# Patient Record
Sex: Female | Born: 1985 | Race: Black or African American | Hispanic: No | Marital: Single | Smoking: Never smoker
Health system: Southern US, Community
[De-identification: ages and names within clinical notes are randomized; demographics above are authoritative.]

## PROBLEM LIST (undated history)

## (undated) ENCOUNTER — Inpatient Hospital Stay: Payer: Self-pay

## (undated) DIAGNOSIS — R51 Headache: Secondary | ICD-10-CM

## (undated) DIAGNOSIS — F410 Panic disorder [episodic paroxysmal anxiety] without agoraphobia: Secondary | ICD-10-CM

## (undated) DIAGNOSIS — Z87440 Personal history of urinary (tract) infections: Secondary | ICD-10-CM

## (undated) DIAGNOSIS — K429 Umbilical hernia without obstruction or gangrene: Secondary | ICD-10-CM

## (undated) DIAGNOSIS — R519 Headache, unspecified: Secondary | ICD-10-CM

## (undated) HISTORY — PX: DILATION AND CURETTAGE OF UTERUS: SHX78

## (undated) HISTORY — DX: Personal history of urinary (tract) infections: Z87.440

---

## 2006-09-04 ENCOUNTER — Emergency Department: Payer: Self-pay | Admitting: Emergency Medicine

## 2006-09-07 ENCOUNTER — Ambulatory Visit: Payer: Self-pay | Admitting: Emergency Medicine

## 2008-07-09 ENCOUNTER — Observation Stay: Payer: Self-pay | Admitting: Obstetrics and Gynecology

## 2008-11-30 ENCOUNTER — Inpatient Hospital Stay: Payer: Self-pay | Admitting: Obstetrics and Gynecology

## 2008-12-30 ENCOUNTER — Emergency Department: Payer: Self-pay | Admitting: Emergency Medicine

## 2009-03-05 ENCOUNTER — Emergency Department: Payer: Self-pay | Admitting: Internal Medicine

## 2009-03-05 ENCOUNTER — Ambulatory Visit: Payer: Self-pay | Admitting: Internal Medicine

## 2011-12-21 ENCOUNTER — Emergency Department: Payer: Self-pay | Admitting: Emergency Medicine

## 2013-02-16 ENCOUNTER — Ambulatory Visit: Payer: Self-pay | Admitting: General Practice

## 2013-04-25 ENCOUNTER — Ambulatory Visit: Payer: Self-pay | Admitting: Gastroenterology

## 2013-08-30 ENCOUNTER — Emergency Department: Payer: Self-pay | Admitting: Emergency Medicine

## 2014-01-13 ENCOUNTER — Emergency Department: Payer: Self-pay | Admitting: Emergency Medicine

## 2015-02-08 ENCOUNTER — Emergency Department
Admission: EM | Admit: 2015-02-08 | Discharge: 2015-02-08 | Disposition: A | Discharge status: Home / Self Care | Payer: No Typology Code available for payment source | Attending: Emergency Medicine | Admitting: Emergency Medicine

## 2015-02-08 DIAGNOSIS — Y9389 Activity, other specified: Secondary | ICD-10-CM | POA: Insufficient documentation

## 2015-02-08 DIAGNOSIS — S6991XA Unspecified injury of right wrist, hand and finger(s), initial encounter: Secondary | ICD-10-CM | POA: Insufficient documentation

## 2015-02-08 DIAGNOSIS — S0990XA Unspecified injury of head, initial encounter: Secondary | ICD-10-CM | POA: Insufficient documentation

## 2015-02-08 DIAGNOSIS — Y998 Other external cause status: Secondary | ICD-10-CM | POA: Diagnosis not present

## 2015-02-08 DIAGNOSIS — Y92410 Unspecified street and highway as the place of occurrence of the external cause: Secondary | ICD-10-CM | POA: Insufficient documentation

## 2015-02-08 MED ORDER — METHOCARBAMOL 750 MG PO TABS: 1500.0000 mg | ORAL_TABLET | Freq: Four times a day (QID) | ORAL | Status: DC

## 2015-02-08 MED ORDER — IBUPROFEN 800 MG PO TABS
800.0000 mg | ORAL_TABLET | Freq: Three times a day (TID) | ORAL | Status: DC | PRN
Start: 2015-02-08 — End: 2015-06-27

## 2015-02-08 MED ORDER — OXYCODONE-ACETAMINOPHEN 5-325 MG PO TABS: 1.0000 | ORAL_TABLET | ORAL | Status: AC | PRN

## 2015-02-08 NOTE — Discharge Instructions (Signed)
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
## 2015-02-08 NOTE — ED Notes (Signed)
Patient with no complaints at this time. Respirations even and unlabored. Skin warm/dry. Discharge instructions reviewed with patient at this time. Patient given opportunity to voice concerns/ask questions. Patient discharged at this time and left Emergency Department with steady gait.   

## 2015-02-12 ENCOUNTER — Encounter: Payer: Self-pay | Admitting: Emergency Medicine

## 2015-02-12 ENCOUNTER — Emergency Department
Admission: EM | Admit: 2015-02-12 | Discharge: 2015-02-12 | Disposition: A | Discharge status: Home / Self Care | Payer: No Typology Code available for payment source | Attending: Emergency Medicine | Admitting: Emergency Medicine

## 2015-02-12 ENCOUNTER — Emergency Department: Payer: No Typology Code available for payment source

## 2015-02-12 DIAGNOSIS — R51 Headache: Secondary | ICD-10-CM | POA: Diagnosis present

## 2015-02-12 DIAGNOSIS — Z791 Long term (current) use of non-steroidal anti-inflammatories (NSAID): Secondary | ICD-10-CM | POA: Diagnosis not present

## 2015-02-12 DIAGNOSIS — G44209 Tension-type headache, unspecified, not intractable: Secondary | ICD-10-CM | POA: Insufficient documentation

## 2015-02-12 MED ORDER — HYDROCODONE-ACETAMINOPHEN 5-325 MG PO TABS: 1.0000 | ORAL_TABLET | ORAL | Status: DC | PRN

## 2015-02-12 MED ORDER — CYCLOBENZAPRINE HCL 10 MG PO TABS: 10.0000 mg | ORAL_TABLET | Freq: Three times a day (TID) | ORAL | Status: DC | PRN

## 2015-02-12 MED ORDER — NAPROXEN SODIUM 550 MG PO TABS: 550.0000 mg | ORAL_TABLET | Freq: Two times a day (BID) | ORAL | Status: DC
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
## 2015-02-12 NOTE — Discharge Instructions (Signed)
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
## 2015-06-27 ENCOUNTER — Encounter: Payer: Self-pay | Admitting: *Deleted

## 2015-06-27 ENCOUNTER — Encounter: Payer: Self-pay | Admitting: Certified Nurse Midwife

## 2015-06-27 ENCOUNTER — Other Ambulatory Visit (HOSPITAL_COMMUNITY)
Admission: RE | Admit: 2015-06-27 | Discharge: 2015-06-27 | Disposition: A | Discharge status: Home / Self Care | Payer: Medicaid Other | Source: Ambulatory Visit | Attending: Certified Nurse Midwife | Admitting: Certified Nurse Midwife

## 2015-06-27 ENCOUNTER — Ambulatory Visit (INDEPENDENT_AMBULATORY_CARE_PROVIDER_SITE_OTHER): Payer: Medicaid Other | Admitting: Certified Nurse Midwife

## 2015-06-27 VITALS — BP 117/70 | HR 77 | Wt 148.0 lb

## 2015-06-27 DIAGNOSIS — Z3481 Encounter for supervision of other normal pregnancy, first trimester: Secondary | ICD-10-CM

## 2015-06-27 DIAGNOSIS — Z01419 Encounter for gynecological examination (general) (routine) without abnormal findings: Secondary | ICD-10-CM | POA: Diagnosis not present

## 2015-06-27 DIAGNOSIS — Z113 Encounter for screening for infections with a predominantly sexual mode of transmission: Secondary | ICD-10-CM | POA: Diagnosis present

## 2015-06-27 DIAGNOSIS — Z3491 Encounter for supervision of normal pregnancy, unspecified, first trimester: Secondary | ICD-10-CM

## 2015-06-27 LAB — HM PAP SMEAR: HM PAP: NORMAL

## 2015-06-27 NOTE — Progress Notes (Signed)
Bedside ultrasound shows 7224w3d fetus with heartbeat today.

## 2015-06-27 NOTE — Progress Notes (Signed)
   Subjective:    Pamela [name] is a G3P1011 3261w2d being seen today for her first obstetrical visit.  Her obstetrical history is significant for previous c/s. Patient does intend to breast feed. Pregnancy history fully reviewed.  Patient reports no complaints.  Filed Vitals:   06/27/15 1334  BP: 117/70  Pulse: 77  Weight: 148 lb (67.132 kg)    HISTORY: OB History  Gravida Para Term Preterm AB SAB TAB Ectopic Multiple Living  3 1 1  1  1   1     # Outcome Date GA Lbr Len/2nd Weight Sex Delivery Anes PTL Lv  3 Current           2 Term 12/01/08 6017w0d  8 lb 11 oz (3.941 kg) F CS-LTranv EPI  Y  1 TAB              History reviewed. No pertinent past medical history. Past Surgical History  Procedure Laterality Date  . Cesarean section     History reviewed. No pertinent family history.   Exam    Uterus:     Pelvic Exam:    Perineum: No Hemorrhoids   Vulva: normal   Vagina:  normal mucosa   pH:    Cervix: multiparous appearance   Adnexa: normal adnexa   Bony Pelvis: gynecoid  System: Breast:  normal appearance, no masses or tenderness   Skin: normal coloration and turgor, no rashes    Neurologic: oriented, normal   Extremities: normal strength, tone, and muscle mass   HEENT    Mouth/Teeth mucous membranes moist, pharynx normal without lesions and dental hygiene good   Neck supple and no masses   Cardiovascular: regular rate and rhythm   Respiratory:  appears well, vitals normal, no respiratory distress, acyanotic, normal RR, ear and throat exam is normal, neck free of mass or lymphadenopathy, chest clear, no wheezing, crepitations, rhonchi, normal symmetric air entry   Abdomen: soft, non-tender; bowel sounds normal; no masses,  no organomegaly   Urinary: urethral meatus normal      Assessment:    Pregnancy: G3P1011 There are no active problems to display for this patient.    Previous C/S Supervision of other normal pregnancy Considering TOLAC   Plan:     Initial labs drawn. Prenatal vitamins. Problem list reviewed and updated. Genetic Screening discussed First Screen: requested.  Ultrasound discussed; fetal survey: requested.  Follow up in 4 weeks. 50% of 30 min visit spent on counseling and coordination of care.  Sign up for Baby Scripts   MedtronicClemmons,Hooria Gasparini Grissett 06/27/2015

## 2015-06-27 NOTE — Patient Instructions (Signed)
First Trimester of Pregnancy The first trimester of pregnancy is from week 1 until the end of week 12 (months 1 through 3). A week after a sperm fertilizes an egg, the egg will implant on the wall of the uterus. This embryo will begin to develop into a baby. Genes from you and your partner are forming the baby. The female genes determine whether the baby is a boy or a girl. At 6-8 weeks, the eyes and face are formed, and the heartbeat can be seen on ultrasound. At the end of 12 weeks, all the baby's organs are formed.  Now that you are pregnant, you will want to do everything you can to have a healthy baby. Two of the most important things are to get good prenatal care and to follow your health care provider's instructions. Prenatal care is all the medical care you receive before the baby's birth. This care will help prevent, find, and treat any problems during the pregnancy and childbirth. BODY CHANGES Your body goes through many changes during pregnancy. The changes vary from woman to woman.   You may gain or lose a couple of pounds at first.  You may feel sick to your stomach (nauseous) and throw up (vomit). If the vomiting is uncontrollable, call your health care provider.  You may tire easily.  You may develop headaches that can be relieved by medicines approved by your health care provider.  You may urinate more often. Painful urination may mean you have a bladder infection.  You may develop heartburn as a result of your pregnancy.  You may develop constipation because certain hormones are causing the muscles that push waste through your intestines to slow down.  You may develop hemorrhoids or swollen, bulging veins (varicose veins).  Your breasts may begin to grow larger and become tender. Your nipples may stick out more, and the tissue that surrounds them (areola) may become darker.  Your gums may bleed and may be sensitive to brushing and flossing.  Dark spots or blotches (chloasma,  mask of pregnancy) may develop on your face. This will likely fade after the baby is born.  Your menstrual periods will stop.  You may have a loss of appetite.  You may develop cravings for certain kinds of food.  You may have changes in your emotions from day to day, such as being excited to be pregnant or being concerned that something may go wrong with the pregnancy and baby.  You may have more vivid and strange dreams.  You may have changes in your hair. These can include thickening of your hair, rapid growth, and changes in texture. Some women also have hair loss during or after pregnancy, or hair that feels dry or thin. Your hair will most likely return to normal after your baby is born. WHAT TO EXPECT AT YOUR PRENATAL VISITS During a routine prenatal visit:  You will be weighed to make sure you and the baby are growing normally.  Your blood pressure will be taken.  Your abdomen will be measured to track your baby's growth.  The fetal heartbeat will be listened to starting around week 10 or 12 of your pregnancy.  Test results from any previous visits will be discussed. Your health care provider may ask you:  How you are feeling.  If you are feeling the baby move.  If you have had any abnormal symptoms, such as leaking fluid, bleeding, severe headaches, or abdominal cramping.  If you are using any tobacco products,   including cigarettes, chewing tobacco, and electronic cigarettes.  If you have any questions. Other tests that may be performed during your first trimester include:  Blood tests to find your blood type and to check for the presence of any previous infections. They will also be used to check for low iron levels (anemia) and Rh antibodies. Later in the pregnancy, blood tests for diabetes will be done along with other tests if problems develop.  Urine tests to check for infections, diabetes, or protein in the urine.  An ultrasound to confirm the proper growth  and development of the baby.  An amniocentesis to check for possible genetic problems.  Fetal screens for spina bifida and Down syndrome.  You may need other tests to make sure you and the baby are doing well.  HIV (human immunodeficiency virus) testing. Routine prenatal testing includes screening for HIV, unless you choose not to have this test. HOME CARE INSTRUCTIONS  Medicines  Follow your health care provider's instructions regarding medicine use. Specific medicines may be either safe or unsafe to take during pregnancy.  Take your prenatal vitamins as directed.  If you develop constipation, try taking a stool softener if your health care provider approves. Diet  Eat regular, well-balanced meals. Choose a variety of foods, such as meat or vegetable-based protein, fish, milk and low-fat dairy products, vegetables, fruits, and whole grain breads and cereals. Your health care provider will help you determine the amount of weight gain that is right for you.  Avoid raw meat and uncooked cheese. These carry germs that can cause birth defects in the baby.  Eating four or five small meals rather than three large meals a day may help relieve nausea and vomiting. If you start to feel nauseous, eating a few soda crackers can be helpful. Drinking liquids between meals instead of during meals also seems to help nausea and vomiting.  If you develop constipation, eat more high-fiber foods, such as fresh vegetables or fruit and whole grains. Drink enough fluids to keep your urine clear or pale yellow. Activity and Exercise  Exercise only as directed by your health care provider. Exercising will help you:  Control your weight.  Stay in shape.  Be prepared for labor and delivery.  Experiencing pain or cramping in the lower abdomen or low back is a good sign that you should stop exercising. Check with your health care provider before continuing normal exercises.  Try to avoid standing for long  periods of time. Move your legs often if you must stand in one place for a long time.  Avoid heavy lifting.  Wear low-heeled shoes, and practice good posture.  You may continue to have sex unless your health care provider directs you otherwise. Relief of Pain or Discomfort  Wear a good support bra for breast tenderness.   Take warm sitz baths to soothe any pain or discomfort caused by hemorrhoids. Use hemorrhoid cream if your health care provider approves.   Rest with your legs elevated if you have leg cramps or low back pain.  If you develop varicose veins in your legs, wear support hose. Elevate your feet for 15 minutes, 3-4 times a day. Limit salt in your diet. Prenatal Care  Schedule your prenatal visits by the twelfth week of pregnancy. They are usually scheduled monthly at first, then more often in the last 2 months before delivery.  Write down your questions. Take them to your prenatal visits.  Keep all your prenatal visits as directed by your   health care provider. Safety  Wear your seat belt at all times when driving.  Make a list of emergency phone numbers, including numbers for family, friends, the hospital, and police and fire departments. General Tips  Ask your health care provider for a referral to a local prenatal education class. Begin classes no later than at the beginning of month 6 of your pregnancy.  Ask for help if you have counseling or nutritional needs during pregnancy. Your health care provider can offer advice or refer you to specialists for help with various needs.  Do not use hot tubs, steam rooms, or saunas.  Do not douche or use tampons or scented sanitary pads.  Do not cross your legs for long periods of time.  Avoid cat litter boxes and soil used by cats. These carry germs that can cause birth defects in the baby and possibly loss of the fetus by miscarriage or stillbirth.  Avoid all smoking, herbs, alcohol, and medicines not prescribed by  your health care provider. Chemicals in these affect the formation and growth of the baby.  Do not use any tobacco products, including cigarettes, chewing tobacco, and electronic cigarettes. If you need help quitting, ask your health care provider. You may receive counseling support and other resources to help you quit.  Schedule a dentist appointment. At home, brush your teeth with a soft toothbrush and be gentle when you floss. SEEK MEDICAL CARE IF:   You have dizziness.  You have mild pelvic cramps, pelvic pressure, or nagging pain in the abdominal area.  You have persistent nausea, vomiting, or diarrhea.  You have a bad smelling vaginal discharge.  You have pain with urination.  You notice increased swelling in your face, hands, legs, or ankles. SEEK IMMEDIATE MEDICAL CARE IF:   You have a fever.  You are leaking fluid from your vagina.  You have spotting or bleeding from your vagina.  You have severe abdominal cramping or pain.  You have rapid weight gain or loss.  You vomit blood or material that looks like coffee grounds.  You are exposed to German measles and have never had them.  You are exposed to fifth disease or chickenpox.  You develop a severe headache.  You have shortness of breath.  You have any kind of trauma, such as from a fall or a car accident.   This information is not intended to replace advice given to you by your health care provider. Make sure you discuss any questions you have with your health care provider.   Document Released: 08/12/2001 Document Revised: 09/08/2014 Document Reviewed: 06/28/2013 [employer]vier Interactive Patient Education 2016 [employer]  

## 2015-06-28 LAB — PRENATAL PROFILE (SOLSTAS)
Antibody Screen: NEGATIVE
Basophils Absolute: 0 10*3/uL (ref 0.0–0.1)
Basophils Relative: 0 % (ref 0–1)
Eosinophils Absolute: 0.1 10*3/uL (ref 0.0–0.7)
Eosinophils Relative: 1 % (ref 0–5)
HCT: 37.9 % (ref 36.0–46.0)
HIV 1&2 Ab, 4th Generation: NONREACTIVE
Hemoglobin: 12.2 g/dL (ref 12.0–15.0)
Hepatitis B Surface Ag: NEGATIVE
Lymphocytes Relative: 30 % (ref 12–46)
Lymphs Abs: 2.4 10*3/uL (ref 0.7–4.0)
MCH: 29.5 pg (ref 26.0–34.0)
MCHC: 32.2 g/dL (ref 30.0–36.0)
MCV: 91.5 fL (ref 78.0–100.0)
MPV: 9.7 fL (ref 8.6–12.4)
Monocytes Absolute: 0.7 10*3/uL (ref 0.1–1.0)
Monocytes Relative: 9 % (ref 3–12)
Neutro Abs: 4.9 10*3/uL (ref 1.7–7.7)
Neutrophils Relative %: 60 % (ref 43–77)
Platelets: 266 10*3/uL (ref 150–400)
RBC: 4.14 MIL/uL (ref 3.87–5.11)
RDW: 13.3 % (ref 11.5–15.5)
Rh Type: POSITIVE
Rubella: 6.25 Index — ABNORMAL HIGH (ref <0.90)
WBC: 8.1 10*3/uL (ref 4.0–10.5)

## 2015-06-28 LAB — CULTURE, OB URINE
Colony Count: NO GROWTH
Organism ID, Bacteria: NO GROWTH

## 2015-06-29 LAB — CYTOLOGY - PAP

## 2015-07-25 ENCOUNTER — Inpatient Hospital Stay (HOSPITAL_COMMUNITY)
Admission: AD | Admit: 2015-07-25 | Discharge: 2015-07-25 | Disposition: A | Discharge status: Home / Self Care | Payer: Medicaid Other | Source: Ambulatory Visit | Attending: Family Medicine | Admitting: Family Medicine

## 2015-07-25 ENCOUNTER — Inpatient Hospital Stay (HOSPITAL_COMMUNITY): Payer: Medicaid Other

## 2015-07-25 ENCOUNTER — Encounter (HOSPITAL_COMMUNITY): Payer: Self-pay | Admitting: *Deleted

## 2015-07-25 ENCOUNTER — Ambulatory Visit (INDEPENDENT_AMBULATORY_CARE_PROVIDER_SITE_OTHER): Payer: Medicaid Other | Admitting: Advanced Practice Midwife

## 2015-07-25 VITALS — BP 124/73 | HR 69 | Wt 146.0 lb

## 2015-07-25 DIAGNOSIS — Z3480 Encounter for supervision of other normal pregnancy, unspecified trimester: Secondary | ICD-10-CM | POA: Insufficient documentation

## 2015-07-25 DIAGNOSIS — IMO0002 Reserved for concepts with insufficient information to code with codable children: Secondary | ICD-10-CM

## 2015-07-25 DIAGNOSIS — O021 Missed abortion: Secondary | ICD-10-CM | POA: Diagnosis not present

## 2015-07-25 DIAGNOSIS — R109 Unspecified abdominal pain: Secondary | ICD-10-CM | POA: Diagnosis present

## 2015-07-25 DIAGNOSIS — Z3481 Encounter for supervision of other normal pregnancy, first trimester: Secondary | ICD-10-CM

## 2015-07-25 HISTORY — DX: Headache, unspecified: R51.9

## 2015-07-25 HISTORY — DX: Umbilical hernia without obstruction or gangrene: K42.9

## 2015-07-25 HISTORY — DX: Headache: R51

## 2015-07-25 LAB — CBC
HCT: 39.2 % (ref 36.0–46.0)
Hemoglobin: 13.3 g/dL (ref 12.0–15.0)
MCH: 30.6 pg (ref 26.0–34.0)
MCHC: 33.9 g/dL (ref 30.0–36.0)
MCV: 90.1 fL (ref 78.0–100.0)
PLATELETS: 243 10*3/uL (ref 150–400)
RBC: 4.35 MIL/uL (ref 3.87–5.11)
RDW: 13.6 % (ref 11.5–15.5)
WBC: 11.1 10*3/uL — ABNORMAL HIGH (ref 4.0–10.5)

## 2015-07-25 MED ORDER — HYDROCODONE-ACETAMINOPHEN 5-325 MG PO TABS: 1.0000 | ORAL_TABLET | Freq: Four times a day (QID) | ORAL | Status: DC | PRN

## 2015-07-25 MED ORDER — MISOPROSTOL 200 MCG PO TABS: 800.0000 ug | ORAL_TABLET | Freq: Once | ORAL | Status: DC

## 2015-07-25 MED ORDER — PROMETHAZINE HCL 12.5 MG PO TABS: 12.5000 mg | ORAL_TABLET | Freq: Four times a day (QID) | ORAL | Status: DC | PRN

## 2015-07-25 MED ORDER — IBUPROFEN 600 MG PO TABS: 600.0000 mg | ORAL_TABLET | Freq: Four times a day (QID) | ORAL | Status: DC | PRN

## 2015-07-25 NOTE — Discharge Instructions (Signed)
Incomplete Miscarriage A miscarriage is the sudden loss of an unborn baby (fetus) before the 20th week of pregnancy. In an incomplete miscarriage, parts of the fetus or placenta (afterbirth) remain in the body.  Having a miscarriage can be an emotional experience. Talk with your health care provider about any questions you may have about miscarrying, the grieving process, and your future pregnancy plans. CAUSES   Problems with the fetal chromosomes that make it impossible for the baby to develop normally. Problems with the baby's genes or chromosomes are most often the result of errors that occur by chance as the embryo divides and grows. The problems are not inherited from the parents.  Infection of the cervix or uterus.  Hormone problems.  Problems with the cervix, such as having an incompetent cervix. This is when the tissue in the cervix is not strong enough to hold the pregnancy.  Problems with the uterus, such as an abnormally shaped uterus, uterine fibroids, or congenital abnormalities.  Certain medical conditions.  Smoking, drinking alcohol, or taking illegal drugs.  Trauma. SYMPTOMS   Vaginal bleeding or spotting, with or without cramps or pain.  Pain or cramping in the abdomen or lower back.  Passing fluid, tissue, or blood clots from the vagina. DIAGNOSIS  Your health care provider will perform a physical exam. You may also have an ultrasound to confirm the miscarriage. Blood or urine tests may also be ordered. TREATMENT   Usually, a dilation and curettage (D&C) procedure is performed. During a D&C procedure, the cervix is widened (dilated) and any remaining fetal or placental tissue is gently removed from the uterus.  Antibiotic medicines are prescribed if there is an infection. Other medicines may be given to reduce the size of the uterus (contract) if there is a lot of bleeding.  If you have Rh negative blood and your baby was Rh positive, you will need a Rho (D)  immune globulin shot. This shot will protect any future baby from having Rh blood problems in future pregnancies.  You may be confined to bed rest. This means you should stay in bed and only get up to use the bathroom. HOME CARE INSTRUCTIONS   Rest as directed by your health care provider.  Restrict activity as directed by your health care provider. You may be allowed to continue light activity if curettage was not done but you require further treatment.  Keep track of the number of pads you use each day. Keep track of how soaked (saturated) they are. Record this information.  Do not  use tampons.  Do not douche or have sexual intercourse until approved by your health care provider.  Keep all follow-up appointments for reevaluation and continuing management.  Only take over-the-counter or prescription medicines for pain, fever, or discomfort as directed by your health care provider.  Take antibiotic medicine as directed by your health care provider. Make sure you finish it even if you start to feel better. SEEK IMMEDIATE MEDICAL CARE IF:   You experience severe cramps in your stomach, back, or abdomen.  You have an unexplained temperature (make sure to record these temperatures).  You pass large clots or tissue (save these for your health care provider to inspect).  Your bleeding increases.  You become light-headed, weak, or have fainting episodes. MAKE SURE YOU:   Understand these instructions.  Will watch your condition.  Will get help right away if you are not doing well or get worse.   This information is not intended to   replace advice given to you by your health care provider. Make sure you discuss any questions you have with your health care provider.   Document Released: 08/18/2005 Document Revised: 09/08/2014 Document Reviewed: 03/17/2013 Elsevier Interactive Patient Education 2016 [employer]vier [employer]  

## 2015-07-25 NOTE — MAU Provider Note (Signed)
History     CSN: [id number]  Arrival date and time: 07/25/15 1428   First Provider Initiated Contact with Patient 07/25/15 1511      Chief Complaint  Patient presents with  . Abdominal Pain   HPI Pamela [name] 29 y.o. [id number] @[redacted]w[redacted]d  presents for eval after a clinic visit today.  Her provider was unable to detect heart tones and she needs eval here for confirmation.  She denies vaginal bleeding, abdominal pain, fever.  She last ate at 1:10pm.  OB History    Gravida Para Term Preterm AB TAB SAB Ectopic Multiple Living   3 1 1  1 1    1       Past Medical History  Diagnosis Date  . Headache   . Umbilical hernia     Past Surgical History  Procedure Laterality Date  . Cesarean section      Family History  Problem Relation Age of Onset  . Alcohol abuse Neg Hx   . Arthritis Neg Hx   . Asthma Neg Hx   . Birth defects Neg Hx   . Cancer Neg Hx   . COPD Neg Hx   . Depression Neg Hx   . Diabetes Neg Hx   . Drug abuse Neg Hx   . Early death Neg Hx   . Hearing loss Neg Hx   . Heart disease Neg Hx   . Hyperlipidemia Neg Hx   . Hypertension Neg Hx   . Kidney disease Neg Hx   . Learning disabilities Neg Hx   . Mental illness Neg Hx   . Mental retardation Neg Hx   . Miscarriages / Stillbirths Neg Hx   . Stroke Neg Hx   . Vision loss Neg Hx   . Varicose Veins Neg Hx     Social History  Substance Use Topics  . Smoking status: Never Smoker   . Smokeless tobacco: None  . Alcohol Use: Yes    Allergies:  Allergies  Allergen Reactions  . Tramadol Itching    Prescriptions prior to admission  Medication Sig Dispense Refill Last Dose  . Prenatal Vit-Fe Fumarate-FA (MULTIVITAMIN-PRENATAL) 27-0.8 MG TABS tablet Take 1 tablet by mouth daily at 12 noon.   07/24/2015 at Unknown time    ROS Pertinent ROS in HPI.  All other systems are negative.    Physical Exam   Blood pressure 135/79, pulse 79, temperature 99.3 F (37.4 C), resp. rate 18, last menstrual period  04/30/2015.  Physical Exam  Constitutional: She is oriented to person, place, and time. She appears well-developed and well-nourished. No distress.  HENT:  Head: Normocephalic and atraumatic.  Cardiovascular: Normal rate and normal heart sounds.   Respiratory: Effort normal. No respiratory distress.  GI: Soft. Bowel sounds are normal. She exhibits no distension. There is no tenderness.  Musculoskeletal: Normal range of motion. She exhibits no edema.  Neurological: She is alert and oriented to person, place, and time.  Skin: Skin is warm and dry.  Psychiatric: She has a normal mood and affect. Her behavior is normal.   [place] Ob Comp Less 14 Wks  07/25/2015  CLINICAL DATA:  Intermittent abdominal pain. EXAM: OBSTETRIC <14 WK US AND TRANSVAGINAL OB US TECHNIQUE: Both transabdominal and transvaginal ultrasound examinations were performed for complete evaluation of the gestation as well as the maternal uterus, adnexal regions, and pelvic cul-de-sac. Transvaginal technique was performed to assess early pregnancy. COMPARISON:  None. FINDINGS: Intrauterine gestational sac: Visualized/normal in shape. Yolk sac:  Visualized Embryo:  Visualized Cardiac Activity: Not visualized Heart Rate:   bpm MSD: 45.8  mm   10 w   1  d CRL:  17.9  mm   8 w   2 d                  [place] EDC: 03/03/2016 Maternal uterus/adnexae: Small subchorionic hemorrhage. No free fluid or adnexal masses. Right corpus luteum cyst. IMPRESSION: Discordant mean sac size and crown-rump length. No fetal heart tones. Findings are suspicious but not yet definitive for failed pregnancy. Recommend follow-up US in 10-14 days for definitive diagnosis. This recommendation follows SRU consensus guidelines: Diagnostic Criteria for Nonviable Pregnancy Early in the First Trimester. Malva Limes Med 2013; 161:0960-45. Electronically Signed   By: Charlett Nose M.D.   On: 07/25/2015 16:15   US Ob Transvaginal  07/25/2015  CLINICAL DATA:  Intermittent abdominal pain.  EXAM: OBSTETRIC <14 WK [place] AND TRANSVAGINAL OB US TECHNIQUE: Both transabdominal and transvaginal ultrasound examinations were performed for complete evaluation of the gestation as well as the maternal uterus, adnexal regions, and pelvic cul-de-sac. Transvaginal technique was performed to assess early pregnancy. COMPARISON:  None. FINDINGS: Intrauterine gestational sac: Visualized/normal in shape. Yolk sac:  Visualized Embryo:  Visualized Cardiac Activity: Not visualized Heart Rate:   bpm MSD: 45.8  mm   10 w   1  d CRL:  17.9  mm   8 w   2 d                  [place] EDC: 03/03/2016 Maternal uterus/adnexae: Small subchorionic hemorrhage. No free fluid or adnexal masses. Right corpus luteum cyst. IMPRESSION: Discordant mean sac size and crown-rump length. No fetal heart tones. Findings are suspicious but not yet definitive for failed pregnancy. Recommend follow-up US in 10-14 days for definitive diagnosis. This recommendation follows SRU consensus guidelines: Diagnostic Criteria for Nonviable Pregnancy Early in the First Trimester. Malva Limes Med 2013; 409:8119-14. Electronically Signed   By: Charlett Nose M.D.   On: 07/25/2015 16:15   MAU Course  Procedures  MDM Previous u/s at [facility] office on 10/26 showed fetus at 7 weeks, 3 days with cardiac activity.  No cardiac activity in office today.   U/S ordered to confirm.  Fetus had not had sufficient growth and no cardiac activity.  Discussed with Dr. Shawnie Pons whom agrees this is failed pregnancy.  Pt to be offered expectant management vs. Cytotec.  Pt given opportunity to ask questions and decides for Cytotec.   CBC ordered to confirm hemodynamically stable.    Assessment and Plan  A: Missed AB  P: Discharge to home     Early Intrauterine Pregnancy Failure  _x__  Documented intrauterine pregnancy failure less than or equal to [redacted] weeks gestation  _x__  No serious current illness  ___  Baseline Hgb greater than or equal to 10g/dl  _x__  Patient has  easily accessible transportation to the hospital  _x__  Clear preference  _x__  Practitioner/physician deems patient reliable  _x__  Counseling by practitioner or physician  _n/a__  Rho-Gam given by RN if indicated  ___ Medication dispensed   __x_   Cytotec 800 mcg  _x_   Intravaginally by patient at home         __   Intravaginally by RN in MAU        __   Rectally by patient at home        __   Rectally by  RN in MAU  __x_  Ibuprofen 600 mg 1 tablet by mouth every 6 hours as needed #30  __x_  Hydrocodone/acetaminophen 5/325 mg by mouth every 4 to 6 hours as needed  _x__  Phenergan 12.5 mg by mouth every 4 hours as needed for nausea  F/u at [facility] office in 2 weeks Patient may return to MAU as needed or if her condition were to change or worsen   Bertram Denver 07/25/2015, 3:12 PM

## 2015-07-25 NOTE — Progress Notes (Signed)
No fetal heart tones by doppler, bedside US performed by RN and CNM, no visible heartbeat.  Pt sent to MAU for further evaluation.

## 2015-07-25 NOTE — MAU Note (Addendum)
C/o slight, intermittent abdominal pain throughout pregnancy; sent from Ob's office for u/s; pt in for regular check-up and no FHR was found; sent for u/s to confirm no FHR;

## 2015-07-30 ENCOUNTER — Telehealth: Payer: Self-pay | Admitting: *Deleted

## 2015-07-30 NOTE — Telephone Encounter (Signed)
Spoke to pt to follow-up to see how she was doing after her last visit in the office, pt states she has not passed anything naturally and that she is gonna contact the pharmacy today about getting the cytotec, wanted to see if it would be covered by her Medicaid and the cost of the medication.  Recommended her to contact pharmacy as soon as possible to take cytotec and call office after she started the medication so she could schedule a follow-up appt.  Pt acknowledged instructions.

## 2015-08-02 ENCOUNTER — Telehealth: Payer: Self-pay | Admitting: *Deleted

## 2015-08-02 DIAGNOSIS — O021 Missed abortion: Secondary | ICD-10-CM

## 2015-08-02 MED ORDER — MISOPROSTOL 200 MCG PO TABS: 800.0000 ug | ORAL_TABLET | Freq: Once | ORAL | Status: DC

## 2015-08-02 NOTE — Telephone Encounter (Signed)
-----   Message from Olevia BowensJacinda S Battle sent at 08/02/2015  9:25 AM EST ----- Regarding: Rx Advise Contact: [phone] Took medicine, hasn't had any cramping or bleeding, hasn't passed anything yet, wants to talk to someone about it

## 2015-08-02 NOTE — Telephone Encounter (Signed)
Spoke to pt, placed Cytotec vaginally last night.  Has had minimal cramping and no bleeding.  Spoke to Dr Jolayne Pantheronstant, said pt could try another dose of Cytotec or come in to discuss D&C.  Pt would like to try another dose of Cytotec and will take it by mouth this time.  Sent rx to pharmacy.  Pt to call back with any problems.

## 2015-08-03 ENCOUNTER — Telehealth: Payer: Self-pay | Admitting: *Deleted

## 2015-08-03 ENCOUNTER — Encounter (HOSPITAL_COMMUNITY): Payer: Self-pay

## 2015-08-03 NOTE — Telephone Encounter (Signed)
-----   Message from Olevia BowensJacinda S Battle sent at 08/03/2015  8:08 AM EST ----- Regarding: Advise Contact: [phone] Called this morning, took meds last night, still hasn't passed anything wants to know what do you

## 2015-08-03 NOTE — Telephone Encounter (Signed)
Spoke to Dr Marice Potterove about pt case, scheduled a D&C for Tuesday 08-07-15, gave pre-op instructions and NPO after MN.

## 2015-08-06 MED ORDER — DEXTROSE 5 % IV SOLN
200.0000 mg | INTRAVENOUS | Status: AC
  Administered 2015-08-07: 200 mg via INTRAVENOUS
  Filled 2015-08-06: qty 200

## 2015-08-07 ENCOUNTER — Encounter (HOSPITAL_COMMUNITY): Payer: Self-pay | Admitting: Emergency Medicine

## 2015-08-07 ENCOUNTER — Ambulatory Visit (HOSPITAL_COMMUNITY): Payer: Medicaid Other | Admitting: Anesthesiology

## 2015-08-07 ENCOUNTER — Ambulatory Visit (HOSPITAL_COMMUNITY)
Admission: RE | Admit: 2015-08-07 | Discharge: 2015-08-07 | Disposition: A | Discharge status: Home / Self Care | Payer: Medicaid Other | Source: Ambulatory Visit | Attending: Obstetrics & Gynecology | Admitting: Obstetrics & Gynecology

## 2015-08-07 ENCOUNTER — Encounter (HOSPITAL_COMMUNITY)
Admission: RE | Disposition: A | Discharge status: Home / Self Care | Payer: Self-pay | Source: Ambulatory Visit | Attending: Obstetrics & Gynecology

## 2015-08-07 DIAGNOSIS — O021 Missed abortion: Secondary | ICD-10-CM | POA: Insufficient documentation

## 2015-08-07 HISTORY — PX: DILATION AND EVACUATION: SHX1459

## 2015-08-07 SURGERY — DILATION AND EVACUATION, UTERUS
Anesthesia: Monitor Anesthesia Care

## 2015-08-07 MED ORDER — FENTANYL CITRATE (PF) 100 MCG/2ML IJ SOLN: 25.0000 ug | INTRAMUSCULAR | Status: DC | PRN

## 2015-08-07 MED ORDER — HYDROCODONE-ACETAMINOPHEN 5-325 MG PO TABS: 1.0000 | ORAL_TABLET | Freq: Four times a day (QID) | ORAL | Status: DC | PRN

## 2015-08-07 MED ORDER — BUPIVACAINE HCL (PF) 0.5 % IJ SOLN
INTRAMUSCULAR | Status: AC
  Filled 2015-08-07: qty 30

## 2015-08-07 MED ORDER — BUPIVACAINE HCL (PF) 0.5 % IJ SOLN
INTRAMUSCULAR | Status: DC | PRN
Start: 2015-08-07 — End: 2015-08-07
  Administered 2015-08-07: 20 mL

## 2015-08-07 MED ORDER — PROPOFOL 10 MG/ML IV BOLUS
INTRAVENOUS | Status: AC
  Filled 2015-08-07: qty 20

## 2015-08-07 MED ORDER — SCOPOLAMINE 1 MG/3DAYS TD PT72
MEDICATED_PATCH | TRANSDERMAL | Status: AC
  Administered 2015-08-07: 1.5 mg via TRANSDERMAL
  Filled 2015-08-07: qty 1

## 2015-08-07 MED ORDER — ONDANSETRON HCL 4 MG/2ML IJ SOLN: 4.0000 mg | Freq: Once | INTRAMUSCULAR | Status: DC | PRN

## 2015-08-07 MED ORDER — FENTANYL CITRATE (PF) 100 MCG/2ML IJ SOLN
INTRAMUSCULAR | Status: DC | PRN
  Administered 2015-08-07: 100 ug via INTRAVENOUS

## 2015-08-07 MED ORDER — ONDANSETRON HCL 4 MG/2ML IJ SOLN
INTRAMUSCULAR | Status: AC
  Filled 2015-08-07: qty 2

## 2015-08-07 MED ORDER — ONDANSETRON HCL 4 MG/2ML IJ SOLN
INTRAMUSCULAR | Status: DC | PRN
  Administered 2015-08-07: 4 mg via INTRAVENOUS

## 2015-08-07 MED ORDER — MIDAZOLAM HCL 2 MG/2ML IJ SOLN
INTRAMUSCULAR | Status: DC | PRN
  Administered 2015-08-07: 1 mg via INTRAVENOUS

## 2015-08-07 MED ORDER — LIDOCAINE HCL (CARDIAC) 20 MG/ML IV SOLN
INTRAVENOUS | Status: DC | PRN
  Administered 2015-08-07: 100 mg via INTRAVENOUS

## 2015-08-07 MED ORDER — SCOPOLAMINE 1 MG/3DAYS TD PT72
1.0000 | MEDICATED_PATCH | Freq: Once | TRANSDERMAL | Status: DC
  Administered 2015-08-07: 1.5 mg via TRANSDERMAL

## 2015-08-07 MED ORDER — KETOROLAC TROMETHAMINE 30 MG/ML IJ SOLN
INTRAMUSCULAR | Status: DC | PRN
  Administered 2015-08-07: 30 mg via INTRAVENOUS

## 2015-08-07 MED ORDER — LIDOCAINE HCL (CARDIAC) 20 MG/ML IV SOLN
INTRAVENOUS | Status: AC
  Filled 2015-08-07: qty 5

## 2015-08-07 MED ORDER — KETOROLAC TROMETHAMINE 30 MG/ML IJ SOLN: 30.0000 mg | Freq: Once | INTRAMUSCULAR | Status: DC | PRN

## 2015-08-07 MED ORDER — DEXAMETHASONE SODIUM PHOSPHATE 10 MG/ML IJ SOLN
INTRAMUSCULAR | Status: DC | PRN
  Administered 2015-08-07: 4 mg via INTRAVENOUS

## 2015-08-07 MED ORDER — CEFAZOLIN SODIUM-DEXTROSE 2-3 GM-% IV SOLR: 2.0000 g | INTRAVENOUS | Status: DC

## 2015-08-07 MED ORDER — PROPOFOL 10 MG/ML IV BOLUS
INTRAVENOUS | Status: DC | PRN
  Administered 2015-08-07 (×4): 20 mg via INTRAVENOUS

## 2015-08-07 MED ORDER — MIDAZOLAM HCL 2 MG/2ML IJ SOLN
INTRAMUSCULAR | Status: AC
  Filled 2015-08-07: qty 2

## 2015-08-07 MED ORDER — MEPERIDINE HCL 25 MG/ML IJ SOLN
6.2500 mg | INTRAMUSCULAR | Status: DC | PRN
Start: 2015-08-07 — End: 2015-08-07

## 2015-08-07 MED ORDER — FENTANYL CITRATE (PF) 250 MCG/5ML IJ SOLN
INTRAMUSCULAR | Status: AC
  Filled 2015-08-07: qty 5

## 2015-08-07 MED ORDER — CEFAZOLIN SODIUM-DEXTROSE 2-3 GM-% IV SOLR
INTRAVENOUS | Status: AC
  Filled 2015-08-07: qty 50

## 2015-08-07 MED ORDER — LACTATED RINGERS IV SOLN
INTRAVENOUS | Status: DC
  Administered 2015-08-07: 09:00:00 via INTRAVENOUS
  Administered 2015-08-07: 125 mL/h via INTRAVENOUS

## 2015-08-07 SURGICAL SUPPLY — 20 items
CATH ROBINSON RED A/P 16FR (CATHETERS) IMPLANT
CLOTH BEACON ORANGE TIMEOUT ST (SAFETY) ×3 IMPLANT
DECANTER SPIKE VIAL GLASS SM (MISCELLANEOUS) ×3 IMPLANT
GLOVE BIO SURGEON STRL SZ 6.5 (GLOVE) ×2 IMPLANT
GLOVE BIO SURGEONS STRL SZ 6.5 (GLOVE) ×1
GLOVE BIOGEL PI IND STRL 7.0 (GLOVE) ×1 IMPLANT
GLOVE BIOGEL PI INDICATOR 7.0 (GLOVE) ×2
GOWN STRL REUS W/TWL LRG LVL3 (GOWN DISPOSABLE) ×6 IMPLANT
KIT BERKELEY 1ST TRIMESTER 3/8 (MISCELLANEOUS) ×3 IMPLANT
NEEDLE SPNL 18GX3.5 QUINCKE PK (NEEDLE) ×3 IMPLANT
NS IRRIG 1000ML POUR BTL (IV SOLUTION) ×3 IMPLANT
PACK VAGINAL MINOR WOMEN LF (CUSTOM PROCEDURE TRAY) ×3 IMPLANT
PAD OB MATERNITY 4.3X12.25 (PERSONAL CARE ITEMS) ×3 IMPLANT
PAD PREP 24X48 CUFFED NSTRL (MISCELLANEOUS) ×3 IMPLANT
SET BERKELEY SUCTION TUBING (SUCTIONS) ×3 IMPLANT
TOWEL OR 17X24 6PK STRL BLUE (TOWEL DISPOSABLE) ×6 IMPLANT
VACURETTE 10 RIGID CVD (CANNULA) IMPLANT
VACURETTE 7MM CVD STRL WRAP (CANNULA) IMPLANT
VACURETTE 8 RIGID CVD (CANNULA) ×3 IMPLANT
VACURETTE 9 RIGID CVD (CANNULA) IMPLANT

## 2015-08-07 NOTE — H&P (Signed)
Pamela [name] is an 29 y.o. AA G3P1 who is here today for a suction d&c for a missed abortion. She was seen at the office at [redacted] weeks EGA but no FHTs were noted. She was then seen at the MAU for an u/s. This confirmed a fetal demise. She has taken 2 rounds of cytotec but has not passed any tissue. She would like a d&c.  Patient's last menstrual period was 04/30/2015.    Past Medical History  Diagnosis Date  . Headache   . Umbilical hernia     Past Surgical History  Procedure Laterality Date  . Cesarean section      Family History  Problem Relation Age of Onset  . Alcohol abuse Neg Hx   . Arthritis Neg Hx   . Asthma Neg Hx   . Birth defects Neg Hx   . Cancer Neg Hx   . COPD Neg Hx   . Depression Neg Hx   . Diabetes Neg Hx   . Drug abuse Neg Hx   . Early death Neg Hx   . Hearing loss Neg Hx   . Heart disease Neg Hx   . Hyperlipidemia Neg Hx   . Hypertension Neg Hx   . Kidney disease Neg Hx   . Learning disabilities Neg Hx   . Mental illness Neg Hx   . Mental retardation Neg Hx   . Miscarriages / Stillbirths Neg Hx   . Stroke Neg Hx   . Vision loss Neg Hx   . Varicose Veins Neg Hx     Social History:  reports that she has never smoked. She has never used smokeless tobacco. She reports that she drinks alcohol. She reports that she does not use illicit drugs.  Allergies:  Allergies  Allergen Reactions  . Tramadol Itching    Prescriptions prior to admission  Medication Sig Dispense Refill Last Dose  . ibuprofen (ADVIL,MOTRIN) 600 MG tablet Take 1 tablet (600 mg total) by mouth every 6 (six) hours as needed. (Patient taking differently: Take 600 mg by mouth every 6 (six) hours as needed for headache or moderate pain. ) 30 tablet 0 Past Week at Unknown time  . HYDROcodone-acetaminophen (NORCO/VICODIN) 5-325 MG tablet Take 1-2 tablets by mouth every 6 (six) hours as needed for moderate pain. 10 tablet 0   . misoprostol (CYTOTEC) 200 MCG tablet Take 4 tablets (800 mcg  total) by mouth once. 4 tablet 0   . Prenatal Vit-Fe Fumarate-FA (MULTIVITAMIN-PRENATAL) 27-0.8 MG TABS tablet Take 1 tablet by mouth daily at 12 noon.   07/24/2015 at Unknown time  . promethazine (PHENERGAN) 12.5 MG tablet Take 1 tablet (12.5 mg total) by mouth every 6 (six) hours as needed for nausea or vomiting. (Patient not taking: Reported on 08/03/2015) 30 tablet 0     ROS  Blood pressure 140/77, pulse 80, temperature 97.9 F (36.6 C), temperature source Oral, resp. rate 16, height 5\' 8"  (1.727 m), weight 65.772 kg (145 lb), last menstrual period 04/30/2015, SpO2 100 %. Physical Exam  WNWHBFNAD Heart- rrr Lungs-  CTAB Abd- benign  No results found for this or any previous visit (from the past 24 hour(s)).  No results found.  Assessment/Plan: Missed ab- plan for d&c.  She understands the risks of surgery, including, but not to infection, bleeding, DVTs, damage to bowel, bladder, ureters. She wishes to proceed.     Mahathi Pokorney C. 08/07/2015, 10:28 AM

## 2015-08-07 NOTE — Op Note (Signed)
08/07/2015  10:54 AM  PATIENT:  Pamela [name]  29 y.o. female  PRE-OPERATIVE DIAGNOSIS:  Missed AB  POST-OPERATIVE DIAGNOSIS:  Missed AB  PROCEDURE:  Procedure(s): DILATATION AND EVACUATION (N/A)  SURGEON:  Surgeon(s) and Role:    * Allie BossierMyra C Willson Lipa, MD - Primary   ANESTHESIA:   general and paracervical block  EBL:  Total I/O In: 1000 [I.V.:1000] Out: 100 [Blood:100]  BLOOD ADMINISTERED:none  DRAINS: none   LOCAL MEDICATIONS USED:  MARCAINE     SPECIMEN:  Source of Specimen:  uterine curettings  DISPOSITION OF SPECIMEN:  Source of Specimen:  uterine curettings  COUNTS:  YES  TOURNIQUET:  * No tourniquets in log *  DICTATION: .Dragon Dictation  PLAN OF CARE: Discharge to home after PACU  PATIENT DISPOSITION:  PACU - hemodynamically stable.   Delay start of Pharmacological VTE agent (>24hrs) due to surgical blood loss or risk of bleeding: not applicable    The risks, benefits, and alternatives of surgery were explained, understood, and accepted. All questions were answered. Consents were signed. In the operating room  anesthesia was applied without complication, and she was placed in the dorsal lithotomy position. Her vagina was prepped and draped in the usual sterile fashion. A Robinson catheter was used to drain her bladder. A bimanual exam revealed a 6 week size , anteverted mobile uterus. Her adnexa were nonenlarged. A speculum was placed and a single-tooth tenaculum was used to grasp the anterior lip of her cervix. A total of 20 mL of 0.5% Marcaine was used to perform a paracervical block. Her uterus sounded to 10 cm. Her cervix was carefully and slowly dilated to accommodate a small curette. A #8 curved suction curette was used to empty the uterus. A large amount of products of conception were removed. A curettage was done in all quadrants and the fundus of the uterus with a sharp curette.  There was no bleeding noted at the end of the case. She was taken to the  recovery room after being extubated. She tolerated the procedure well.

## 2015-08-07 NOTE — Transfer of Care (Signed)
Immediate Anesthesia Transfer of Care Note  Patient: Pamela [name]  Procedure(s) Performed: Procedure(s): DILATATION AND EVACUATION (N/A)  Patient Location: PACU  Anesthesia Type:MAC  Level of Consciousness: awake, alert , oriented and patient cooperative  Airway & Oxygen Therapy: Patient Spontanous Breathing  Post-op Assessment: Report given to RN and Post -op Vital signs reviewed and stable  Post vital signs: Reviewed and stable  Last Vitals:  Filed Vitals:   08/07/15 0919  BP: 140/77  Pulse: 80  Temp: 36.6 C  Resp: 16    Complications: No apparent anesthesia complications

## 2015-08-07 NOTE — Discharge Instructions (Signed)
Dilation and Curettage or Vacuum Curettage, Care After Refer to this sheet in the next few weeks. These instructions provide you with information on caring for yourself after your procedure. Your health care provider may also give you more specific instructions. Your treatment has been planned according to current medical practices, but problems sometimes occur. Call your health care provider if you have any problems or questions after your procedure. WHAT TO EXPECT AFTER THE PROCEDURE After your procedure, it is typical to have light cramping and bleeding. This may last for 2 days to 2 weeks after the procedure. HOME CARE INSTRUCTIONS   Do not drive for 24 hours.  Wait 1 week before returning to strenuous activities.  Take your temperature 2 times a day for 4 days and write it down. Provide these temperatures to your health care provider if you develop a fever.  Avoid long periods of standing.  Avoid heavy lifting, pushing, or pulling. Do not lift anything heavier than 10 pounds (4.5 kg).  Limit stair climbing to once or twice a day.  Take rest periods often.  You may resume your usual diet.  Drink enough fluids to keep your urine clear or pale yellow.  Your usual bowel function should return. If you have constipation, you may:  Take a mild laxative with permission from your health care provider.  Add fruit and bran to your diet.  Drink more fluids.  Take showers instead of baths until your health care provider gives you permission to take baths.  Do not go swimming or use a hot tub until your health care provider approves.  Try to have someone with you or available to you the first 24-48 hours, especially if you were given a general anesthetic.  Do not douche, use tampons, or have sex (intercourse) for 2 weeks after the procedure.  Only take over-the-counter or prescription medicines as directed by your health care provider. Do not take aspirin. It can cause  bleeding.  Follow up with your health care provider as directed. SEEK MEDICAL CARE IF:   You have increasing cramps or pain that is not relieved with medicine.  You have abdominal pain that does not seem to be related to the same area of earlier cramping and pain.  You have bad smelling vaginal discharge.  You have a rash.  You are having problems with any medicine. SEEK IMMEDIATE MEDICAL CARE IF:   You have bleeding that is heavier than a normal menstrual period.  You have a fever.  You have chest pain.  You have shortness of breath.  You feel dizzy or feel like fainting.  You pass out.  You have pain in your shoulder strap area.  You have heavy vaginal bleeding with or without blood clots. MAKE SURE YOU:   Understand these instructions.  Will watch your condition.  Will get help right away if you are not doing well or get worse.   This information is not intended to replace advice given to you by your health care provider. Make sure you discuss any questions you have with your health care provider.   Document Released: 08/15/2000 Document Revised: 08/23/2013 Document Reviewed: 03/17/2013 Elsevier Interactive Patient Education 2016 [place]: D&C / D&E The following instructions have been prepared to help you care for yourself upon your return home.   Personal hygiene:  Use sanitary pads for vaginal drainage, not tampons.  Shower the day after your procedure.  NO tub baths, pools or Jacuzzis for 2-3 weeks.  Wipe front to back after using the bathroom.  Activity and limitations:  Do NOT drive or operate any equipment for 24 hours. The effects of anesthesia are still present and drowsiness may result.  Do NOT rest in bed all day.  Walking is encouraged.  Walk up and down stairs slowly.  You may resume your normal activity in one to two days or as indicated by your physician.  Sexual activity: NO intercourse for at least 2  weeks after the procedure, or as indicated by your physician.  Diet: Eat a light meal as desired this evening. You may resume your usual diet tomorrow.  Return to work: You may resume your work activities in one to two days or as indicated by your doctor.  What to expect after your surgery: Expect to have vaginal bleeding/discharge for 2-3 days and spotting for up to 10 days. It is not unusual to have soreness for up to 1-2 weeks. You may have a slight burning sensation when you urinate for the first day. Mild cramps may continue for a couple of days. You may have a regular period in 2-6 weeks.  Call your doctor for any of the following:  Excessive vaginal bleeding, saturating and changing one pad every hour.  Inability to urinate 6 hours after discharge from hospital.  Pain not relieved by pain medication.  Fever of 100.4 F or greater.  Unusual vaginal discharge or odor.   Call for an appointment:    Patients signature: ______________________  Nurses signature ________________________  Support person's signature_______________________   May take ibuprofen / motrin/  advil at 5PM Increase water intake and oral  Intake the next 48hours Can use heating pad

## 2015-08-07 NOTE — Anesthesia Preprocedure Evaluation (Signed)
Anesthesia Evaluation  Patient identified by MRN, date of birth, ID band Patient awake    Reviewed: Allergy & Precautions, H&P , NPO status , Patient's Chart, lab work & pertinent test results  Airway Mallampati: I  TM Distance: >3 FB Neck ROM: full    Dental no notable dental hx. (+) Teeth Intact   Pulmonary neg pulmonary ROS,    Pulmonary exam normal        Cardiovascular negative cardio ROS Normal cardiovascular exam     Neuro/Psych negative psych ROS   GI/Hepatic negative GI ROS, Neg liver ROS,   Endo/Other  negative endocrine ROS  Renal/GU negative Renal ROS     Musculoskeletal   Abdominal Normal abdominal exam  (+)   Peds  Hematology negative hematology ROS (+)   Anesthesia Other Findings   Reproductive/Obstetrics negative OB ROS                             Anesthesia Physical Anesthesia Plan  ASA: I  Anesthesia Plan: MAC   Post-op Pain Management:    Induction: Intravenous  Airway Management Planned: Simple Face Mask  Additional Equipment:   Intra-op Plan:   Post-operative Plan:   Informed Consent: I have reviewed the patients History and Physical, chart, labs and discussed the procedure including the risks, benefits and alternatives for the proposed anesthesia with the patient or authorized representative who has indicated his/her understanding and acceptance.     Plan Discussed with: CRNA and Surgeon  Anesthesia Plan Comments:         Anesthesia Quick Evaluation

## 2015-08-07 NOTE — Anesthesia Postprocedure Evaluation (Signed)
Anesthesia Post Note  Patient: Pamela [name]  Procedure(s) Performed: Procedure(s) (LRB): DILATATION AND EVACUATION (N/A)  Patient location during evaluation: PACU Anesthesia Type: General Level of consciousness: awake Pain management: pain level controlled Vital Signs Assessment: post-procedure vital signs reviewed and stable Respiratory status: spontaneous breathing Cardiovascular status: stable Postop Assessment: no signs of nausea or vomiting and adequate PO intake Anesthetic complications: no    Last Vitals:  Filed Vitals:   08/07/15 1145 08/07/15 1159  BP: 105/58   Pulse: 69 88  Temp:  36.9 C  Resp: 18 22    Last Pain:  Filed Vitals:   08/07/15 1200  PainSc: Asleep                 Keyandra Swenson JR,JOHN Landen Knoedler

## 2015-08-08 ENCOUNTER — Encounter (HOSPITAL_COMMUNITY): Payer: Self-pay | Admitting: Obstetrics & Gynecology

## 2015-08-14 ENCOUNTER — Ambulatory Visit: Payer: Medicaid Other | Admitting: Obstetrics & Gynecology

## 2015-09-11 ENCOUNTER — Ambulatory Visit (INDEPENDENT_AMBULATORY_CARE_PROVIDER_SITE_OTHER): Payer: Medicaid Other | Admitting: Family Medicine

## 2015-09-11 ENCOUNTER — Encounter: Payer: Self-pay | Admitting: Family Medicine

## 2015-09-11 VITALS — BP 128/70 | HR 66 | Wt 148.0 lb

## 2015-09-11 DIAGNOSIS — Z09 Encounter for follow-up examination after completed treatment for conditions other than malignant neoplasm: Secondary | ICD-10-CM

## 2015-09-11 MED ORDER — PRENATAL 27-0.8 MG PO TABS: 1.0000 | ORAL_TABLET | Freq: Every day | ORAL | Status: DC

## 2015-09-11 NOTE — Patient Instructions (Signed)
Preparing for Pregnancy Before trying to become pregnant, make an appointment with your health care provider (preconception care). The goal is to help you have a healthy, safe pregnancy. At your first appointment, your health care provider will:   Do a complete physical exam, including a Pap test.  Take a complete medical history.  Give you advice and help you resolve any problems. PRECONCEPTION CHECKLIST Here is a list of the basics to cover with your health care provider at your preconception visit:  Medical history.  Tell your health care provider about any diseases you have had. Many diseases can affect your pregnancy.  Include your partner's medical history and family history.  Make sure you have been tested for sexually transmitted infections (STIs). These can affect your pregnancy. In some cases, they can be passed to your baby. Tell your health care provider about any history of STIs.  Make sure your health care provider knows about any previous problems you have had with conception or pregnancy.  Tell your health care provider about any medicine you take. This includes herbal supplements and over-the-counter medicines.  Make sure all your immunizations are up to date. You may need to make additional appointments.  Ask your health care provider if you need any vaccinations or if there are any you should avoid.  Diet.  It is especially important to eat a healthy, balanced diet with the right nutrients when you are pregnant.  Ask your health care provider to help you get to a healthy weight before pregnancy.  If you are overweight, you are at higher risk for certain complications. These include high blood pressure, diabetes, and preterm birth.  If you are underweight, you are more likely to have a low-birth-weight baby.  Lifestyle.  Tell your health care provider about lifestyle factors such as alcohol use, drug use, or smoking.  Describe any harmful substances you may  be exposed to at work or home. These can include chemicals, pesticides, and radiation.  Mental health.  Let your health care provider know if you have been feeling depressed or anxious.  Let your health care provider know if you have a history of substance abuse.  Let your health care provider know if you do not feel safe at home. HOME INSTRUCTIONS TO PREPARE FOR PREGNANCY Follow your health care provider's advice and instructions.   Keep an accurate record of your menstrual periods. This makes it easier for your health care provider to determine your baby's due date.  Begin taking prenatal vitamins and folic acid supplements daily. Take them as directed by your health care provider.  Eat a balanced diet. Get help from a nutrition counselor if you have questions or need help.  Get regular exercise. Try to be active for at least 30 minutes a day most days of the week.  Quit smoking, if you smoke.  Do not drink alcohol.  Do not take illegal drugs.  Get medical problems, such as diabetes or high blood pressure, under control.  If you have diabetes, make sure you do the following:  Have good blood sugar control. If you have type 1 diabetes, use multiple daily doses of insulin. Do not use split-dose or premixed insulin.  Have an eye exam by a qualified eye care professional trained in caring for people with diabetes.  Get evaluated by your health care provider for cardiovascular disease.  Get to a healthy weight. If you are overweight or obese, reduce your weight with the help of a qualified health   professional such as a registered dietitian. Ask your health care provider what the right weight range is for you. HOW DO I KNOW I AM PREGNANT? You may be pregnant if you have been sexually active and you miss your period. Symptoms of early pregnancy include:   Mild cramping.  Very light vaginal bleeding (spotting).  Feeling unusually tired.  Morning sickness. If you have any of  these symptoms, take a home pregnancy test. These tests look for a hormone called human chorionic gonadotropin (hCG) in your urine. Your body begins to make this hormone during early pregnancy. These tests are very accurate. Wait until at least the first day you miss your period to take one. If you get a positive result, call your health care provider to make appointments for prenatal care. WHAT SHOULD I DO IF I BECOME PREGNANT?  Make an appointment with your health care provider by week 12 of your pregnancy at the latest.  Do not smoke. Smoking can be harmful to your baby.  Do not drink alcoholic beverages. Alcohol is related to a number of birth defects.  Avoid toxic odors and chemicals.  You may continue to have sexual intercourse if it does not cause pain or other problems, such as vaginal bleeding.   This information is not intended to replace advice given to you by your health care provider. Make sure you discuss any questions you have with your health care provider.   Document Released: 07/31/2008 Document Revised: 09/08/2014 Document Reviewed: 07/25/2013 Elsevier Interactive Patient Education 2016 [employer]  

## 2015-09-12 NOTE — Progress Notes (Signed)
   Subjective:    Patient ID: Pamela [name] is a 30 y.o. female presenting with Routine Post Op  on 09/11/2015  HPI: Here for post op check. 4 wks s/p D and C for Missed AB. No further bleeding.  She is sexually active, without protection. Denies pain or fever. Interested in getting pregnant again soon.  Review of Systems  Constitutional: Negative for fever and chills.  Respiratory: Negative for shortness of breath.   Cardiovascular: Negative for chest pain.  Gastrointestinal: Negative for nausea, vomiting and abdominal pain.  Genitourinary: Negative for dysuria.  Skin: Negative for rash.      Objective:    BP 128/70 mmHg  Pulse 66  Wt 148 lb (67.132 kg)  LMP 04/30/2015  Breastfeeding? Unknown Physical Exam  Constitutional: She is oriented to person, place, and time. She appears well-developed and well-nourished. No distress.  HENT:  Head: Normocephalic and atraumatic.  Eyes: No scleral icterus.  Neck: Neck supple.  Cardiovascular: Normal rate.   Pulmonary/Chest: Effort normal.  Abdominal: Soft.  Neurological: She is alert and oriented to person, place, and time.  Skin: Skin is warm and dry.  Psychiatric: She has a normal mood and affect.        Assessment & Plan:   Problem List Items Addressed This Visit    None    Visit Diagnoses    Postop check    -  Primary    Doing well. No cycle since surgery. Perform UPT in 2 wks if no cycle. Resume PNV's.        Pamela [name] S 09/12/2015 8:11 AM

## 2015-09-27 ENCOUNTER — Other Ambulatory Visit (INDEPENDENT_AMBULATORY_CARE_PROVIDER_SITE_OTHER): Payer: Medicaid Other | Admitting: *Deleted

## 2015-09-27 DIAGNOSIS — Z3201 Encounter for pregnancy test, result positive: Secondary | ICD-10-CM

## 2015-09-27 LAB — HCG, QUANTITATIVE, PREGNANCY: hCG, Beta Chain, Quant, S: 23.1 m[IU]/mL — ABNORMAL HIGH

## 2015-09-27 NOTE — Progress Notes (Signed)
Patient called and had a d and c in December and is still getting a positive pregnancy test.  Patient doesn't know if it could be coming from the previous miscarriage or a new pregnancy.  Beta HCG done today.

## 2015-10-04 ENCOUNTER — Other Ambulatory Visit (INDEPENDENT_AMBULATORY_CARE_PROVIDER_SITE_OTHER): Payer: Medicaid Other | Admitting: *Deleted

## 2015-10-04 DIAGNOSIS — Z3201 Encounter for pregnancy test, result positive: Secondary | ICD-10-CM

## 2015-10-04 NOTE — Progress Notes (Signed)
Patient here today for a beta hcg

## 2015-10-05 LAB — HCG, QUANTITATIVE, PREGNANCY: hCG, Beta Chain, Quant, S: 14.7 m[IU]/mL — ABNORMAL HIGH

## 2015-10-19 ENCOUNTER — Other Ambulatory Visit: Payer: Medicaid Other

## 2015-12-24 ENCOUNTER — Telehealth: Payer: Self-pay | Admitting: *Deleted

## 2015-12-24 NOTE — Telephone Encounter (Signed)
Pt called in stating ever since her D&C she does not have a normal flow cycle. Pt states vaginal bleeding only occurs when she uses the bathroom and then it's a large amount of blood that comes out. Pt states she does not bleed onto a pad, only when using the bathroom. Advised pt to come in for eval. Pt is now scheduled to see Dr. Marice Potterove who performed her procedure.

## 2015-12-26 ENCOUNTER — Encounter: Payer: Self-pay | Admitting: Obstetrics & Gynecology

## 2015-12-26 ENCOUNTER — Ambulatory Visit (INDEPENDENT_AMBULATORY_CARE_PROVIDER_SITE_OTHER): Payer: Medicaid Other | Admitting: Obstetrics & Gynecology

## 2015-12-26 VITALS — BP 118/76 | HR 62 | Resp 16 | Ht 68.0 in | Wt 145.0 lb

## 2015-12-26 DIAGNOSIS — N938 Other specified abnormal uterine and vaginal bleeding: Secondary | ICD-10-CM | POA: Diagnosis not present

## 2015-12-26 LAB — TSH: TSH: 1.68 m[IU]/L

## 2015-12-26 LAB — CBC
HCT: 41.9 % (ref 35.0–45.0)
Hemoglobin: 13.5 g/dL (ref 11.7–15.5)
MCH: 29 pg (ref 27.0–33.0)
MCHC: 32.2 g/dL (ref 32.0–36.0)
MCV: 89.9 fL (ref 80.0–100.0)
MPV: 9.4 fL (ref 7.5–12.5)
PLATELETS: 269 10*3/uL (ref 140–400)
RBC: 4.66 MIL/uL (ref 3.80–5.10)
RDW: 13.5 % (ref 11.0–15.0)
WBC: 5.5 10*3/uL (ref 3.8–10.8)

## 2015-12-26 NOTE — Progress Notes (Signed)
   Subjective:    Patient ID: Pamela [name], female    DOB: May 24, 1986, 30 y.o.   MRN: [id number]  HPI  30 yo AA G3P1A2 here because of menstrual irregularities since her d&c 12/16 for a missed ab. She describes a monthly period that lasts 5-7 days. It is so light that she only has to wear a panty liner. But when she sits on the toilet and pushes down ("like when having a baby"), she sees large blood clots.  Review of Systems She is trying to concieve.    Objective:   Physical Exam  Thin AAF, WNWH, NAD Breathing, conversing, and ambulating normally Abd- benign Cervix- bloody mucous seen NSSmid plane, NT, minimal mobility Normal adnexal exam      Assessment & Plan:  Check cbc, gyn u/s, QBHCG, and TSH Rec MVI daily to help prevent ONTDs

## 2015-12-29 LAB — BETA HCG QUANT (REF LAB)

## 2016-01-02 ENCOUNTER — Ambulatory Visit
Admission: RE | Admit: 2016-01-02 | Discharge: 2016-01-02 | Disposition: A | Discharge status: Home / Self Care | Payer: Medicaid Other | Source: Ambulatory Visit | Attending: Obstetrics & Gynecology | Admitting: Obstetrics & Gynecology

## 2016-01-02 DIAGNOSIS — N938 Other specified abnormal uterine and vaginal bleeding: Secondary | ICD-10-CM | POA: Insufficient documentation

## 2016-07-29 ENCOUNTER — Ambulatory Visit: Payer: Medicaid Other | Admitting: Family Medicine

## 2016-11-24 ENCOUNTER — Ambulatory Visit (INDEPENDENT_AMBULATORY_CARE_PROVIDER_SITE_OTHER): Payer: Self-pay | Admitting: Certified Nurse Midwife

## 2016-11-24 ENCOUNTER — Ambulatory Visit (INDEPENDENT_AMBULATORY_CARE_PROVIDER_SITE_OTHER): Payer: Medicaid Other

## 2016-11-24 VITALS — BP 112/64 | HR 72 | Ht 68.0 in | Wt 140.7 lb

## 2016-11-24 DIAGNOSIS — O09291 Supervision of pregnancy with other poor reproductive or obstetric history, first trimester: Secondary | ICD-10-CM

## 2016-11-24 DIAGNOSIS — Z113 Encounter for screening for infections with a predominantly sexual mode of transmission: Secondary | ICD-10-CM

## 2016-11-24 DIAGNOSIS — Z3481 Encounter for supervision of other normal pregnancy, first trimester: Secondary | ICD-10-CM

## 2016-11-24 DIAGNOSIS — Z1389 Encounter for screening for other disorder: Secondary | ICD-10-CM

## 2016-11-24 NOTE — Patient Instructions (Signed)
Pregnancy and Zika Virus Disease Zika virus disease, or Zika, is an illness that can spread to people from mosquitoes that carry the virus. It may also spread from person to person through infected body fluids. Zika first occurred in [place], but recently it has spread to new areas. The virus occurs in tropical climates. The location of Zika continues to change. Most people who become infected with Zika virus do not develop serious illness. However, Zika may cause birth defects in an unborn baby whose mother is infected with the virus. It may also increase the risk of miscarriage. What are the symptoms of Zika virus disease? In many cases, people who have been infected with Zika virus do not develop any symptoms. If symptoms appear, they usually start about a week after the person is infected. Symptoms are usually mild. They may include:  Fever.  Rash.  Red eyes.  Joint pain. How does Zika virus disease spread? The main way that Zika virus spreads is through the bite of a certain type of mosquito. Unlike most types of mosquitos, which bite only at night, the type of mosquito that carries Zika virus bites both at night and during the day. Zika virus can also spread through sexual contact, through a blood transfusion, and from a mother to her baby before or during birth. Once you have had Zika virus disease, it is unlikely that you will get it again. Can I pass Zika to my baby during pregnancy? Yes, Zika can pass from a mother to her baby before or during birth. What problems can Zika cause for my baby? A woman who is infected with Zika virus while pregnant is at risk of having her baby born with a condition in which the brain or head is smaller than expected (microcephaly). Babies who have microcephaly can have developmental delays, seizures, hearing problems, and vision problems. Having Zika virus disease during pregnancy can also increase the risk of miscarriage. How can Zika virus disease be  prevented? There is no vaccine to prevent Zika. The best way to prevent the disease is to avoid infected mosquitoes and avoid exposure to body fluids that can spread the virus. Avoid any possible exposure to Zika by taking the following precautions. For women and their sex partners:  Avoid traveling to high-risk areas. The locations where Zika is being reported change often. To identify high-risk areas, check the CDC travel website: www.cdc.gov/zika/geo/index.html  If you or your sex partner must travel to a high-risk area, talk with a health care provider before and after traveling.  Take all precautions to avoid mosquito bites if you live in, or travel to, any of the high-risk areas. Insect repellents are safe to use during pregnancy.  Ask your health care provider when it is safe to have sexual contact. For women:  If you are pregnant or trying to become pregnant, avoid sexual contact with persons who may have been exposed to Zika virus, persons who have possible symptoms of Zika, or persons whose history you are unsure about. If you choose to have sexual contact with someone who may have been exposed to Zika virus, use condoms correctly during the entire duration of sexual activity, every time. Do not share sexual devices, as you may be exposed to body fluids.  Ask your health care provider about when it is safe to attempt pregnancy after a possible exposure to Zika virus. What steps should I take to avoid mosquito bites? Take these steps to avoid mosquito bites when you are   in a high-risk area:  Wear loose clothing that covers your arms and legs.  Limit your outdoor activities.  Do not open windows unless they have window screens.  Sleep under mosquito nets.  Use insect repellent. The best insect repellents have:  DEET, picaridin, oil of lemon eucalyptus (OLE), or IR3535 in them.  Higher amounts of an active ingredient in them.  Remember that insect repellents are safe to use  during pregnancy.  Do not use OLE on children who are younger than 3 years of age. Do not use insect repellent on babies who are younger than 2 months of age.  Cover your child's stroller with mosquito netting. Make sure the netting fits snugly and that any loose netting does not cover your child's mouth or nose. Do not use a blanket as a mosquito-protection cover.  Do not apply insect repellent underneath clothing.  If you are using sunscreen, apply the sunscreen before applying the insect repellent.  Treat clothing with permethrin. Do not apply permethrin directly to your skin. Follow label directions for safe use.  Get rid of standing water, where mosquitoes may reproduce. Standing water is often found in items such as buckets, bowls, animal food dishes, and flowerpots. When you return from traveling to any high-risk area, continue taking actions to protect yourself against mosquito bites for 3 weeks, even if you show no signs of illness. This will prevent spreading Zika virus to uninfected mosquitoes. What should I know about the sexual transmission of Zika? People can spread Zika to their sexual partners during vaginal, anal, or oral sex, or by sharing sexual devices. Many people with Zika do not develop symptoms, so a person could spread the disease without knowing that they are infected. The greatest risk is to women who are pregnant or who may become pregnant. Zika virus can live longer in semen than it can live in blood. Couples can prevent sexual transmission of the virus by:  Using condoms correctly during the entire duration of sexual activity, every time. This includes vaginal, anal, and oral sex.  Not sharing sexual devices. Sharing increases your risk of being exposed to body fluid from another person.  Avoiding all sexual activity until your health care provider says it is safe. Should I be tested for Zika virus? A sample of your blood can be tested for Zika virus. A pregnant  woman should be tested if she may have been exposed to the virus or if she has symptoms of Zika. She may also have additional tests done during her pregnancy, such ultrasound testing. Talk with your health care provider about which tests are recommended. This information is not intended to replace advice given to you by your health care provider. Make sure you discuss any questions you have with your health care provider. Document Released: 05/09/2015 Document Revised: 01/24/2016 Document Reviewed: 05/02/2015 Elsevier Interactive Patient Education  2017 [employer] Minor Illnesses and Medications in Pregnancy  Cold/Flu:  Sudafed for congestion- Robitussin (plain) for cough- Tylenol for discomfort.  Please follow the directions on the label.  Try not to take any more than needed.  OTC Saline nasal spray and air humidifier or cool-mist  Vaporizer to sooth nasal irritation and to loosen congestion.  It is also important to increase intake of non carbonated fluids, especially if you have a fever.  Constipation:  Colace-2 capsules at bedtime; Metamucil- follow directions on label; Senokot- 1 tablet at bedtime.  Any one of these medications can be used.  It is also   very important to increase fluids and fruits along with regular exercise.  If problem persists please call the office.  Diarrhea:  Kaopectate as directed on the label.  Eat a bland diet and increase fluids.  Avoid highly seasoned foods.  Headache:  Tylenol 1 or 2 tablets every 3-4 hours as needed  Indigestion:  Maalox, Mylanta, Tums or Rolaids- as directed on label.  Also try to eat small meals and avoid fatty, greasy or spicy foods.  Nausea with or without Vomiting:  Nausea in pregnancy is caused by increased levels of hormones in the body which influence the digestive system and cause irritation when stomach acids accumulate.  Symptoms usually subside after 1st trimester of pregnancy.  Try the following: 1. Keep saltines, graham crackers  or dry toast by your bed to eat upon awakening. 2. Don't let your stomach get empty.  Try to eat 5-6 small meals per day instead of 3 large ones. 3. Avoid greasy fatty or highly seasoned foods.  4. Take OTC Unisom 1 tablet at bed time along with OTC Vitamin B6 25-50 mg 3 times per day.    If nausea continues with vomiting and you are unable to keep down food and fluids you may need a prescription medication.  Please notify your provider.   Sore throat:  Chloraseptic spray, throat lozenges and or plain Tylenol.  Vaginal Yeast Infection:  OTC Monistat for 7 days as directed on label.  If symptoms do not resolve within a week notify provider.  If any of the above problems do not subside with recommended treatment please call the office for further assistance.   Do not take Aspirin, Advil, Motrin or Ibuprofen.  * * OTC= Over the counter Hyperemesis Gravidarum Hyperemesis gravidarum is a severe form of nausea and vomiting that happens during pregnancy. Hyperemesis is worse than morning sickness. It may cause you to have nausea or vomiting all day for many days. It may keep you from eating and drinking enough food and liquids. Hyperemesis usually occurs during the first half (the first 20 weeks) of pregnancy. It often goes away once a woman is in her second half of pregnancy. However, sometimes hyperemesis continues through an entire pregnancy. What are the causes? The cause of this condition is not known. It may be related to changes in chemicals (hormones) in the body during pregnancy, such as the high level of pregnancy hormone (human chorionic gonadotropin) or the increase in the female sex hormone (estrogen). What are the signs or symptoms? Symptoms of this condition include:  Severe nausea and vomiting.  Nausea that does not go away.  Vomiting that does not allow you to keep any food down.  Weight loss.  Body fluid loss (dehydration).  Having no desire to eat, or not liking food that you  have previously enjoyed. How is this diagnosed? This condition may be diagnosed based on:  A physical exam.  Your medical history.  Your symptoms.  Blood tests.  Urine tests. How is this treated? This condition may be managed with medicine. If medicines to do not help relieve nausea and vomiting, you may need to receive fluids through an IV tube at the hospital. Follow these instructions at home:  Take over-the-counter and prescription medicines only as told by your health care provider.  Avoid iron pills and multivitamins that contain iron for the first 3-4 months of pregnancy. If you take prescription iron pills, do not stop taking them unless your health care provider approves.  Take the   following actions to help prevent nausea and vomiting:  In the morning, before getting out of bed, try eating a couple of dry crackers or a piece of toast.  Avoid foods and smells that upset your stomach. Fatty and spicy foods may make nausea worse.  Eat 5-6 small meals a day.  Do not drink fluids while eating meals. Drink between meals.  Eat or suck on things that have ginger in them. Ginger can help relieve nausea.  Avoid food preparation. The smell of food can spoil your appetite or trigger nausea.  Follow instructions from your health care provider about eating or drinking restrictions.  For snacks, eat high-protein foods, such as cheese.  Keep all follow-up and pre-birth (prenatal) visits as told by your health care provider. This is important. Contact a health care provider if:  You have pain in your abdomen.  You have a severe headache.  You have vision problems.  You are losing weight. Get help right away if:  You cannot drink fluids without vomiting.  You vomit blood.  You have constant nausea and vomiting.  You are very weak.  You are very thirsty.  You feel dizzy.  You faint.  You have a fever or other symptoms that last for more than 2-3 days.  You  have a fever and your symptoms suddenly get worse. Summary  Hyperemesis gravidarum is a severe form of nausea and vomiting that happens during pregnancy.  Making some changes to your eating habits may help relieve nausea and vomiting.  This condition may be managed with medicine.  If medicines to do not help relieve nausea and vomiting, you may need to receive fluids through an IV tube at the hospital. This information is not intended to replace advice given to you by your health care provider. Make sure you discuss any questions you have with your health care provider. Document Released: 08/18/2005 Document Revised: 04/16/2016 Document Reviewed: 04/16/2016 Elsevier Interactive Patient Education  2017 [employer] First Trimester of Pregnancy The first trimester of pregnancy is from week 1 until the end of week 13 (months 1 through 3). During this time, your baby will begin to develop inside you. At 6-8 weeks, the eyes and face are formed, and the heartbeat can be seen on ultrasound. At the end of 12 weeks, all the baby's organs are formed. Prenatal care is all the medical care you receive before the birth of your baby. Make sure you get good prenatal care and follow all of your doctor's instructions. Follow these instructions at home: Medicines   Take over-the-counter and prescription medicines only as told by your doctor. Some medicines are safe and some medicines are not safe during pregnancy.  Take a prenatal vitamin that contains at least 600 micrograms (mcg) of folic acid.  If you have trouble pooping (constipation), take medicine that will make your stool soft (stool softener) if your doctor approves. Eating and drinking   Eat regular, healthy meals.  Your doctor will tell you the amount of weight gain that is right for you.  Avoid raw meat and uncooked cheese.  If you feel sick to your stomach (nauseous) or throw up (vomit):  Eat 4 or 5 small meals a day instead of 3 large  meals.  Try eating a few soda crackers.  Drink liquids between meals instead of during meals.  To prevent constipation:  Eat foods that are high in fiber, like fresh fruits and vegetables, whole grains, and beans.  Drink enough fluids to   keep your pee (urine) clear or pale yellow. Activity   Exercise only as told by your doctor. Stop exercising if you have cramps or pain in your lower belly (abdomen) or low back.  Do not exercise if it is too hot, too humid, or if you are in a place of great height (high altitude).  Try to avoid standing for long periods of time. Move your legs often if you must stand in one place for a long time.  Avoid heavy lifting.  Wear low-heeled shoes. Sit and stand up straight.  You can have sex unless your doctor tells you not to. Relieving pain and discomfort   Wear a good support bra if your breasts are sore.  Take warm water baths (sitz baths) to soothe pain or discomfort caused by hemorrhoids. Use hemorrhoid cream if your doctor says it is okay.  Rest with your legs raised if you have leg cramps or low back pain.  If you have puffy, bulging veins (varicose veins) in your legs:  Wear support hose or compression stockings as told by your doctor.  Raise (elevate) your feet for 15 minutes, 3-4 times a day.  Limit salt in your food. Prenatal care   Schedule your prenatal visits by the twelfth week of pregnancy.  Write down your questions. Take them to your prenatal visits.  Keep all your prenatal visits as told by your doctor. This is important. Safety   Wear your seat belt at all times when driving.  Make a list of emergency phone numbers. The list should include numbers for family, friends, the hospital, and police and fire departments. General instructions   Ask your doctor for a referral to a local prenatal class. Begin classes no later than at the start of month 6 of your pregnancy.  Ask for help if you need counseling or if you  need help with nutrition. Your doctor can give you advice or tell you where to go for help.  Do not use hot tubs, steam rooms, or saunas.  Do not douche or use tampons or scented sanitary pads.  Do not cross your legs for long periods of time.  Avoid all herbs and alcohol. Avoid drugs that are not approved by your doctor.  Do not use any tobacco products, including cigarettes, chewing tobacco, and electronic cigarettes. If you need help quitting, ask your doctor. You may get counseling or other support to help you quit.  Avoid cat litter boxes and soil used by cats. These carry germs that can cause birth defects in the baby and can cause a loss of your baby (miscarriage) or stillbirth.  Visit your dentist. At home, brush your teeth with a soft toothbrush. Be gentle when you floss. Contact a doctor if:  You are dizzy.  You have mild cramps or pressure in your lower belly.  You have a nagging pain in your belly area.  You continue to feel sick to your stomach, you throw up, or you have watery poop (diarrhea).  You have a bad smelling fluid coming from your vagina.  You have pain when you pee (urinate).  You have increased puffiness (swelling) in your face, hands, legs, or ankles. Get help right away if:  You have a fever.  You are leaking fluid from your vagina.  You have spotting or bleeding from your vagina.  You have very bad belly cramping or pain.  You gain or lose weight rapidly.  You throw up blood. It may look like coffee   grounds.  You are around people who have German measles, fifth disease, or chickenpox.  You have a very bad headache.  You have shortness of breath.  You have any kind of trauma, such as from a fall or a car accident. Summary  The first trimester of pregnancy is from week 1 until the end of week 13 (months 1 through 3).  To take care of yourself and your unborn baby, you will need to eat healthy meals, take medicines only if your doctor  tells you to do so, and do activities that are safe for you and your baby.  Keep all follow-up visits as told by your doctor. This is important as your doctor will have to ensure that your baby is healthy and growing well. This information is not intended to replace advice given to you by your health care provider. Make sure you discuss any questions you have with your health care provider. Document Released: 02/04/2008 Document Revised: 08/26/2016 Document Reviewed: 08/26/2016 Elsevier Interactive Patient Education  2017 [employer] Commonly Asked Questions During Pregnancy  Cats: A parasite can be excreted in cat feces.  To avoid exposure you need to have another person empty the little box.  If you must empty the litter box you will need to wear gloves.  Wash your hands after handling your cat.  This parasite can also be found in raw or undercooked meat so this should also be avoided.  Colds, Sore Throats, Flu: Please check your medication sheet to see what you can take for symptoms.  If your symptoms are unrelieved by these medications please call the office.  Dental Work: Most any dental work your dentist recommends is permitted.  X-rays should only be taken during the first trimester if absolutely necessary.  Your abdomen should be shielded with a lead apron during all x-rays.  Please notify your provider prior to receiving any x-rays.  Novocaine is fine; gas is not recommended.  If your dentist requires a note from us prior to dental work please call the office and we will provide one for you.  Exercise: Exercise is an important part of staying healthy during your pregnancy.  You may continue most exercises you were accustomed to prior to pregnancy.  Later in your pregnancy you will most likely notice you have difficulty with activities requiring balance like riding a bicycle.  It is important that you listen to your body and avoid activities that put you at a higher risk of falling.   Adequate rest and staying well hydrated are a must!  If you have questions about the safety of specific activities ask your provider.    Exposure to Children with illness: Try to avoid obvious exposure; report any symptoms to us when noted,  If you have chicken pos, red measles or mumps, you should be immune to these diseases.   Please do not take any vaccines while pregnant unless you have checked with your OB provider.  Fetal Movement: After 28 weeks we recommend you do "kick counts" twice daily.  Lie or sit down in a calm quiet environment and count your baby movements "kicks".  You should feel your baby at least 10 times per hour.  If you have not felt 10 kicks within the first hour get up, walk around and have something sweet to eat or drink then repeat for an additional hour.  If count remains less than 10 per hour notify your provider.  Fumigating: Follow your pest control agent's advice as   to how long to stay out of your home.  Ventilate the area well before re-entering.  Hemorrhoids:   Most over-the-counter preparations can be used during pregnancy.  Check your medication to see what is safe to use.  It is important to use a stool softener or fiber in your diet and to drink lots of liquids.  If hemorrhoids seem to be getting worse please call the office.   Hot Tubs:  Hot tubs Jacuzzis and saunas are not recommended while pregnant.  These increase your internal body temperature and should be avoided.  Intercourse:  Sexual intercourse is safe during pregnancy as long as you are comfortable, unless otherwise advised by your provider.  Spotting may occur after intercourse; report any bright red bleeding that is heavier than spotting.  Labor:  If you know that you are in labor, please go to the hospital.  If you are unsure, please call the office and let us help you decide what to do.  Lifting, straining, etc:  If your job requires heavy lifting or straining please check with your provider for  any limitations.  Generally, you should not lift items heavier than that you can lift simply with your hands and arms (no back muscles)  Painting:  Paint fumes do not harm your pregnancy, but may make you ill and should be avoided if possible.  Latex or water based paints have less odor than oils.  Use adequate ventilation while painting.  Permanents & Hair Color:  Chemicals in hair dyes are not recommended as they cause increase hair dryness which can increase hair loss during pregnancy.  " Highlighting" and permanents are allowed.  Dye may be absorbed differently and permanents may not hold as well during pregnancy.  Sunbathing:  Use a sunscreen, as skin burns easily during pregnancy.  Drink plenty of fluids; avoid over heating.  Tanning Beds:  Because their possible side effects are still unknown, tanning beds are not recommended.  Ultrasound Scans:  Routine ultrasounds are performed at approximately 20 weeks.  You will be able to see your baby's general anatomy an if you would like to know the gender this can usually be determined as well.  If it is questionable when you conceived you may also receive an ultrasound early in your pregnancy for dating purposes.  Otherwise ultrasound exams are not routinely performed unless there is a medical necessity.  Although you can request a scan we ask that you pay for it when conducted because insurance does not cover " patient request" scans.  Work: If your pregnancy proceeds without complications you may work until your due date, unless your physician or employer advises otherwise.  Round Ligament Pain/Pelvic Discomfort:  Sharp, shooting pains not associated with bleeding are fairly common, usually occurring in the second trimester of pregnancy.  They tend to be worse when standing up or when you remain standing for long periods of time.  These are the result of pressure of certain pelvic ligaments called "round ligaments".  Rest, Tylenol and heat seem to be  the most effective relief.  As the womb and fetus grow, they rise out of the pelvis and the discomfort improves.  Please notify the office if your pain seems different than that described.  It may represent a more serious condition.   

## 2016-11-24 NOTE — Progress Notes (Signed)
Pamela [name] presents for NOB nurse interview visit. Pregnancy confirmation done at [facility] on 10/08/2016. UPT: positive. G-4.  P-1021. H/O TAB and Spontaneous Miscarriage at 14 wks in 2016, needing a D&C. Has h/o C/S.  Pregnancy education material explained and given. No cats in the home. NOB labs ordered.  HIV labs and Drug screen were explained optional and she did not decline. Drug screen ordered. PNV encouraged. Genetic screening options discussed. Genetic testing: to be ordred when pt comes in for it ([facility]).  Pt may discuss with provider if any further questions.  Ultrasound for hx of multiple miscarriages, one at 14 wks. done to day. EGA: 7.1 wk., +3 days.  Which is consistent with LMP. Pt. To follow up with provider in 5 weeks for NOB physical.  All questions answered.

## 2016-11-26 LAB — URINALYSIS, ROUTINE W REFLEX MICROSCOPIC
BILIRUBIN UA: NEGATIVE
GLUCOSE, UA: NEGATIVE
KETONES UA: NEGATIVE
NITRITE UA: NEGATIVE
PROTEIN UA: NEGATIVE
RBC UA: NEGATIVE
Specific Gravity, UA: 1.009 (ref 1.005–1.030)
Urobilinogen, Ur: 0.2 mg/dL (ref 0.2–1.0)
pH, UA: 7 (ref 5.0–7.5)

## 2016-11-26 LAB — MONITOR DRUG PROFILE 14(MW)
Amphetamine Scrn, Ur: NEGATIVE ng/mL
BARBITURATE SCREEN URINE: NEGATIVE ng/mL
BENZODIAZEPINE SCREEN, URINE: NEGATIVE ng/mL
BUPRENORPHINE, URINE: NEGATIVE ng/mL
CANNABINOIDS UR QL SCN: NEGATIVE ng/mL
COCAINE(METAB.)SCREEN, URINE: NEGATIVE ng/mL
CREATININE(CRT), U: 55.8 mg/dL (ref 20.0–300.0)
FENTANYL, URINE: NEGATIVE pg/mL
METHADONE SCREEN, URINE: NEGATIVE ng/mL
Meperidine Screen, Urine: NEGATIVE ng/mL
OXYCODONE+OXYMORPHONE UR QL SCN: NEGATIVE ng/mL
Opiate Scrn, Ur: NEGATIVE ng/mL
PH UR, DRUG SCRN: 6.5 (ref 4.5–8.9)
PHENCYCLIDINE QUANTITATIVE URINE: NEGATIVE ng/mL
Propoxyphene Scrn, Ur: NEGATIVE ng/mL
SPECIFIC GRAVITY: 1.007
Tramadol Screen, Urine: NEGATIVE ng/mL

## 2016-11-26 LAB — CBC WITH DIFFERENTIAL/PLATELET
BASOS ABS: 0 10*3/uL (ref 0.0–0.2)
Basos: 0 %
EOS (ABSOLUTE): 0.1 10*3/uL (ref 0.0–0.4)
Eos: 1 %
Hematocrit: 39.3 % (ref 34.0–46.6)
Hemoglobin: 12.6 g/dL (ref 11.1–15.9)
Immature Grans (Abs): 0 10*3/uL (ref 0.0–0.1)
Immature Granulocytes: 0 %
LYMPHS ABS: 2.5 10*3/uL (ref 0.7–3.1)
Lymphs: 25 %
MCH: 29.2 pg (ref 26.6–33.0)
MCHC: 32.1 g/dL (ref 31.5–35.7)
MCV: 91 fL (ref 79–97)
Monocytes Absolute: 0.7 10*3/uL (ref 0.1–0.9)
Monocytes: 7 %
NEUTROS ABS: 6.7 10*3/uL (ref 1.4–7.0)
Neutrophils: 67 %
PLATELETS: 255 10*3/uL (ref 150–379)
RBC: 4.32 x10E6/uL (ref 3.77–5.28)
RDW: 13 % (ref 12.3–15.4)
WBC: 10 10*3/uL (ref 3.4–10.8)

## 2016-11-26 LAB — HEPATITIS B SURFACE ANTIGEN: HEP B S AG: NEGATIVE

## 2016-11-26 LAB — RPR: RPR Ser Ql: NONREACTIVE

## 2016-11-26 LAB — RUBELLA SCREEN: Rubella Antibodies, IGG: 6.19 index (ref >0.99)

## 2016-11-26 LAB — MICROSCOPIC EXAMINATION
BACTERIA UA: NONE SEEN
Casts: NONE SEEN /lpf

## 2016-11-26 LAB — NICOTINE SCREEN, URINE: Cotinine Ql Scrn, Ur: NEGATIVE ng/mL

## 2016-11-26 LAB — HIV ANTIBODY (ROUTINE TESTING W REFLEX): HIV Screen 4th Generation wRfx: NONREACTIVE

## 2016-11-26 LAB — VARICELLA ZOSTER ANTIBODY, IGG: Varicella zoster IgG: 833 index (ref >165)

## 2016-11-26 LAB — ANTIBODY SCREEN: ANTIBODY SCREEN: NEGATIVE

## 2016-11-26 LAB — ABO

## 2016-11-26 LAB — SICKLE CELL SCREEN: Sickle Cell Screen: NEGATIVE

## 2016-11-26 LAB — RH TYPE: RH TYPE: POSITIVE

## 2016-12-01 LAB — CULTURE, OB URINE

## 2016-12-01 LAB — URINE CULTURE, OB REFLEX

## 2016-12-01 IMAGING — US US TRANSVAGINAL NON-OB
1 series · 14 of 25 positions shown · non-contrast
Comparison: None

CLINICAL DATA: Dysfunctional uterine bleeding

EXAM:
TRANSABDOMINAL AND TRANSVAGINAL ULTRASOUND OF PELVIS
TECHNIQUE: Both transabdominal and transvaginal ultrasound examinations of the
pelvis were performed. Transabdominal technique was performed for
global imaging of the pelvis including uterus, ovaries, adnexal
regions, and pelvic cul-de-sac. It was necessary to proceed with
endovaginal exam following the transabdominal exam to visualize the
right ovary.

[Series 1: us transvaginal non-ob · 0.20mm/px · 14 of 100 slices shown]
[im 1/100]
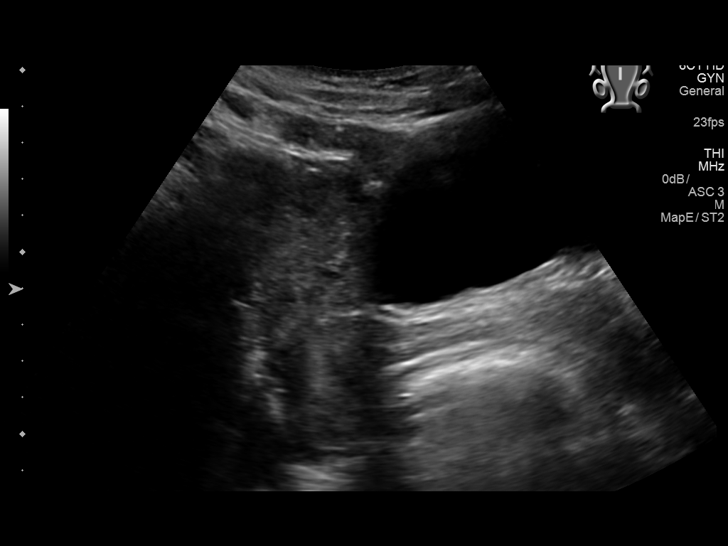
[im 9/100]
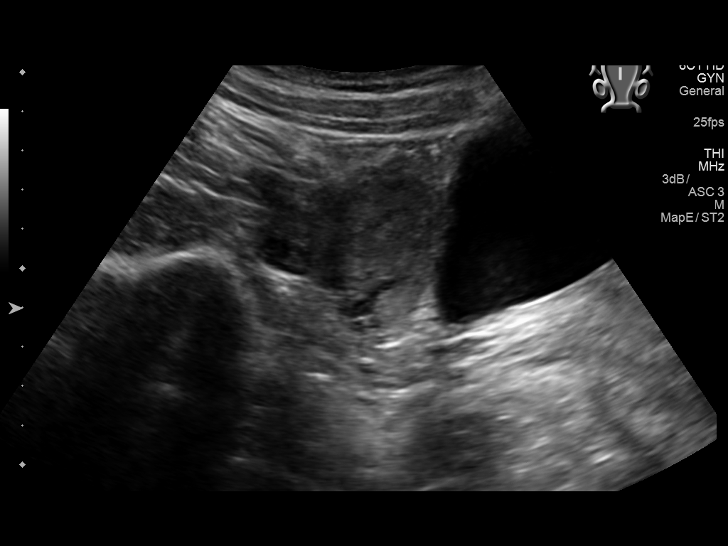
[im 17/100]
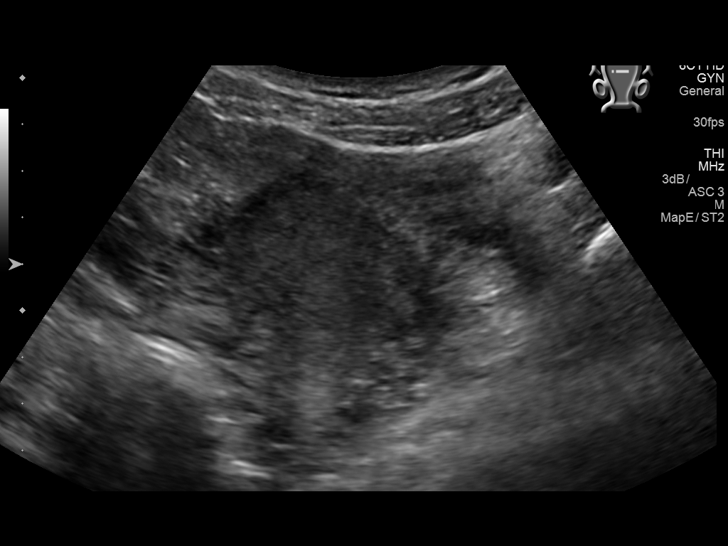
[im 25/100]
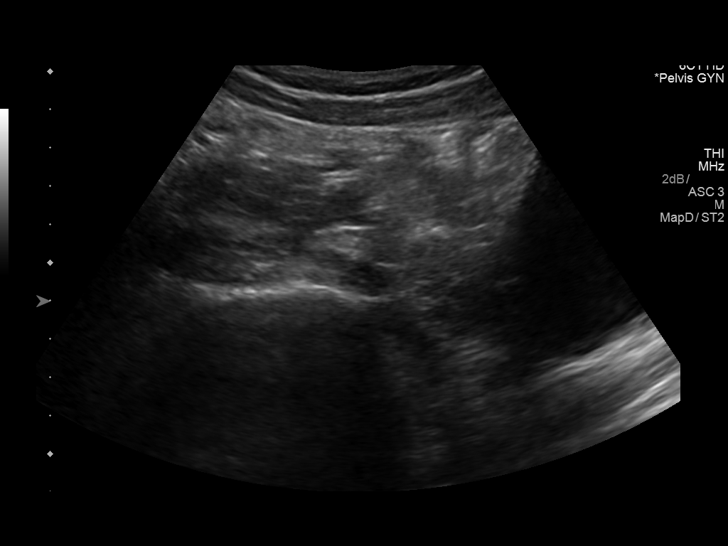
[im 34/100]
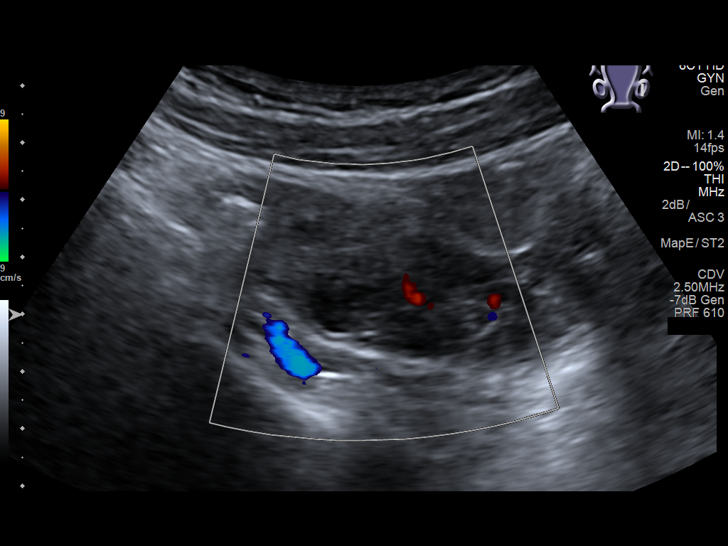
[im 38/100]
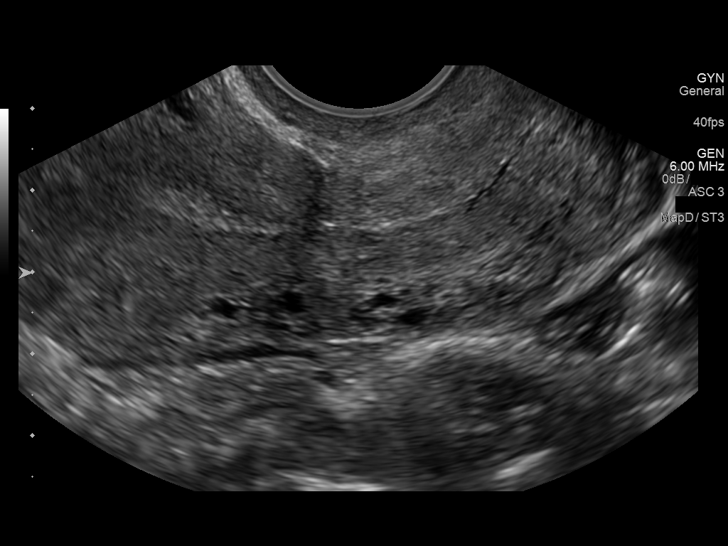
[im 46/100]
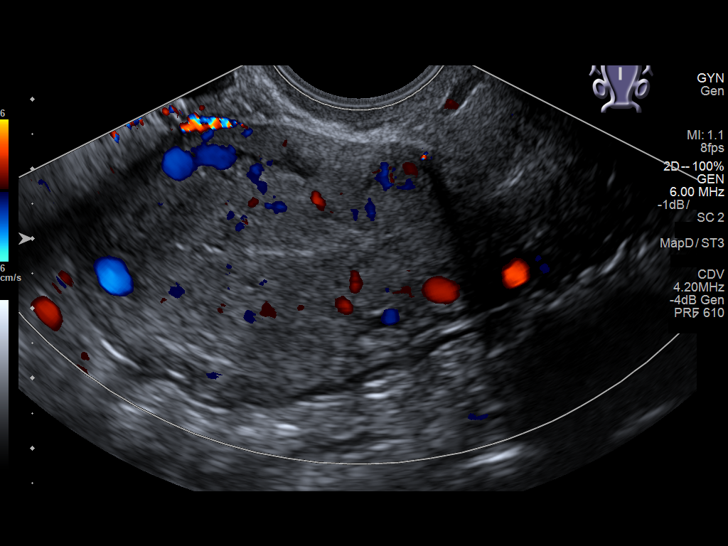
[im 54/100]
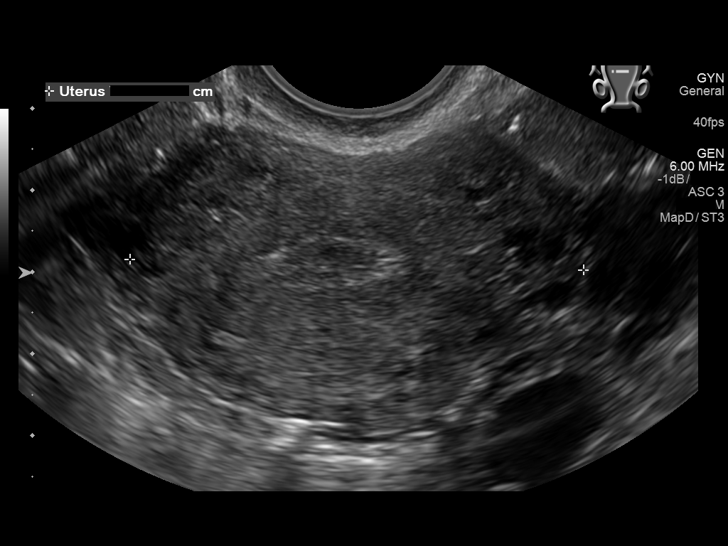
[im 62/100]
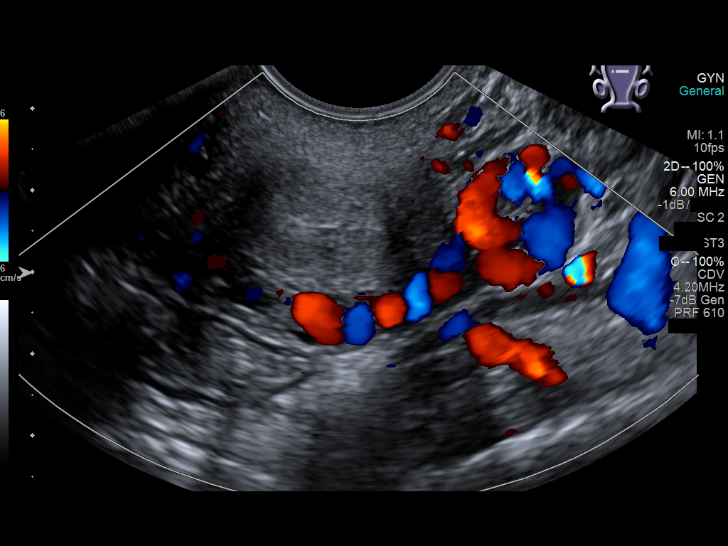
[im 67/100]
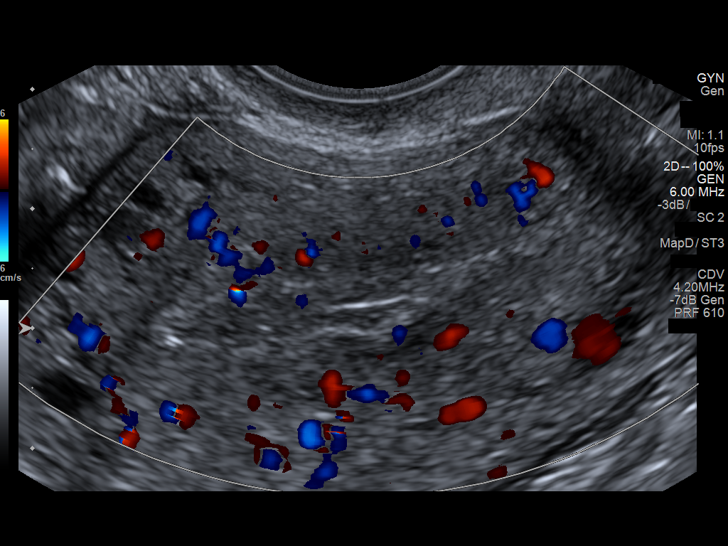
[im 75/100]
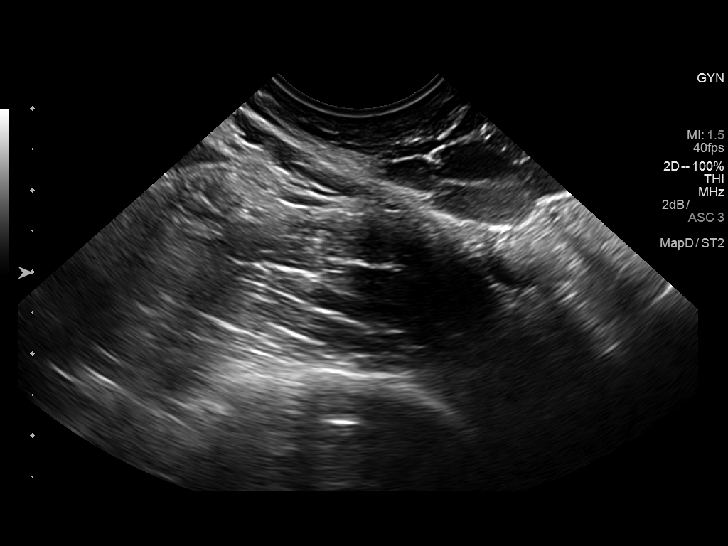
[im 83/100]
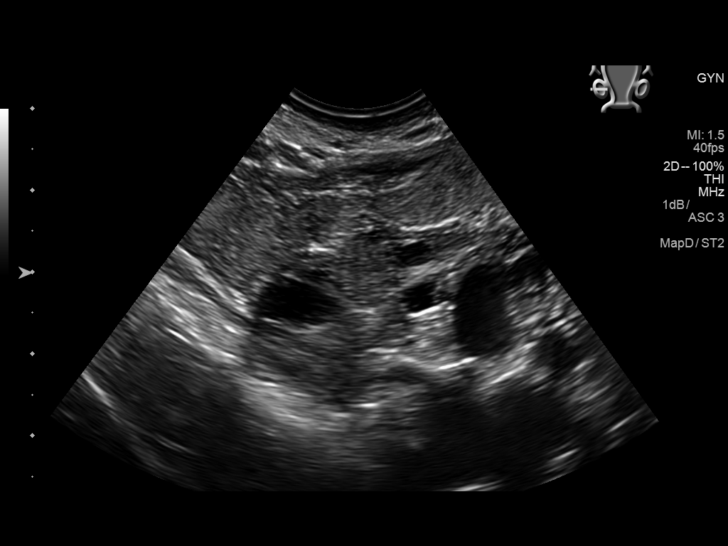
[im 91/100]
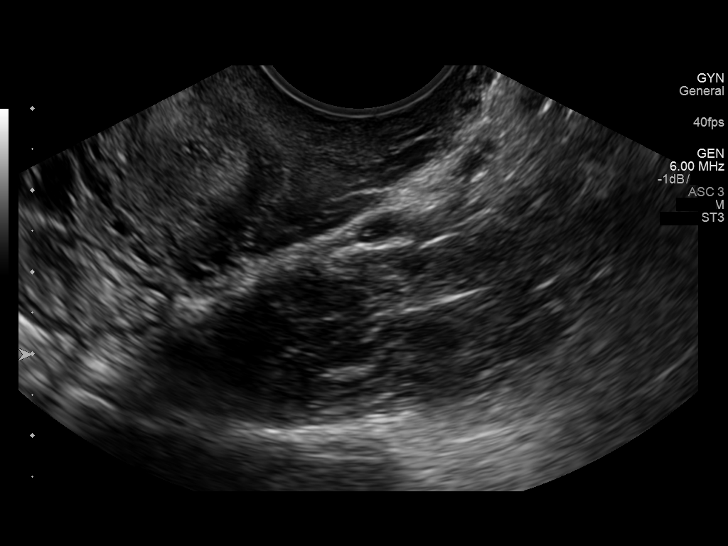
[im 100/100]
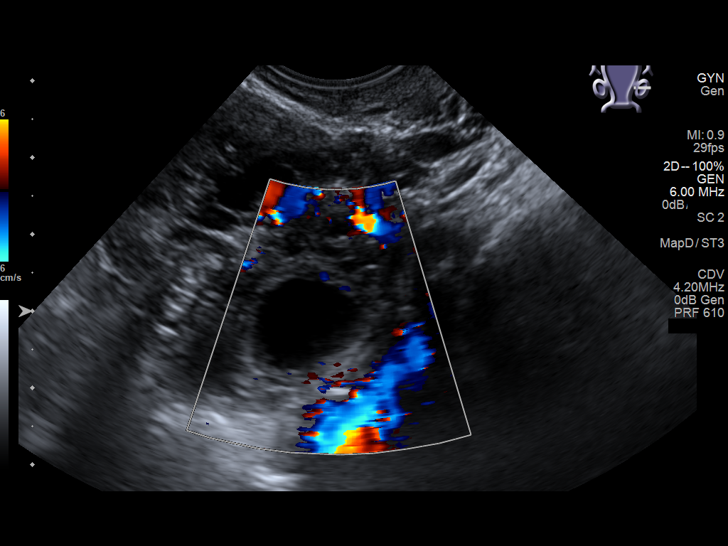

[14 of 25 positions shown; findings below may reference images not displayed]

FINDINGS: Uterus

Measurements: 8.5 x 4.1 x 5.5 cm. No fibroids or other mass
visualized.

Endometrium

Thickness: 6 mm.  No focal abnormality visualized.

Right ovary

Measurements: 2.4 x 1.4 x 2.1 cm. Normal appearance/no adnexal mass.

Left ovary

Measurements: 1.8 x 1.7 x 1.7 cm. Normal appearance/no adnexal mass.

Other findings

Trace pelvic ascites, physiologic.
IMPRESSION: Negative pelvic ultrasound.

## 2016-12-02 ENCOUNTER — Encounter: Payer: Self-pay | Admitting: Certified Nurse Midwife

## 2016-12-02 ENCOUNTER — Other Ambulatory Visit: Payer: Self-pay | Admitting: Certified Nurse Midwife

## 2016-12-02 MED ORDER — NITROFURANTOIN MONOHYD MACRO 100 MG PO CAPS: 100.0000 mg | ORAL_CAPSULE | Freq: Two times a day (BID) | ORAL | 0 refills | Status: AC

## 2016-12-02 NOTE — Progress Notes (Signed)
Urine culture came back positive for UTI, macrobid ordered. Pt notified via my chart.  Doreene Burke, CNM

## 2016-12-19 ENCOUNTER — Ambulatory Visit (INDEPENDENT_AMBULATORY_CARE_PROVIDER_SITE_OTHER): Payer: Self-pay | Admitting: Certified Nurse Midwife

## 2016-12-19 ENCOUNTER — Telehealth: Payer: Self-pay | Admitting: Certified Nurse Midwife

## 2016-12-19 VITALS — BP 110/60 | HR 70 | Wt 140.0 lb

## 2016-12-19 DIAGNOSIS — R300 Dysuria: Secondary | ICD-10-CM

## 2016-12-19 DIAGNOSIS — O2341 Unspecified infection of urinary tract in pregnancy, first trimester: Secondary | ICD-10-CM

## 2016-12-19 DIAGNOSIS — B373 Candidiasis of vulva and vagina: Secondary | ICD-10-CM

## 2016-12-19 DIAGNOSIS — B3731 Acute candidiasis of vulva and vagina: Secondary | ICD-10-CM

## 2016-12-19 LAB — POCT URINALYSIS DIPSTICK
Bilirubin, UA: NEGATIVE
Glucose, UA: NEGATIVE
KETONES UA: NEGATIVE
Leukocytes, UA: NEGATIVE
NITRITE UA: POSITIVE
PH UA: 6 (ref 5.0–8.0)
Spec Grav, UA: >=1.03 — AB (ref 1.010–1.025)
Urobilinogen, UA: 0.2 E.U./dL

## 2016-12-19 MED ORDER — TERCONAZOLE 0.4 % VA CREA: 1.0000 | TOPICAL_CREAM | Freq: Every day | VAGINAL | 0 refills | Status: DC

## 2016-12-19 MED ORDER — CEPHALEXIN 500 MG PO CAPS: 500.0000 mg | ORAL_CAPSULE | Freq: Two times a day (BID) | ORAL | 0 refills | Status: DC

## 2016-12-19 NOTE — Progress Notes (Signed)
Pamela [name] is a 31 y.o. female who complains of urinary frequency, urgency and dysuria x 7 days, without flank pain, fever, chills, or abnormal vaginal discharge or bleeding. Pt notes that she finished Macrobid a week ago and has yet to have any relief.   Urine dipstick shows: Nitrite POSITIVE.  PLAN: Treatment per orders RX for Keflex  BID - also push fluids, may use Pyridium OTC prn. Call or return to clinic prn if these symptoms worsen or fail to improve as anticipated.

## 2016-12-19 NOTE — Telephone Encounter (Signed)
Called pt, no answer. Unable to leave message as no voicemail is full.  

## 2016-12-19 NOTE — Telephone Encounter (Signed)
Patient called stating she is still having UTI symptoms after finishing up herd meds. Please Advise.

## 2016-12-19 NOTE — Patient Instructions (Signed)

## 2016-12-19 NOTE — Telephone Encounter (Signed)
Pt calls back notes that she has had no relief of UTI sx since finishing antibiotics a week ago, advised pt to come in and be seen. Pt voices understanding, will add to schedule.

## 2016-12-23 LAB — URINE CULTURE

## 2016-12-24 ENCOUNTER — Encounter: Payer: Self-pay | Admitting: Certified Nurse Midwife

## 2016-12-24 ENCOUNTER — Other Ambulatory Visit: Payer: Self-pay | Admitting: Certified Nurse Midwife

## 2016-12-24 MED ORDER — NITROFURANTOIN MONOHYD MACRO 100 MG PO CAPS: 100.0000 mg | ORAL_CAPSULE | Freq: Two times a day (BID) | ORAL | 0 refills | Status: DC

## 2016-12-24 NOTE — Progress Notes (Signed)
NOB urine culture positive for UTI. Prescription to pharmacy. Note via my chart to notify patient.  Pamela [name],CNM

## 2016-12-29 ENCOUNTER — Ambulatory Visit (INDEPENDENT_AMBULATORY_CARE_PROVIDER_SITE_OTHER): Payer: Medicaid Other | Admitting: Certified Nurse Midwife

## 2016-12-29 ENCOUNTER — Encounter: Payer: Self-pay | Admitting: Certified Nurse Midwife

## 2016-12-29 VITALS — BP 131/71 | HR 99 | Wt 139.8 lb

## 2016-12-29 DIAGNOSIS — Z3481 Encounter for supervision of other normal pregnancy, first trimester: Secondary | ICD-10-CM

## 2016-12-29 LAB — POCT URINALYSIS DIPSTICK
Bilirubin, UA: NEGATIVE
Blood, UA: NEGATIVE
KETONES UA: NEGATIVE
Leukocytes, UA: NEGATIVE
Nitrite, UA: NEGATIVE
PH UA: 6 (ref 5.0–8.0)
PROTEIN UA: NEGATIVE
SPEC GRAV UA: 1.02 (ref 1.010–1.025)
UROBILINOGEN UA: NEGATIVE U/dL — AB

## 2016-12-29 NOTE — Progress Notes (Signed)
NOB PE- Still taking keflex for uti- no sx today. Maternit done today. Last pap 06/2015 neg.

## 2016-12-29 NOTE — Patient Instructions (Signed)

## 2016-12-29 NOTE — Progress Notes (Signed)
NEW OB HISTORY AND PHYSICAL  SUBJECTIVE:       Pamela [name] is a 31 y.o. G30P1021 female, Patient's last menstrual period was 10/08/2016 (exact date)., Estimated Date of Delivery: 07/15/17, [redacted]w[redacted]d, presents today for establishment of Prenatal Care. She has no unusual complaints.    Gynecologic History Patient's last menstrual period was 10/08/2016 (exact date). Normal Contraception: none Last Pap:06/2015. Results were: normal  Obstetric History OB History  Gravida Para Term Preterm AB Living  SAB TAB Ectopic Multiple Live Births  # Outcome Date GA Lbr Len/2nd Weight Sex Delivery Anes PTL Lv  4 Current           3 SAB 08/07/15 [redacted]w[redacted]d       ND  2 Term 12/01/08 [redacted]w[redacted]d  8 lb 11 oz (3.941 kg) F CS-LTranv EPI  LIV  1 TAB 2008        ND    Obstetric Comments  2016 [employer])    Past Medical History:  Diagnosis Date  . Headache   . History of recurrent UTIs   . Positive urine pregnancy test    at [facility] 10/08/2016  . Umbilical hernia     Past Surgical History:  Procedure Laterality Date  . CESAREAN SECTION    . DILATION AND EVACUATION N/A 08/07/2015   Procedure: DILATATION AND EVACUATION;  Surgeon: Allie Bossier, MD;  Location: [facility];  Service: Gynecology;  Laterality: N/A;    Current Outpatient Prescriptions on File Prior to Visit  Medication Sig Dispense Refill  . cephALEXin (KEFLEX) 500 MG capsule Take 1 capsule (500 mg total) by mouth 2 (two) times daily. 14 capsule 0  . nitrofurantoin, macrocrystal-monohydrate, (MACROBID) 100 MG capsule Take 1 capsule (100 mg total) by mouth 2 (two) times daily. 14 capsule 0  . Prenatal Multivit-Min-Fe-FA (PRENATAL VITAMINS) 0.8 MG tablet Take 1 tablet by mouth daily.    Marland Kitchen terconazole (TERAZOL 7) 0.4 % vaginal cream Place 1 applicator vaginally at bedtime. 45 g 0   No current facility-administered medications on file prior to visit.     Allergies  Allergen Reactions  . Tramadol Itching    Social History    Social History  . Marital status: Single    Spouse name: N/A  . Number of children: N/A  . Years of education: N/A   Occupational History  . Not on file.   Social History Main Topics  . Smoking status: Never Smoker  . Smokeless tobacco: Never Used  . Alcohol use Yes     Comment: None since pregnancy  . Drug use: No  . Sexual activity: Yes    Birth control/ protection: None   Other Topics Concern  . Not on file   Social History Narrative  . No narrative on file    Family History  Problem Relation Age of Onset  . Alcohol abuse Neg Hx   . Arthritis Neg Hx   . Asthma Neg Hx   . Birth defects Neg Hx   . Cancer Neg Hx   . COPD Neg Hx   . Depression Neg Hx   . Diabetes Neg Hx   . Drug abuse Neg Hx   . Early death Neg Hx   . Hearing loss Neg Hx   . Heart disease Neg Hx   . Hyperlipidemia Neg Hx   . Hypertension Neg Hx   . Kidney disease Neg Hx   .  Learning disabilities Neg Hx   . Mental illness Neg Hx   . Mental retardation Neg Hx   . Miscarriages / Stillbirths Neg Hx   . Stroke Neg Hx   . Vision loss Neg Hx   . Varicose Veins Neg Hx     The following portions of the patient's history were reviewed and updated as appropriate: allergies, current medications, past OB history, past medical history, past surgical history, past family history, past social history, and problem list.    OBJECTIVE: Initial Physical Exam (New OB)  GENERAL APPEARANCE: alert, well appearing, in no apparent distress, oriented to person, place and time HEAD: normocephalic, atraumatic MOUTH: mucous membranes moist, pharynx normal without lesions THYROID: no thyromegaly or masses present BREASTS: small pea size masses noted, no significant tenderness, no palpable axillary nodes, no skin changes, small pea size firm nodule felt on pt right breast @ 11 o'clock it is mobile, non tender.  LUNGS: clear to auscultation, no wheezes, rales or rhonchi, symmetric air entry HEART: regular rate and  rhythm, no murmurs ABDOMEN: soft, nontender, nondistended, small pouching at umbilicus, no masses,  no epigastric pain EXTREMITIES: no redness or tenderness in the calves or thighs SKIN: normal coloration and turgor, no rashes LYMPH NODES: no adenopathy palpable NEUROLOGIC: alert, oriented, normal speech, no focal findings or movement disorder noted  PELVIC EXAM EXTERNAL GENITALIA: normal appearing vulva with no masses, tenderness or lesions VAGINA: no abnormal discharge or lesions CERVIX: no lesions or cervical motion tenderness UTERUS: gravid ADNEXA: no masses palpable and nontender OB EXAM PELVIMETRY: appears adequate RECTUM: exam not indicated  ASSESSMENT: Normal pregnancy  PLAN: Continue to monitor right breast for changes. Encouraged decrease in caffeine and salt intact, po hydration.  New OB counseling:The patient has been given an overview regarding routine prenatal care. Recommendations regarding diet, weight gain, and exercise in pregnancy were given. Prenatal testing, optional genetic testing, and ultrasound use in pregnancy were reviewed.  Benefits of Breast Feeding were discussed. The patient is encouraged to consider nursing her baby post partum. ROB in 4 wks.   Doreene Burke, CNM

## 2016-12-31 LAB — PAP IG W/ RFLX HPV ASCU: PAP Smear Comment: 0

## 2017-01-02 ENCOUNTER — Encounter: Payer: Self-pay | Admitting: Certified Nurse Midwife

## 2017-01-04 LAB — MATERNIT 21 PLUS CORE, BLOOD
CHROMOSOME 13: NEGATIVE
CHROMOSOME 18: NEGATIVE
CHROMOSOME 21: NEGATIVE
Y CHROMOSOME: NOT DETECTED

## 2017-01-05 ENCOUNTER — Encounter: Payer: Self-pay | Admitting: Certified Nurse Midwife

## 2017-01-16 ENCOUNTER — Encounter: Payer: Self-pay | Admitting: Certified Nurse Midwife

## 2017-01-28 ENCOUNTER — Ambulatory Visit (INDEPENDENT_AMBULATORY_CARE_PROVIDER_SITE_OTHER): Payer: Medicaid Other | Admitting: Obstetrics and Gynecology

## 2017-01-28 ENCOUNTER — Encounter: Payer: Self-pay | Admitting: Obstetrics and Gynecology

## 2017-01-28 VITALS — BP 113/64 | HR 68 | Wt 141.4 lb

## 2017-01-28 DIAGNOSIS — Z98891 History of uterine scar from previous surgery: Secondary | ICD-10-CM | POA: Insufficient documentation

## 2017-01-28 DIAGNOSIS — K429 Umbilical hernia without obstruction or gangrene: Secondary | ICD-10-CM

## 2017-01-28 DIAGNOSIS — O34219 Maternal care for unspecified type scar from previous cesarean delivery: Secondary | ICD-10-CM

## 2017-01-28 DIAGNOSIS — Z3492 Encounter for supervision of normal pregnancy, unspecified, second trimester: Secondary | ICD-10-CM

## 2017-01-28 LAB — POCT URINALYSIS DIPSTICK
Bilirubin, UA: NEGATIVE
Blood, UA: NEGATIVE
Glucose, UA: NEGATIVE
Ketones, UA: NEGATIVE
Leukocytes, UA: NEGATIVE
Nitrite, UA: NEGATIVE
Protein, UA: NEGATIVE
Spec Grav, UA: 1.015
Urobilinogen, UA: 0.2 U/dL
pH, UA: 6

## 2017-01-28 NOTE — Progress Notes (Signed)
ROB-PMH form completed; urine sent for culture; anatomy scan next visit,discussed TOLAC and risk associated, also discussed belly binder and use in pregnancy, hernia repair after pregancy.

## 2017-01-28 NOTE — Progress Notes (Signed)
ROB- pt is doing well 

## 2017-01-28 NOTE — Addendum Note (Signed)
Addended by: Rosine BeatLONTZ, AMY L on: 01/28/2017 04:44 PM   Modules accepted: Orders

## 2017-02-02 LAB — URINE CULTURE

## 2017-02-26 ENCOUNTER — Encounter: Payer: Self-pay | Admitting: Certified Nurse Midwife

## 2017-02-26 ENCOUNTER — Ambulatory Visit (INDEPENDENT_AMBULATORY_CARE_PROVIDER_SITE_OTHER): Payer: Medicaid Other | Admitting: Certified Nurse Midwife

## 2017-02-26 VITALS — BP 100/55 | HR 76 | Wt 145.1 lb

## 2017-02-26 DIAGNOSIS — N390 Urinary tract infection, site not specified: Secondary | ICD-10-CM

## 2017-02-26 DIAGNOSIS — Z3483 Encounter for supervision of other normal pregnancy, third trimester: Secondary | ICD-10-CM

## 2017-02-26 LAB — POCT URINALYSIS DIPSTICK
Bilirubin, UA: NEGATIVE
Blood, UA: NEGATIVE
GLUCOSE UA: NEGATIVE
KETONES UA: NEGATIVE
Leukocytes, UA: NEGATIVE
Nitrite, UA: NEGATIVE
Protein, UA: NEGATIVE
Urobilinogen, UA: 0.2 E.U./dL
pH, UA: 5 (ref 5.0–8.0)

## 2017-02-26 NOTE — Progress Notes (Signed)
Pt states she is here for an U/S and routine OB visit.

## 2017-02-26 NOTE — Progress Notes (Signed)
ROB-Pt doing well. Anatomy scan not scheduled due to front desk error. Pt scheduled for anatomy scan Friday, 02/27/2017 @ 1015. Urine culture sent today for history of recurrent UTI. Reviewed red flag symptoms and when to call. RTC x 4 weeks for ROB or sooner if needed. Jola BabinskiMarilyn, Medicaid Pregnancy Case Manager, in room to see patient after appointment.

## 2017-02-26 NOTE — Patient Instructions (Signed)

## 2017-02-27 ENCOUNTER — Ambulatory Visit (INDEPENDENT_AMBULATORY_CARE_PROVIDER_SITE_OTHER): Payer: Medicaid Other

## 2017-02-27 DIAGNOSIS — Z3492 Encounter for supervision of normal pregnancy, unspecified, second trimester: Secondary | ICD-10-CM | POA: Diagnosis not present

## 2017-03-02 LAB — URINE CULTURE, OB REFLEX

## 2017-03-02 LAB — CULTURE, OB URINE

## 2017-03-05 ENCOUNTER — Encounter: Payer: Self-pay | Admitting: Certified Nurse Midwife

## 2017-03-14 ENCOUNTER — Encounter: Payer: Self-pay | Admitting: Certified Nurse Midwife

## 2017-03-27 ENCOUNTER — Ambulatory Visit (INDEPENDENT_AMBULATORY_CARE_PROVIDER_SITE_OTHER): Payer: Medicaid Other | Admitting: Certified Nurse Midwife

## 2017-03-27 VITALS — BP 120/59 | HR 80 | Wt 146.8 lb

## 2017-03-27 DIAGNOSIS — Z3482 Encounter for supervision of other normal pregnancy, second trimester: Secondary | ICD-10-CM

## 2017-03-27 DIAGNOSIS — Z87440 Personal history of urinary (tract) infections: Secondary | ICD-10-CM

## 2017-03-27 DIAGNOSIS — R8299 Other abnormal findings in urine: Secondary | ICD-10-CM

## 2017-03-27 DIAGNOSIS — R82998 Other abnormal findings in urine: Secondary | ICD-10-CM

## 2017-03-27 LAB — POCT URINALYSIS DIPSTICK
BILIRUBIN UA: NEGATIVE
Blood, UA: NEGATIVE
Glucose, UA: NEGATIVE
Ketones, UA: NEGATIVE
NITRITE UA: NEGATIVE
PROTEIN UA: NEGATIVE
Spec Grav, UA: 1.015 (ref 1.010–1.025)
Urobilinogen, UA: 0.2 E.U./dL
pH, UA: 8 (ref 5.0–8.0)

## 2017-03-27 NOTE — Progress Notes (Signed)
ROB, doing well. Discussed 1 hr GTT, CBC, BTC, and TDap at next visit also discussed having a visit with [facility] Urine culture today. U/s next visit to re evaluate for placenta position. ROB in 4 wks.  Doreene BurkeAnnie Delphine Sizemore, CNM

## 2017-03-27 NOTE — Patient Instructions (Signed)

## 2017-03-30 LAB — URINE CULTURE

## 2017-04-01 ENCOUNTER — Observation Stay
Admission: EM | Admit: 2017-04-01 | Discharge: 2017-04-01 | Disposition: A | Discharge status: Home / Self Care | Payer: Medicaid Other | Attending: Certified Nurse Midwife | Admitting: Certified Nurse Midwife

## 2017-04-01 ENCOUNTER — Encounter: Payer: Self-pay | Admitting: Certified Nurse Midwife

## 2017-04-01 ENCOUNTER — Observation Stay: Payer: Medicaid Other

## 2017-04-01 DIAGNOSIS — O4442 Low lying placenta NOS or without hemorrhage, second trimester: Principal | ICD-10-CM | POA: Insufficient documentation

## 2017-04-01 DIAGNOSIS — Z885 Allergy status to narcotic agent status: Secondary | ICD-10-CM | POA: Insufficient documentation

## 2017-04-01 DIAGNOSIS — O4692 Antepartum hemorrhage, unspecified, second trimester: Secondary | ICD-10-CM | POA: Diagnosis present

## 2017-04-01 DIAGNOSIS — Z3A25 25 weeks gestation of pregnancy: Secondary | ICD-10-CM | POA: Diagnosis not present

## 2017-04-01 DIAGNOSIS — O4690 Antepartum hemorrhage, unspecified, unspecified trimester: Secondary | ICD-10-CM | POA: Diagnosis present

## 2017-04-01 LAB — URINALYSIS, ROUTINE W REFLEX MICROSCOPIC
BACTERIA UA: NONE SEEN
Bilirubin Urine: NEGATIVE
Glucose, UA: NEGATIVE mg/dL
Hgb urine dipstick: NEGATIVE
KETONES UR: NEGATIVE mg/dL
Nitrite: NEGATIVE
PROTEIN: NEGATIVE mg/dL
Specific Gravity, Urine: 1.009 (ref 1.005–1.030)
pH: 8 (ref 5.0–8.0)

## 2017-04-01 LAB — WET PREP, GENITAL
CLUE CELLS WET PREP: NONE SEEN
Sperm: NONE SEEN
TRICH WET PREP: NONE SEEN
YEAST WET PREP: NONE SEEN

## 2017-04-01 MED ORDER — CALCIUM CARBONATE ANTACID 500 MG PO CHEW: 2.0000 | CHEWABLE_TABLET | ORAL | Status: DC | PRN

## 2017-04-01 MED ORDER — ACETAMINOPHEN 325 MG PO TABS: 650.0000 mg | ORAL_TABLET | ORAL | Status: DC | PRN

## 2017-04-01 NOTE — Discharge Summary (Signed)
Obstetric Discharge Summary  Patient ID: Rosendo GrosChassidi L Railsback MRN: [id number] DOB/AGE: [birth year] 30 y.o.   Date of Admission: 04/01/2017 Serafina RoyalsMichelle Anaisha Mago, [name](A. Cherry, MD)  Date of Discharge: 04/01/2017 Serafina RoyalsMichelle Makih Stefanko, [name](A. Cherry, MD)  Admitting Diagnosis: Observation at 7777w0d  Secondary Diagnosis: Vaginal bleeding, hx marginal previa  Discharge Diagnosis: No other diagnosis   Antepartum Procedures: Ultrasound   Brief Hospital Course   L&D OB Triage Note  Rosendo GrosChassidi L Oyervides is a 31 y.o. 364P1021 female at 4877w0d, EDD Estimated Date of Delivery: 07/15/17 who presented to triage for complaints of vaginal spotting with wiping x 1 day. History significant for marginal previa on anatomy scan.   Last intercourse two (2) to three (3) weeks ago. No active bleeding now. Denies leakage of fluid or abdominal pain. Fetal movement present.   Denies difficulty breathing or respiratory distress, chest pain, dysuria, and leg pain or swelling.   Pt reports decreased water intake over the last week.   Objective:   Vital signs stable  Physical Exam  Constitutional: She is oriented to person, place, and time. She appears well-developed.  Genitourinary: Vaginal discharge found.  Cervix is parous.  HENT:  Head: Normocephalic.  Eyes: EOM are normal.  Neck: Normal range of motion.  Cardiovascular: Normal rate.   Pulmonary/Chest: Effort normal.  Abdominal: Soft.  Musculoskeletal: Normal range of motion.  Neurological: She is alert and oriented to person, place, and time.  Skin: Skin is warm and dry.  Psychiatric: She has a normal mood and affect.   SVE: Deferred, visually closed.   FHR:  Baseline Rate (A): 150 bpm Variability: Moderate Accelerations: 10 x 10 Decelerations: None   Contraction Frequency (min): 1   Imaging:   CLINICAL DATA:  Second trimester vaginal bleeding. Marginal placenta previa on outside ultrasound. Current assigned gestational age of [redacted] weeks 3  days.  EXAM: LIMITED OBSTETRIC ULTRASOUND AND TRANSVAGINAL OBSTETRIC ULTRASOUND  FINDINGS: Number of Fetuses: 1  Heart Rate:  153 bpm  Movement: Yes  Presentation: Cephalic  Placental Location: Posterior ; no abruption visualized.  Previa: Low lying, with inferior margin approximately 2.7 cm from the internal cervical os.  Amniotic Fluid (Subjective): Within normal limits.  BPD:  6.3cm 25w  4d  MATERNAL FINDINGS:  Cervix:  Appears closed.  Length measures 5.8 cm transvaginally.  Uterus/Adnexae:  No abnormality visualized.  IMPRESSION: Single living intrauterine fetus in cephalic presentation.  Low lying posterior placenta, with inferior margin approximately 2.7 cm from internal os. No placental abruption visualized. Continued follow-up of placental position recommend in 8 weeks.  This exam is performed on an emergent basis and does not comprehensively evaluate fetal size, dating, or anatomy; follow-up complete OB US should be considered if further fetal assessment is warranted.  Labs:  Wet prep:   Yeast Wet Prep HPF POC NONE SEEN NONE SEEN   Trich, Wet Prep NONE SEEN NONE SEEN   Clue Cells Wet Prep HPF POC NONE SEEN NONE SEEN   WBC, Wet Prep HPF POC NONE SEEN FEW    Sperm  NONE SEEN    Urinalysis    Component Value Date/Time   COLORURINE YELLOW (A) 04/01/2017 1140   APPEARANCEUR CLEAR (A) 04/01/2017 1140   LABSPEC 1.009 04/01/2017 1140   PHURINE 8.0 04/01/2017 1140   GLUCOSEU NEGATIVE 04/01/2017 1140   HGBUR NEGATIVE 04/01/2017 1140   BILIRUBINUR NEGATIVE 04/01/2017 1140   KETONESUR NEGATIVE 04/01/2017 1140   PROTEINUR NEGATIVE 04/01/2017 1140   NITRITE NEGATIVE 04/01/2017 1140   LEUKOCYTESUR TRACE (  A) 04/01/2017 1140   Assessment:   31 year old, G4P1 at 25 weeks, low-lying placenta, NO ACTIVE BLEEDING  FHR Catergory I, Reactive NST  Plan:   NST performed was reviewed and was found to be reactive. US performed and reviewed.  Encouraged oral hydration with water or electrolyte replacement drinks. She was discharged home with bleeding/labor precautions.  Continue routine prenatal care. Follow up with OB/GYN as previously scheduled.   Discharge Instructions: Per After Visit Summary.  Activity: Advance as tolerated. Pelvic rest.  Also refer to After Visit Summary  Diet: Regular  Medications: Allergies as of 04/01/2017      Reactions   Tramadol Itching      Medication List    TAKE these medications   Prenatal Vitamins 0.8 MG tablet Take 1 tablet by mouth daily.      Outpatient follow up:  [facility]up Information    [facility]   Why:  Please keep scheduled appointment.  Return sooner if needed.  Contact information: [address]  [address] [place] [id number] [phone]         Discharged Condition: stable  Discharged to: home    Gunnar BullaJenkins Michelle Floetta Brickey, [place]

## 2017-04-01 NOTE — OB Triage Note (Signed)
Patient states vaginal bleeding started at 10:00 last night. States she only sees bleeding when she wipes in the bathroom. Denies recent intercourse or decreased fetal movement, unsure of leaking of fluids. States she had a gush of "something" on Monday and has been wearing panty liners since.

## 2017-04-03 ENCOUNTER — Encounter: Payer: Self-pay | Admitting: Certified Nurse Midwife

## 2017-04-24 ENCOUNTER — Ambulatory Visit (INDEPENDENT_AMBULATORY_CARE_PROVIDER_SITE_OTHER): Payer: Medicaid Other

## 2017-04-24 ENCOUNTER — Ambulatory Visit: Payer: Medicaid Other

## 2017-04-24 ENCOUNTER — Ambulatory Visit (INDEPENDENT_AMBULATORY_CARE_PROVIDER_SITE_OTHER): Payer: Medicaid Other | Admitting: Certified Nurse Midwife

## 2017-04-24 VITALS — BP 111/70 | HR 86 | Wt 154.6 lb

## 2017-04-24 DIAGNOSIS — Z3482 Encounter for supervision of other normal pregnancy, second trimester: Secondary | ICD-10-CM

## 2017-04-24 DIAGNOSIS — Z23 Encounter for immunization: Secondary | ICD-10-CM | POA: Diagnosis not present

## 2017-04-24 DIAGNOSIS — Z131 Encounter for screening for diabetes mellitus: Secondary | ICD-10-CM

## 2017-04-24 DIAGNOSIS — Z3403 Encounter for supervision of normal first pregnancy, third trimester: Secondary | ICD-10-CM

## 2017-04-24 DIAGNOSIS — Z130 Encounter for screening for diseases of the blood and blood-forming organs and certain disorders involving the immune mechanism: Secondary | ICD-10-CM

## 2017-04-24 NOTE — Progress Notes (Signed)
ROB-Pt doing well. Follow up US today-lowing lying placenta persists, will repeat US at 32 wks. Glucola and CBC today. TDaP given. [facility] doula brochure given. Blood consent reviewed and signed. Plans TOLAC and breastfeeding. Reviewed red flag symptoms and when to call. RTC x 2 weeks for ROB or sooner if needed.   ULTRASOUND REPORT  Location: [facility] Date of Service: 04/24/17  Indications: Placental Location Findings:  Mason Jim intrauterine pregnancy is visualized with FHR at 135 BPM.   Fetal presentation is breech, spine posterior.  Placenta: Posterior and still low lying with no change from Ultrasound performed [facility] on 04/01/17. Distance to cervical os is 2.7 cm today and was 2.7 cm on 04/01/17. AFI: Adequate for gestational age at 16.8 cm.  Anatomic survey of the fetal stomach, bladder, kidneys and lateral ventricle appear WNL. Gender: Female.   Impression: 1. Low lying placenta is again seen today on transvaginal ultrasound at 2.7 cm from the cervical os. Emergent ultrasound performed at [facility] on 04/01/17 showed Low lying placenta also at 2.7 cm from os. No change on today's exam.  Recommendations: 1.Clinical correlation with the patient's History and Physical Exam.

## 2017-04-24 NOTE — Progress Notes (Signed)
ROB- Patient feels well today with no complaints.  

## 2017-04-24 NOTE — Patient Instructions (Addendum)
Vaginal Birth After Cesarean Delivery Vaginal birth after cesarean delivery (VBAC) is giving birth vaginally after previously delivering a baby by a cesarean. In the past, if a woman had a cesarean delivery, all births afterward would be done by cesarean delivery. This is no longer true. It can be safe for the mother to try a vaginal delivery after having a cesarean delivery. It is important to discuss VBAC with your health care provider early in the pregnancy so you can understand the risks, benefits, and options. It will give you time to decide what is best in your particular case. The final decision about whether to have a VBAC or repeat cesarean delivery should be between you and your health care provider. Any changes in your health or your baby's health during your pregnancy may make it necessary to change your initial decision about VBAC. Women who plan to have a VBAC should check with their health care provider to be sure that:  The previous cesarean delivery was done with a low transverse uterine cut (incision) (not a vertical classical incision).  The birth canal is big enough for the baby.  There were no other operations on the uterus.  An electronic fetal monitor (EFM) will be on at all times during labor.  An operating room will be available and ready in case an emergency cesarean delivery is needed.  A health care provider and surgical nursing staff will be available at all times during labor to be ready to do an emergency delivery cesarean if necessary.  An anesthesiologist will be present in case an emergency cesarean delivery is needed.  The nursery is prepared and has adequate personnel and necessary equipment available to care for the baby in case of an emergency cesarean delivery. Benefits of VBAC  Shorter stay in the hospital.  Avoidance of risks associated with cesarean delivery, such as: ? Surgical complications, such as opening of the incision or hernia in the  incision. ? Injury to other organs. ? Fever. This can occur if an infection develops after surgery. It can also occur as a reaction to the medicine given to make you numb during the surgery.  Less blood loss and need for blood transfusions.  Lower risk of blood clots and infection.  Shorter recovery.  Decreased risk for having to remove the uterus (hysterectomy).  Decreased risk for the placenta to completely or partially cover the opening of the uterus (placenta previa) with a future pregnancy.  Decrease risk in future labor and delivery. Risks of a VBAC  Tearing (rupture) of the uterus. This is occurs in less than 1% of VBACs. The risk of this happening is higher if: ? Steps are taken to begin the labor process (induce labor) or stimulate or strengthen contractions (augment labor). ? Medicine is used to soften (ripen) the cervix.  Having to remove the uterus (hysterectomy) if it ruptures. VBAC should not be done if:  The previous cesarean delivery was done with a vertical (classical) or T-shaped incision or you do not know what kind of incision was made.  You had a ruptured uterus.  You have had certain types of surgery on your uterus, such as removal of uterine fibroids. Ask your health care provider about other types of surgeries that prevent you from having a VBAC.  You have certain medical or childbirth (obstetrical) problems.  There are problems with the baby.  You have had two previous cesarean deliveries and no vaginal deliveries. Other facts to know about VBAC:  It   is safe to have an epidural anesthetic with VBAC.  It is safe to turn the baby from a breech position (attempt an external cephalic version).  It is safe to try a VBAC with twins.  VBAC may not be successful if your baby weights 8.8 lb (4 kg) or more. However, weight predictions are not always accurate and should not be used alone to decide if VBAC is right for you.  There is an increased failure rate  if the time between the cesarean delivery and VBAC is less than 19 months.  Your health care provider may advise against a VBAC if you have preeclampsia (high blood pressure, protein in the urine, and swelling of face and extremities).  VBAC is often successful if you previously gave birth vaginally.  VBAC is often successful when the labor starts spontaneously before the due date.  Delivering a baby through a VBAC is similar to having a normal spontaneous vaginal delivery. This information is not intended to replace advice given to you by your health care provider. Make sure you discuss any questions you have with your health care provider. Document Released: 02/08/2007 Document Revised: 01/24/2016 Document Reviewed: 03/17/2013 Elsevier Interactive Patient Education  2018 [employer] After Cesarean Delivery A trial of labor after cesarean delivery (TOLAC) is when a woman tries to give birth vaginally after a previous cesarean delivery. TOLAC may be a safe and appropriate option for you depending on your medical history and other risk factors. When TOLAC is successful and you are able to have a vaginal delivery, this is called a vaginal birth after cesarean delivery (VBAC). Candidates for TOLAC TOLAC is possible for some women who:  Have undergone one or two prior cesarean deliveries in which the incision of the uterus was horizontal (low transverse).  Are carrying twins and have had one prior low transverse incision during a cesarean delivery.  Do not have a vertical (classical) uterine scar.  Have not had a tear in the wall of their uterus (uterine rupture).  TOLAC is also supported for women who meet appropriate criteria and:  Are under the age of 33 years.  Are tall and have a body mass index (BMI) of less than 30.  Have an unknown uterine scar.  Give birth in a facility equipped to handle an emergency cesarean delivery. This team should be able to handle  possible complications such as a uterine rupture.  Have thorough counseling about the benefits and risks of TOLAC.  Have discussed future pregnancy plans with their health care provider.  Plan to have several more pregnancies.  Most successful candidates for TOLAC:  Have had a successful vaginal delivery before or after their cesarean delivery.  Experience labor that begins naturally on or before the due date (40 weeks of gestation).  Do not have a very large (macrosomic) baby.  Had a prior cesarean delivery but are not currently experiencing factors that would prompt a cesarean delivery (such as a breech position).  Had only one prior cesarean delivery.  Had a prior cesarean delivery that was performed early in labor and not after full cervical dilation. TOLAC may be most appropriate for women who meet the above guidelines and who plan to have more pregnancies. TOLAC is not recommended for home births. Least successful candidates for TOLAC:  Have an induced labor with an unfavorable cervix. An unfavorable cervix is when the cervix is not dilating enough (among other factors).  Have never had a vaginal delivery.  Have  had more than two cesarean deliveries.  Have a pregnancy at more than 40 weeks of gestation.  Are pregnant with a baby with a suspected weight greater than 4,000 grams (8 pounds) and who have no prior history of a vaginal delivery.  Have closely spaced pregnancies. Suggested benefits of TOLAC  You may have a faster recovery time.  You may have a shorter stay in the hospital.  You may have less pain and fewer problems than with a cesarean delivery. Women who have a cesarean delivery have a higher chance of needing blood or getting a fever, an infection, or a blood clot in the legs. Suggested risks of TOLAC The highest risk of complications happens to women who attempt a TOLAC and fail. A failed TOLAC results in an unplanned cesarean delivery. Risks related to  [facility] or repeat cesarean deliveries include:  Blood loss.  Infection.  Blood clot.  Injury to surrounding tissues or organs.  Having to remove the uterus (hysterectomy).  Potential problems with the placenta (such as placenta previa or placenta accreta) in future pregnancies.  Although very rare, the main concerns with TOLAC are:  Rupture of the uterine scar from a past cesarean delivery.  Needing an emergency cesarean delivery.  Having a bad outcome for the baby (perinatal morbidity).  Where to find more information:  American Congress of Obstetricians and Gynecologists: www.acog.org  [employer] Nurse-Midwives: www.midwife.org This information is not intended to replace advice given to you by your health care provider. Make sure you discuss any questions you have with your health care provider. Document Released: 05/06/2011 Document Revised: 07/16/2016 Document Reviewed: 02/07/2013 Elsevier Interactive Patient Education  2018 [employer]. Breastfeeding Deciding to breastfeed is one of the best choices you can make for you and your baby. A change in hormones during pregnancy causes your breast tissue to grow and increases the number and size of your milk ducts. These hormones also allow proteins, sugars, and fats from your blood supply to make breast milk in your milk-producing glands. Hormones prevent breast milk from being released before your baby is born as well as prompt milk flow after birth. Once breastfeeding has begun, thoughts of your baby, as well as his or her sucking or crying, can stimulate the release of milk from your milk-producing glands. Benefits of breastfeeding For Your Baby  Your first milk (colostrum) helps your baby's digestive system function better.  There are antibodies in your milk that help your baby fight off infections.  Your baby has a lower incidence of asthma, allergies, and sudden infant death syndrome.  The nutrients in breast  milk are better for your baby than infant formulas and are designed uniquely for your baby's needs.  Breast milk improves your baby's brain development.  Your baby is less likely to develop other conditions, such as childhood obesity, asthma, or type 2 diabetes mellitus.  For You  Breastfeeding helps to create a very special bond between you and your baby.  Breastfeeding is convenient. Breast milk is always available at the correct temperature and costs nothing.  Breastfeeding helps to burn calories and helps you lose the weight gained during pregnancy.  Breastfeeding makes your uterus contract to its prepregnancy size faster and slows bleeding (lochia) after you give birth.  Breastfeeding helps to lower your risk of developing type 2 diabetes mellitus, osteoporosis, and breast or ovarian cancer later in life.  Signs that your baby is hungry Early Signs of Hunger  Increased alertness or activity.  Stretching.  Movement of the head from side to side.  Movement of the head and opening of the mouth when the corner of the mouth or cheek is stroked (rooting).  Increased sucking sounds, smacking lips, cooing, sighing, or squeaking.  Hand-to-mouth movements.  Increased sucking of fingers or hands.  Late Signs of Hunger  Fussing.  Intermittent crying.  Extreme Signs of Hunger Signs of extreme hunger will require calming and consoling before your baby will be able to breastfeed successfully. Do not wait for the following signs of extreme hunger to occur before you initiate breastfeeding:  Restlessness.  A loud, strong cry.  Screaming.  Breastfeeding basics Breastfeeding Initiation  Find a comfortable place to sit or lie down, with your neck and back well supported.  Place a pillow or rolled up blanket under your baby to bring him or her to the level of your breast (if you are seated). Nursing pillows are specially designed to help support your arms and your baby while  you breastfeed.  Make sure that your baby's abdomen is facing your abdomen.  Gently massage your breast. With your fingertips, massage from your chest wall toward your nipple in a circular motion. This encourages milk flow. You may need to continue this action during the feeding if your milk flows slowly.  Support your breast with 4 fingers underneath and your thumb above your nipple. Make sure your fingers are well away from your nipple and your baby's mouth.  Stroke your baby's lips gently with your finger or nipple.  When your baby's mouth is open wide enough, quickly bring your baby to your breast, placing your entire nipple and as much of the colored area around your nipple (areola) as possible into your baby's mouth. ? More areola should be visible above your baby's upper lip than below the lower lip. ? Your baby's tongue should be between his or her lower gum and your breast.  Ensure that your baby's mouth is correctly positioned around your nipple (latched). Your baby's lips should create a seal on your breast and be turned out (everted).  It is common for your baby to suck about 2-3 minutes in order to start the flow of breast milk.  Latching Teaching your baby how to latch on to your breast properly is very important. An improper latch can cause nipple pain and decreased milk supply for you and poor weight gain in your baby. Also, if your baby is not latched onto your nipple properly, he or she may swallow some air during feeding. This can make your baby fussy. Burping your baby when you switch breasts during the feeding can help to get rid of the air. However, teaching your baby to latch on properly is still the best way to prevent fussiness from swallowing air while breastfeeding. Signs that your baby has successfully latched on to your nipple:  Silent tugging or silent sucking, without causing you pain.  Swallowing heard between every 3-4 sucks.  Muscle movement above and in  front of his or her ears while sucking.  Signs that your baby has not successfully latched on to nipple:  Sucking sounds or smacking sounds from your baby while breastfeeding.  Nipple pain.  If you think your baby has not latched on correctly, slip your finger into the corner of your baby's mouth to break the suction and place it between your baby's gums. Attempt breastfeeding initiation again. Signs of Successful Breastfeeding Signs from your baby:  A gradual decrease in the number of  sucks or complete cessation of sucking.  Falling asleep.  Relaxation of his or her body.  Retention of a small amount of milk in his or her mouth.  Letting go of your breast by himself or herself.  Signs from you:  Breasts that have increased in firmness, weight, and size 1-3 hours after feeding.  Breasts that are softer immediately after breastfeeding.  Increased milk volume, as well as a change in milk consistency and color by the fifth day of breastfeeding.  Nipples that are not sore, cracked, or bleeding.  Signs That Your [place] is Getting Enough Milk  Wetting at least 1-2 diapers during the first 24 hours after birth.  Wetting at least 5-6 diapers every 24 hours for the first week after birth. The urine should be clear or pale yellow by 5 days after birth.  Wetting 6-8 diapers every 24 hours as your baby continues to grow and develop.  At least 3 stools in a 24-hour period by age 32 days. The stool should be soft and yellow.  At least 3 stools in a 24-hour period by age 26 days. The stool should be seedy and yellow.  No loss of weight greater than 10% of birth weight during the first 51 days of age.  Average weight gain of 4-7 ounces (113-198 g) per week after age 71 days.  Consistent daily weight gain by age 32 days, without weight loss after the age of 2 weeks.  After a feeding, your baby may spit up a small amount. This is common. Breastfeeding frequency and duration Frequent  feeding will help you make more milk and can prevent sore nipples and breast engorgement. Breastfeed when you feel the need to reduce the fullness of your breasts or when your baby shows signs of hunger. This is called "breastfeeding on demand." Avoid introducing a pacifier to your baby while you are working to establish breastfeeding (the first 4-6 weeks after your baby is born). After this time you may choose to use a pacifier. Research has shown that pacifier use during the first year of a baby's life decreases the risk of sudden infant death syndrome (SIDS). Allow your baby to feed on each breast as long as he or she wants. Breastfeed until your baby is finished feeding. When your baby unlatches or falls asleep while feeding from the first breast, offer the second breast. Because newborns are often sleepy in the first few weeks of life, you may need to awaken your baby to get him or her to feed. Breastfeeding times will vary from baby to baby. However, the following rules can serve as a guide to help you ensure that your baby is properly fed:  Newborns (babies 61 weeks of age or younger) may breastfeed every 1-3 hours.  Newborns should not go longer than 3 hours during the day or 5 hours during the night without breastfeeding.  You should breastfeed your baby a minimum of 8 times in a 24-hour period until you begin to introduce solid foods to your baby at around 46 months of age.  Breast milk pumping Pumping and storing breast milk allows you to ensure that your baby is exclusively fed your breast milk, even at times when you are unable to breastfeed. This is especially important if you are going back to work while you are still breastfeeding or when you are not able to be present during feedings. Your lactation consultant can give you guidelines on how long it is safe to store breast  milk. A breast pump is a machine that allows you to pump milk from your breast into a sterile bottle. The pumped breast  milk can then be stored in a refrigerator or freezer. Some breast pumps are operated by hand, while others use electricity. Ask your lactation consultant which type will work best for you. Breast pumps can be purchased, but some hospitals and breastfeeding support groups lease breast pumps on a monthly basis. A lactation consultant can teach you how to hand express breast milk, if you prefer not to use a pump. Caring for your breasts while you breastfeed Nipples can become dry, cracked, and sore while breastfeeding. The following recommendations can help keep your breasts moisturized and healthy:  Avoid using soap on your nipples.  Wear a supportive bra. Although not required, special nursing bras and tank tops are designed to allow access to your breasts for breastfeeding without taking off your entire bra or top. Avoid wearing underwire-style bras or extremely tight bras.  Air dry your nipples for 3-33minutes after each feeding.  Use only cotton bra pads to absorb leaked breast milk. Leaking of breast milk between feedings is normal.  Use lanolin on your nipples after breastfeeding. Lanolin helps to maintain your skin's normal moisture barrier. If you use pure lanolin, you do not need to wash it off before feeding your baby again. Pure lanolin is not toxic to your baby. You may also hand express a few drops of breast milk and gently massage that milk into your nipples and allow the milk to air dry.  In the first few weeks after giving birth, some women experience extremely full breasts (engorgement). Engorgement can make your breasts feel heavy, warm, and tender to the touch. Engorgement peaks within 3-5 days after you give birth. The following recommendations can help ease engorgement:  Completely empty your breasts while breastfeeding or pumping. You may want to start by applying warm, moist heat (in the shower or with warm water-soaked hand towels) just before feeding or pumping. This increases  circulation and helps the milk flow. If your baby does not completely empty your breasts while breastfeeding, pump any extra milk after he or she is finished.  Wear a snug bra (nursing or regular) or tank top for 1-2 days to signal your body to slightly decrease milk production.  Apply ice packs to your breasts, unless this is too uncomfortable for you.  Make sure that your baby is latched on and positioned properly while breastfeeding.  If engorgement persists after 48 hours of following these recommendations, contact your health care provider or a Advertising copywriter. Overall health care recommendations while breastfeeding  Eat healthy foods. Alternate between meals and snacks, eating 3 of each per day. Because what you eat affects your breast milk, some of the foods may make your baby more irritable than usual. Avoid eating these foods if you are sure that they are negatively affecting your baby.  Drink milk, fruit juice, and water to satisfy your thirst (about 10 glasses a day).  Rest often, relax, and continue to take your prenatal vitamins to prevent fatigue, stress, and anemia.  Continue breast self-awareness checks.  Avoid chewing and smoking tobacco. Chemicals from cigarettes that pass into breast milk and exposure to secondhand smoke may harm your baby.  Avoid alcohol and drug use, including marijuana. Some medicines that may be harmful to your baby can pass through breast milk. It is important to ask your health care provider before taking any medicine, including all  over-the-counter and prescription medicine as well as vitamin and herbal supplements. It is possible to become pregnant while breastfeeding. If birth control is desired, ask your health care provider about options that will be safe for your baby. Contact a health care provider if:  You feel like you want to stop breastfeeding or have become frustrated with breastfeeding.  You have painful breasts or  nipples.  Your nipples are cracked or bleeding.  Your breasts are red, tender, or warm.  You have a swollen area on either breast.  You have a fever or chills.  You have nausea or vomiting.  You have drainage other than breast milk from your nipples.  Your breasts do not become full before feedings by the fifth day after you give birth.  You feel sad and depressed.  Your baby is too sleepy to eat well.  Your baby is having trouble sleeping.  Your baby is wetting less than 3 diapers in a 24-hour period.  Your baby has less than 3 stools in a 24-hour period.  Your baby's skin or the white part of his or her eyes becomes yellow.  Your baby is not gaining weight by 17 days of age. Get help right away if:  Your baby is overly tired (lethargic) and does not want to wake up and feed.  Your baby develops an unexplained fever. This information is not intended to replace advice given to you by your health care provider. Make sure you discuss any questions you have with your health care provider. Document Released: 08/18/2005 Document Revised: 01/30/2016 Document Reviewed: 02/09/2013 Elsevier Interactive Patient Education  2017 [employer] Cord Blood Banking Information Cord blood banking is the process of collecting and storing the blood that is in the umbilical cord and placenta at the time of delivery. This blood contains stem cells, which can be used to treat many blood diseases, immune system disorders, and childhood cancers. Stem cells can also be used to research certain diseases and treatments. Many people who choose cord blood banking donate the blood. Donated blood can be used in lifesaving treatments or for research. Other people choose to store the blood privately. Blood that is stored privately can only be used with the person's permission. This option is often chosen if:  A family member needs a stem cell transplant.  The child is part of an ethnic minority.  The  child was conceived through in vitro fertilization.  What should I look for in a blood bank? A blood bank is the organization that coordinates cord blood banking. Make sure the cord blood bank that you use:  Is accredited.  Is financially stable.  Handles a large volume of cord blood samples.  Has a procedure in place for transport and storage.  Allows you the option of transferring your cord blood sample.  Has a procedure in place if the bank goes out of business.  Clearly states all costs and limits to future costs.  People who choose to donate cord blood should not need to pay for blood banking. People who keep the blood for private use will need to pay for the first (initial) storage and pay a fee each year (annual fee). Other fees may also apply. What are the risks of cord blood banking? There are no health risks associated with cord blood banking. It is considered safe. How should I prepare? You must schedule this process at least 4-6 weeks before you will be giving birth. How is the blood collected? The  blood is collected as soon as the baby has been delivered. Within 15 minutes of delivery, a health care provider will take these actions to collect the blood:  Clamp the umbilical cord at the top and bottom. This traps the blood in the umbilical cord.  Use a syringe or bag to collect the blood.  Insert needles into the placenta to collect (draw out) more blood.  What happens after the blood is collected? After the blood has been collected:  The blood will be sent to a blood bank.  The blood will be tested for genetic problems and infectious diseases. If the blood tests positive for a genetic problem or a disease, someone will contact you and let you know.  The blood will be frozen.  If your child develops a genetic condition, immune system disorder, or cancer, you will be responsible for contacting the blood bank and letting them know. This information is not intended  to replace advice given to you by your health care provider. Make sure you discuss any questions you have with your health care provider. Document Released: 02/05/2010 Document Revised: 01/24/2016 Document Reviewed: 02/05/2015 Elsevier Interactive Patient Education  2018 [employer]. [facility] 2018 Prenatal Education Class Schedule Register at [email or web address] in the Classes & Resources Link or call Mardi Mainland at [phone] 9:00a-5:00p M-F  Childbirth Preparation Certified Childbirth Educators teach this 5 week course.  Expectant parents are encouraged to take this class in their 3rd trimester, completing it by their 35-36th week. Meets in [facility], Lower Level.  Mondays Thursdays  7:00-9:00 p 7:00-9:00 p  July 23 - August 20 July 19 - August 16  September 17 - October 15 September 6 -October 4  November 5 - December 3 October 25 - November 29   No Class on Thanksgiving Day -November 22  Childbirth Preparation Refresher Course For those who have previously attended Prepared Childbirth Preparation classes, this class in incorporated into the 3rd and 4th classes in the Monday night childbirth series.  Course meets in the [facility]. Lower Level from 7:00p - 9:00p  August 6 & 13  October 1 & 8  November 19 & 26   Weekend Childbirth Aundria Mems Classes are held Saturday & Sunday, 1:00 5:00p Course meets in [facility], [place] Level  August 4 & 5  November 3 & 4    The [address] Free tours are held on the third Sunday of each month at 3 pm.  The tour meets in the third floor waiting area and will take approximately 30 minutes.  Tours are also included in Childbirth class series as well as Brother/Sister class.  An online virtual tour can be seen at [email or web address]         Breastfeeding & Infant Nutrition The course incorporates returning to work or school.  Breast milk collection and storage with basic  breastfeeding and infant nutrition. This two-class course is held the 2nd and 3rd Tuesday of each month from 7:00 -9:00 pm.  Course meets in the [facility] Lower level  June 12 & 19 July-No Class  August 14 & 21 September 11 &18  September 11 & 18 October 9 &16  November 13 & 20 December 11 & 18   [employer] welcomes any mother for a social outing with other Moms to share experiences and challenges in an informal setting.  Meets the 1st Thursday and 3rd Thursday 11:30a-1:00 pm of each month in [facility] 3rd  floor classroom.  No registration required.  Newborn Essentials This course covers bathing, diapering, swaddling and more with practice on lifelike dolls.  Participants will also learn safety tips and infant CPR (Not for certification).  It is held the 1st Wednesday of each month from 7:00p-9:00p in the [facility], Lower level.  June 6 July- No Class  August 1 September 5  October 3 November 7  December 7    Preparing Big Brother & Sister This one session course prepares children and their parents for the arrival of a new baby.  It is held on the 1st Tuesday of each month from 6:30p - 8:00p. Course meets in the [facility], Lower level.  July-No Class August 7  September 4 October 2  November 6 December 4   [place] for [employer]  Dads This nationally acclaimed class helps expecting and new dads with the basic skills and confidence to bond with their infants, support their mates, and provide a safe and healthy home environment for their new family. Classes are held the 2nd Saturday of every month from 9:00a - 12:00 noon.  Course meets in the [facility] Lower level.  June 9 August 11  October 13 No Class in December  DTaP Vaccine (Diphtheria, Tetanus, and Pertussis): What You Need to Know 1. Why get vaccinated? Diphtheria, tetanus, and pertussis are serious diseases caused by bacteria. Diphtheria and pertussis are spread from person to  person. Tetanus enters the body through cuts or wounds. DIPHTHERIA causes a thick covering in the back of the throat.  It can lead to breathing problems, paralysis, heart failure, and even death.  TETANUS (Lockjaw) causes painful tightening of the muscles, usually all over the body.  It can lead to "locking" of the jaw so the victim cannot open his mouth or swallow. Tetanus leads to death in up to 2 out of 10 cases.  PERTUSSIS (Whooping Cough) causes coughing spells so bad that it is hard for infants to eat, drink, or breathe. These spells can last for weeks.  It can lead to pneumonia, seizures (jerking and staring spells), brain damage, and death.  Diphtheria, tetanus, and pertussis vaccine (DTaP) can help prevent these diseases. Most children who are vaccinated with DTaP will be protected throughout childhood. Many more children would get these diseases if we stopped vaccinating. DTaP is a safer version of an older vaccine called DTP. DTP is no longer used in the [place]. 2. Who should get DTaP vaccine and when? Children should get 5 doses of DTaP vaccine, one dose at each of the following ages:  2 months  4 months  6 months  15-18 months  4-6 years  DTaP may be given at the same time as other vaccines. 3. Some children should not get DTaP vaccine or should wait  Children with minor illnesses, such as a cold, may be vaccinated. But children who are moderately or severely ill should usually wait until they recover before getting DTaP vaccine.  Any child who had a life-threatening allergic reaction after a dose of DTaP should not get another dose.  Any child who suffered a brain or nervous system disease within 7 days after a dose of DTaP should not get another dose.  Talk with your doctor if your child: ? had a seizure or collapsed after a dose of DTaP, ? cried non-stop for 3 hours or more after a dose of DTaP, ? had a fever over 105F after a dose of DTaP.  Ask your  doctor for more information. Some of these children should not get another dose of pertussis vaccine, but may get a vaccine without pertussis, called DT. 4. Older children and adults DTaP is not licensed for adolescents, adults, or children 19 years of age and older. But older people still need protection. A vaccine called Tdap is similar to DTaP. A single dose of Tdap is recommended for people 11 through 30 years of age. Another vaccine, called Td, protects against tetanus and diphtheria, but not pertussis. It is recommended every 10 years. There are separate Vaccine Information Statements for these vaccines. 5. What are the risks from DTaP vaccine? Getting diphtheria, tetanus, or pertussis disease is much riskier than getting DTaP vaccine. However, a vaccine, like any medicine, is capable of causing serious problems, such as severe allergic reactions. The risk of DTaP vaccine causing serious harm, or death, is extremely small. Mild problems (common)  Fever (up to about 1 child in 4)  Redness or swelling where the shot was given (up to about 1 child in 4)  Soreness or tenderness where the shot was given (up to about 1 child in 4) These problems occur more often after the 4th and 5th doses of the DTaP series than after earlier doses. Sometimes the 4th or 5th dose of DTaP vaccine is followed by swelling of the entire arm or leg in which the shot was given, lasting 1-7 days (up to about 1 child in 30). Other mild problems include:  Fussiness (up to about 1 child in 3)  Tiredness or poor appetite (up to about 1 child in 10)  Vomiting (up to about 1 child in 50) These problems generally occur 1-3 days after the shot. Moderate problems (uncommon)  Seizure (jerking or staring) (about 1 child out of 14,000)  Non-stop crying, for 3 hours or more (up to about 1 child out of 1,000)  High fever, over 105F (about 1 child out of 16,000) Severe problems (very rare)  Serious allergic reaction (less  than 1 out of a million doses)  Several other severe problems have been reported after DTaP vaccine. These include: ? Long-term seizures, coma, or lowered consciousness ? Permanent brain damage. These are so rare it is hard to tell if they are caused by the vaccine. Controlling fever is especially important for children who have had seizures, for any reason. It is also important if another family member has had seizures. You can reduce fever and pain by giving your child an aspirin-free pain reliever when the shot is given, and for the next 24 hours, following the package instructions. 6. What if there is a serious reaction? What should I look for? Look for anything that concerns you, such as signs of a severe allergic reaction, very high fever, or behavior changes. Signs of a severe allergic reaction can include hives, swelling of the face and throat, difficulty breathing, a fast heartbeat, dizziness, and weakness. These would start a few minutes to a few hours after the vaccination. What should I do?  If you think it is a severe allergic reaction or other emergency that can't wait, call 9-1-1 or get the person to the nearest hospital. Otherwise, call your doctor.  Afterward, the reaction should be reported to the Vaccine Adverse Event Reporting System (VAERS). Your doctor might file this report, or you can do it yourself through the VAERS web site at www.vaers.LAgents.no, or by calling 1-(212) 743-3325. ? VAERS is only for reporting reactions. They do not give  medical advice. 7. The National Vaccine Injury Compensation Program The [employer] Vaccine Injury Compensation Program (VICP) is a federal program that was created to compensate people who may have been injured by certain vaccines. Persons who believe they may have been injured by a vaccine can learn about the program and about filing a claim by calling 1-508 364 1705 or visiting the VICP website at SpiritualWord.at. 8. How can I  learn more?  Ask your doctor.  Call your local or state health department.  Contact the Centers for Disease Control and Prevention (CDC): ? Call 315 800 9390 (1-800-CDC-INFO) or ? Visit CDC's website at PicCapture.uy CDC DTaP Vaccine (Diphtheria, Tetanus, and Pertussis) VIS (01/15/06) This information is not intended to replace advice given to you by your health care provider. Make sure you discuss any questions you have with your health care provider. Document Released: 06/15/2006 Document Revised: 05/08/2016 Document Reviewed: 05/08/2016 Elsevier Interactive Patient Education  2017 [employer].

## 2017-04-25 LAB — CBC
HEMATOCRIT: 36 % (ref 34.0–46.6)
HEMOGLOBIN: 11.8 g/dL (ref 11.1–15.9)
MCH: 29.9 pg (ref 26.6–33.0)
MCHC: 32.8 g/dL (ref 31.5–35.7)
MCV: 91 fL (ref 79–97)
Platelets: 224 10*3/uL (ref 150–379)
RBC: 3.95 x10E6/uL (ref 3.77–5.28)
RDW: 13.5 % (ref 12.3–15.4)
WBC: 10.9 10*3/uL — ABNORMAL HIGH (ref 3.4–10.8)

## 2017-04-25 LAB — GLUCOSE TOLERANCE, 1 HOUR: Glucose, 1Hr PP: 145 mg/dL (ref 65–199)

## 2017-04-27 ENCOUNTER — Other Ambulatory Visit: Payer: Self-pay

## 2017-04-27 DIAGNOSIS — R7309 Other abnormal glucose: Secondary | ICD-10-CM

## 2017-04-27 NOTE — Progress Notes (Signed)
Please contact to schedule three (3) hour GTT. Thanks, JML

## 2017-04-29 ENCOUNTER — Other Ambulatory Visit: Payer: Medicaid Other

## 2017-04-29 DIAGNOSIS — R7309 Other abnormal glucose: Secondary | ICD-10-CM

## 2017-04-30 LAB — GESTATIONAL GLUCOSE TOLERANCE
GLUCOSE 1 HOUR GTT: 105 mg/dL (ref 65–179)
GLUCOSE 2 HOUR GTT: 91 mg/dL (ref 65–154)
GLUCOSE 3 HOUR GTT: 90 mg/dL (ref 65–139)
GLUCOSE FASTING: 71 mg/dL (ref 65–94)

## 2017-05-06 ENCOUNTER — Encounter: Payer: Self-pay | Admitting: Certified Nurse Midwife

## 2017-05-08 ENCOUNTER — Ambulatory Visit (INDEPENDENT_AMBULATORY_CARE_PROVIDER_SITE_OTHER): Payer: Medicaid Other | Admitting: Certified Nurse Midwife

## 2017-05-08 VITALS — BP 112/60 | HR 82 | Wt 153.7 lb

## 2017-05-08 DIAGNOSIS — Z3483 Encounter for supervision of other normal pregnancy, third trimester: Secondary | ICD-10-CM

## 2017-05-08 LAB — POCT URINALYSIS DIPSTICK
BILIRUBIN UA: NEGATIVE
GLUCOSE UA: NEGATIVE
KETONES UA: NEGATIVE
Nitrite, UA: NEGATIVE
PH UA: 7 (ref 5.0–8.0)
Protein, UA: NEGATIVE
RBC UA: NEGATIVE
SPEC GRAV UA: 1.01 (ref 1.010–1.025)
Urobilinogen, UA: 0.2 E.U./dL

## 2017-05-08 NOTE — Patient Instructions (Signed)
Trial of Labor After Cesarean Delivery A trial of labor after cesarean delivery (TOLAC) is when a woman tries to give birth vaginally after a previous cesarean delivery. TOLAC may be a safe and appropriate option for you depending on your medical history and other risk factors. When TOLAC is successful and you are able to have a vaginal delivery, this is called a vaginal birth after cesarean delivery (VBAC). Candidates for TOLAC TOLAC is possible for some women who:  Have undergone one or two prior cesarean deliveries in which the incision of the uterus was horizontal (low transverse).  Are carrying twins and have had one prior low transverse incision during a cesarean delivery.  Do not have a vertical (classical) uterine scar.  Have not had a tear in the wall of their uterus (uterine rupture).  TOLAC is also supported for women who meet appropriate criteria and:  Are under the age of 40 years.  Are tall and have a body mass index (BMI) of less than 30.  Have an unknown uterine scar.  Give birth in a facility equipped to handle an emergency cesarean delivery. This team should be able to handle possible complications such as a uterine rupture.  Have thorough counseling about the benefits and risks of TOLAC.  Have discussed future pregnancy plans with their health care provider.  Plan to have several more pregnancies.  Most successful candidates for TOLAC:  Have had a successful vaginal delivery before or after their cesarean delivery.  Experience labor that begins naturally on or before the due date (40 weeks of gestation).  Do not have a very large (macrosomic) baby.  Had a prior cesarean delivery but are not currently experiencing factors that would prompt a cesarean delivery (such as a breech position).  Had only one prior cesarean delivery.  Had a prior cesarean delivery that was performed early in labor and not after full cervical dilation. TOLAC may be most appropriate  for women who meet the above guidelines and who plan to have more pregnancies. TOLAC is not recommended for home births. Least successful candidates for TOLAC:  Have an induced labor with an unfavorable cervix. An unfavorable cervix is when the cervix is not dilating enough (among other factors).  Have never had a vaginal delivery.  Have had more than two cesarean deliveries.  Have a pregnancy at more than 40 weeks of gestation.  Are pregnant with a baby with a suspected weight greater than 4,000 grams (8 pounds) and who have no prior history of a vaginal delivery.  Have closely spaced pregnancies. Suggested benefits of TOLAC  You may have a faster recovery time.  You may have a shorter stay in the hospital.  You may have less pain and fewer problems than with a cesarean delivery. Women who have a cesarean delivery have a higher chance of needing blood or getting a fever, an infection, or a blood clot in the legs. Suggested risks of TOLAC The highest risk of complications happens to women who attempt a TOLAC and fail. A failed TOLAC results in an unplanned cesarean delivery. Risks related to TOLAC or repeat cesarean deliveries include:  Blood loss.  Infection.  Blood clot.  Injury to surrounding tissues or organs.  Having to remove the uterus (hysterectomy).  Potential problems with the placenta (such as placenta previa or placenta accreta) in future pregnancies.  Although very rare, the main concerns with TOLAC are:  Rupture of the uterine scar from a past cesarean delivery.  Needing an emergency   cesarean delivery.  Having a bad outcome for the baby (perinatal morbidity).  Where to find more information:  American Congress of Obstetricians and Gynecologists: www.acog.org  [employer]: www.midwife.org This information is not intended to replace advice given to you by your health care provider. Make sure you discuss any questions you have with  your health care provider. Document Released: 05/06/2011 Document Revised: 07/16/2016 Document Reviewed: 02/07/2013 Elsevier Interactive Patient Education  2018 [employer] How a Baby Grows During Pregnancy Pregnancy begins when a female's sperm enters a female's egg (fertilization). This happens in one of the tubes (fallopian tubes) that connect the ovaries to the womb (uterus). The fertilized egg is called an embryo until it reaches 10 weeks. From 10 weeks until birth, it is called a fetus. The fertilized egg moves down the fallopian tube to the uterus. Then it implants into the lining of the uterus and begins to grow. The developing fetus receives oxygen and nutrients through the pregnant woman's bloodstream and the tissues that grow (placenta) to support the fetus. The placenta is the life support system for the fetus. It provides nutrition and removes waste. Learning as much as you can about your pregnancy and how your baby is developing can help you enjoy the experience. It can also make you aware of when there might be a problem and when to ask questions. How long does a typical pregnancy last? A pregnancy usually lasts 280 days, or about 40 weeks. Pregnancy is divided into three trimesters:  First trimester: 0-13 weeks.  Second trimester: 14-27 weeks.  Third trimester: 28-40 weeks.  The day when your baby is considered ready to be born (full term) is your estimated date of delivery. How does my baby develop month by month? First month  The fertilized egg attaches to the inside of the uterus.  Some cells will form the placenta. Others will form the fetus.  The arms, legs, brain, spinal cord, lungs, and heart begin to develop.  At the end of the first month, the heart begins to beat.  Second month  The bones, inner ear, eyelids, hands, and feet form.  The genitals develop.  By the end of 8 weeks, all major organs are developing.  Third month  All of the internal organs  are forming.  Teeth develop below the gums.  Bones and muscles begin to grow. The spine can flex.  The skin is transparent.  Fingernails and toenails begin to form.  Arms and legs continue to grow longer, and hands and feet develop.  The fetus is about 3 in (7.6 cm) long.  Fourth month  The placenta is completely formed.  The external sex organs, neck, outer ear, eyebrows, eyelids, and fingernails are formed.  The fetus can hear, swallow, and move its arms and legs.  The kidneys begin to produce urine.  The skin is covered with a white waxy coating (vernix) and very fine hair (lanugo).  Fifth month  The fetus moves around more and can be felt for the first time (quickening).  The fetus starts to sleep and wake up and may begin to suck its finger.  The nails grow to the end of the fingers.  The organ in the digestive system that makes bile (gallbladder) functions and helps to digest the nutrients.  If your baby is a girl, eggs are present in her ovaries. If your baby is a boy, testicles start to move down into his scrotum.  Sixth month  The lungs are  formed, but the fetus is not yet able to breathe.  The eyes open. The brain continues to develop.  Your baby has fingerprints and toe prints. Your baby's hair grows thicker.  At the end of the second trimester, the fetus is about 9 in (22.9 cm) long.  Seventh month  The fetus kicks and stretches.  The eyes are developed enough to sense changes in light.  The hands can make a grasping motion.  The fetus responds to sound.  Eighth month  All organs and body systems are fully developed and functioning.  Bones harden and taste buds develop. The fetus may hiccup.  Certain areas of the brain are still developing. The skull remains soft.  Ninth month  The fetus gains about  lb (0.23 kg) each week.  The lungs are fully developed.  Patterns of sleep develop.  The fetus's head typically moves into a  head-down position (vertex) in the uterus to prepare for birth. If the buttocks move into a vertex position instead, the baby is breech.  The fetus weighs 6-9 lbs (2.72-4.08 kg) and is 19-20 in (48.26-50.8 cm) long.  What can I do to have a healthy pregnancy and help my baby develop? Eating and Drinking  Eat a healthy diet. ? Talk with your health care provider to make sure that you are getting the nutrients that you and your baby need. ? Visit www.DisposableNylon.be to learn about creating a healthy diet.  Gain a healthy amount of weight during pregnancy as advised by your health care provider. This is usually 25-35 pounds. You may need to: ? Gain more if you were underweight before getting pregnant or if you are pregnant with more than one baby. ? Gain less if you were overweight or obese when you got pregnant.  Medicines and Vitamins  Take prenatal vitamins as directed by your health care provider. These include vitamins such as folic acid, iron, calcium, and vitamin D. They are important for healthy development.  Take medicines only as directed by your health care provider. Read labels and ask a pharmacist or your health care provider whether over-the-counter medicines, supplements, and prescription drugs are safe to take during pregnancy.  Activities  Be physically active as advised by your health care provider. Ask your health care provider to recommend activities that are safe for you to do, such as walking or swimming.  Do not participate in strenuous or extreme sports.  Lifestyle  Do not drink alcohol.  Do not use any tobacco products, including cigarettes, chewing tobacco, or electronic cigarettes. If you need help quitting, ask your health care provider.  Do not use illegal drugs.  Safety  Avoid exposure to mercury, lead, or other heavy metals. Ask your health care provider about common sources of these heavy metals.  Avoid listeria infection during pregnancy. Follow  these precautions: ? Do not eat soft cheeses or deli meats. ? Do not eat hot dogs unless they have been warmed up to the point of steaming, such as in the microwave oven. ? Do not drink unpasteurized milk.  Avoid toxoplasmosis infection during pregnancy. Follow these precautions: ? Do not change your cat's litter box, if you have a cat. Ask someone else to do this for you. ? Wear gardening gloves while working in the yard.  General Instructions  Keep all follow-up visits as directed by your health care provider. This is important. This includes prenatal care and screening tests.  Manage any chronic health conditions. Work closely with your health  care provider to keep conditions, such as diabetes, under control.  How do I know if my baby is developing well? At each prenatal visit, your health care provider will do several different tests to check on your health and keep track of your baby's development. These include:  Fundal height. ? Your health care provider will measure your growing belly from top to bottom using a tape measure. ? Your health care provider will also feel your belly to determine your baby's position.  Heartbeat. ? An ultrasound in the first trimester can confirm pregnancy and show a heartbeat, depending on how far along you are. ? Your health care provider will check your baby's heart rate at every prenatal visit. ? As you get closer to your delivery date, you may have regular fetal heart rate monitoring to make sure that your baby is not in distress.  Second trimester ultrasound. ? This ultrasound checks your baby's development. It also indicates your baby's gender.  What should I do if I have concerns about my baby's development? Always talk with your health care provider about any concerns that you may have. This information is not intended to replace advice given to you by your health care provider. Make sure you discuss any questions you have with your health  care provider. Document Released: 02/04/2008 Document Revised: 01/24/2016 Document Reviewed: 01/25/2014 Elsevier Interactive Patient Education  [employer].

## 2017-05-08 NOTE — Progress Notes (Addendum)
ROB, doing well. She has some contractions. Reviewed PTL and red flag symptoms. Discussed cord blood donation. She verbalizes understanding. Also reviewed B.C. For pp, she is considering Depo.She will follow up in 2 wks with Dr. Valentino [facility]herry [facility].   Doreene BurkeAnnie Nylee Barbuto, CNM

## 2017-05-21 ENCOUNTER — Ambulatory Visit (INDEPENDENT_AMBULATORY_CARE_PROVIDER_SITE_OTHER): Payer: Medicaid Other | Admitting: Obstetrics and Gynecology

## 2017-05-21 VITALS — BP 100/59 | HR 74 | Wt 164.1 lb

## 2017-05-21 DIAGNOSIS — Z98891 History of uterine scar from previous surgery: Secondary | ICD-10-CM

## 2017-05-21 DIAGNOSIS — Z3483 Encounter for supervision of other normal pregnancy, third trimester: Secondary | ICD-10-CM

## 2017-05-21 LAB — POCT URINALYSIS DIPSTICK
Bilirubin, UA: NEGATIVE
GLUCOSE UA: NEGATIVE
Ketones, UA: NEGATIVE
NITRITE UA: NEGATIVE
PH UA: 7 (ref 5.0–8.0)
PROTEIN UA: NEGATIVE
Spec Grav, UA: 1.01 (ref 1.010–1.025)
UROBILINOGEN UA: 0.2 U/dL

## 2017-05-21 NOTE — Progress Notes (Signed)
ROB: Doing well, no complaints.  Patient presents from midwifery care for counseling for TOLAC.  Previous C-section at 42 weeks for breech presentation discovered while in early labor. VBAC calculated score of 60.3%.  Counseled regarding TOLAC vs RCS; risks/benefits discussed in detail. All questions answered.  Patient elects for TOLAC (unless fetal size is significantly large, as last fetus was 8 lb 11 oz as patient notes she would like to preserve her vaginal integrity).  Also discussed possible options for managing breech presentation if current fetus also noted to be breech beginning at 36-37 weeks, including moxibustion, acupuncture, or ECV. Patient notes understanding.  RTC in 2 weeks with midwives.

## 2017-06-05 ENCOUNTER — Ambulatory Visit (INDEPENDENT_AMBULATORY_CARE_PROVIDER_SITE_OTHER): Payer: Medicaid Other | Admitting: Obstetrics and Gynecology

## 2017-06-05 VITALS — BP 110/66 | HR 76 | Wt 166.2 lb

## 2017-06-05 DIAGNOSIS — Z3493 Encounter for supervision of normal pregnancy, unspecified, third trimester: Secondary | ICD-10-CM

## 2017-06-05 DIAGNOSIS — N9089 Other specified noninflammatory disorders of vulva and perineum: Secondary | ICD-10-CM

## 2017-06-05 LAB — POCT URINALYSIS DIPSTICK
Bilirubin, UA: NEGATIVE
GLUCOSE UA: NEGATIVE
Ketones, UA: NEGATIVE
NITRITE UA: NEGATIVE
Protein, UA: NEGATIVE
RBC UA: NEGATIVE
Spec Grav, UA: 1.01 (ref 1.010–1.025)
UROBILINOGEN UA: 0.2 U/dL
pH, UA: 7 (ref 5.0–8.0)

## 2017-06-05 MED ORDER — TRIAMCINOLONE ACETONIDE 0.5 % EX OINT: 1.0000 "application " | TOPICAL_OINTMENT | Freq: Two times a day (BID) | CUTANEOUS | 0 refills | Status: DC

## 2017-06-05 NOTE — Progress Notes (Signed)
ROB- doing well; reports sore on perineum after sex last night, burns with contact and urination, Pelvic exam: VULVA: normal appearing vulva with no masses, tenderness or lesions, VAGINA: lesion at vaginal introitus 2mm x 3mm tender to touch, culture obtained, WET MOUNT done - results: negative for pathogens, normal epithelial cells. triamcilone cream prescribed while waiting for culture results.

## 2017-06-05 NOTE — Progress Notes (Signed)
ROB-pt is having a lot of pelvic pressure, had sexual intercourse last night feels very irriated and burning on the inside

## 2017-06-07 LAB — URINE CULTURE

## 2017-06-08 LAB — HERPES SIMPLEX VIRUS CULTURE

## 2017-06-09 ENCOUNTER — Other Ambulatory Visit: Payer: Self-pay | Admitting: Obstetrics and Gynecology

## 2017-06-09 DIAGNOSIS — B951 Streptococcus, group B, as the cause of diseases classified elsewhere: Secondary | ICD-10-CM | POA: Insufficient documentation

## 2017-06-09 DIAGNOSIS — O2340 Unspecified infection of urinary tract in pregnancy, unspecified trimester: Principal | ICD-10-CM

## 2017-06-09 MED ORDER — AMOXICILLIN 500 MG PO CAPS: 500.0000 mg | ORAL_CAPSULE | Freq: Three times a day (TID) | ORAL | 2 refills | Status: DC

## 2017-06-19 ENCOUNTER — Ambulatory Visit (INDEPENDENT_AMBULATORY_CARE_PROVIDER_SITE_OTHER): Payer: Medicaid Other | Admitting: Certified Nurse Midwife

## 2017-06-19 ENCOUNTER — Encounter: Payer: Self-pay | Admitting: Certified Nurse Midwife

## 2017-06-19 VITALS — BP 118/77 | HR 78 | Wt 170.0 lb

## 2017-06-19 DIAGNOSIS — N390 Urinary tract infection, site not specified: Secondary | ICD-10-CM

## 2017-06-19 DIAGNOSIS — Z3483 Encounter for supervision of other normal pregnancy, third trimester: Secondary | ICD-10-CM

## 2017-06-19 DIAGNOSIS — Z113 Encounter for screening for infections with a predominantly sexual mode of transmission: Secondary | ICD-10-CM

## 2017-06-19 LAB — POCT URINALYSIS DIPSTICK
BILIRUBIN UA: NEGATIVE
Glucose, UA: NEGATIVE
KETONES UA: NEGATIVE
NITRITE UA: NEGATIVE
PH UA: 7.5 (ref 5.0–8.0)
PROTEIN UA: NEGATIVE
Spec Grav, UA: <=1.005 — AB (ref 1.010–1.025)
Urobilinogen, UA: 0.2 E.U./dL

## 2017-06-19 NOTE — Patient Instructions (Signed)
Trial of Labor After Cesarean Delivery A trial of labor after cesarean delivery (TOLAC) is when a woman tries to give birth vaginally after a previous cesarean delivery. TOLAC may be a safe and appropriate option for you depending on your medical history and other risk factors. When TOLAC is successful and you are able to have a vaginal delivery, this is called a vaginal birth after cesarean delivery (VBAC). Candidates for TOLAC TOLAC is possible for some women who:  Have undergone one or two prior cesarean deliveries in which the incision of the uterus was horizontal (low transverse).  Are carrying twins and have had one prior low transverse incision during a cesarean delivery.  Do not have a vertical (classical) uterine scar.  Have not had a tear in the wall of their uterus (uterine rupture).  TOLAC is also supported for women who meet appropriate criteria and:  Are under the age of 40 years.  Are tall and have a body mass index (BMI) of less than 30.  Have an unknown uterine scar.  Give birth in a facility equipped to handle an emergency cesarean delivery. This team should be able to handle possible complications such as a uterine rupture.  Have thorough counseling about the benefits and risks of TOLAC.  Have discussed future pregnancy plans with their health care provider.  Plan to have several more pregnancies.  Most successful candidates for TOLAC:  Have had a successful vaginal delivery before or after their cesarean delivery.  Experience labor that begins naturally on or before the due date (40 weeks of gestation).  Do not have a very large (macrosomic) baby.  Had a prior cesarean delivery but are not currently experiencing factors that would prompt a cesarean delivery (such as a breech position).  Had only one prior cesarean delivery.  Had a prior cesarean delivery that was performed early in labor and not after full cervical dilation. TOLAC may be most appropriate  for women who meet the above guidelines and who plan to have more pregnancies. TOLAC is not recommended for home births. Least successful candidates for TOLAC:  Have an induced labor with an unfavorable cervix. An unfavorable cervix is when the cervix is not dilating enough (among other factors).  Have never had a vaginal delivery.  Have had more than two cesarean deliveries.  Have a pregnancy at more than 40 weeks of gestation.  Are pregnant with a baby with a suspected weight greater than 4,000 grams (8 pounds) and who have no prior history of a vaginal delivery.  Have closely spaced pregnancies. Suggested benefits of TOLAC  You may have a faster recovery time.  You may have a shorter stay in the hospital.  You may have less pain and fewer problems than with a cesarean delivery. Women who have a cesarean delivery have a higher chance of needing blood or getting a fever, an infection, or a blood clot in the legs. Suggested risks of TOLAC The highest risk of complications happens to women who attempt a TOLAC and fail. A failed TOLAC results in an unplanned cesarean delivery. Risks related to TOLAC or repeat cesarean deliveries include:  Blood loss.  Infection.  Blood clot.  Injury to surrounding tissues or organs.  Having to remove the uterus (hysterectomy).  Potential problems with the placenta (such as placenta previa or placenta accreta) in future pregnancies.  Although very rare, the main concerns with TOLAC are:  Rupture of the uterine scar from a past cesarean delivery.  Needing an emergency   cesarean delivery.  Having a bad outcome for the baby (perinatal morbidity).  Where to find more information:  American Congress of Obstetricians and Gynecologists: www.acog.org  American College of Nurse-Midwives: www.midwife.org This information is not intended to replace advice given to you by your health care provider. Make sure you discuss any questions you have with  your health care provider. Document Released: 05/06/2011 Document Revised: 07/16/2016 Document Reviewed: 02/07/2013 Elsevier Interactive Patient Education  2018 Elsevier Inc.  

## 2017-06-19 NOTE — Progress Notes (Signed)
ROB- Pt states she is doing well, would like to discuss her placenta flu-declines

## 2017-06-19 NOTE — Progress Notes (Signed)
ROB-Reports fatigue and increased pelvic pressures. Discussed home treatment measures including use of abdominal support. 36 week cultures collected, SVE deferred. Flu vaccine declined.Discussed growth US at 39 weeks for previous infant (8+11 lbs). Reviewed red flag symptoms and when to call. RTC x 1 week for ROB or sooner if needed.

## 2017-06-21 LAB — GC/CHLAMYDIA PROBE AMP
Chlamydia trachomatis, NAA: NEGATIVE
NEISSERIA GONORRHOEAE BY PCR: NEGATIVE

## 2017-06-21 LAB — URINE CULTURE: Organism ID, Bacteria: NO GROWTH

## 2017-06-26 ENCOUNTER — Ambulatory Visit (INDEPENDENT_AMBULATORY_CARE_PROVIDER_SITE_OTHER): Payer: Medicaid Other | Admitting: Certified Nurse Midwife

## 2017-06-26 ENCOUNTER — Encounter: Payer: Self-pay | Admitting: Certified Nurse Midwife

## 2017-06-26 VITALS — BP 105/62 | HR 82 | Wt 173.3 lb

## 2017-06-26 DIAGNOSIS — R82998 Other abnormal findings in urine: Secondary | ICD-10-CM

## 2017-06-26 DIAGNOSIS — Z3483 Encounter for supervision of other normal pregnancy, third trimester: Secondary | ICD-10-CM

## 2017-06-26 LAB — POCT URINALYSIS DIPSTICK
Bilirubin, UA: NEGATIVE
GLUCOSE UA: NEGATIVE
Ketones, UA: NEGATIVE
NITRITE UA: NEGATIVE
Protein, UA: NEGATIVE
RBC UA: NEGATIVE
Spec Grav, UA: <=1.005 — AB (ref 1.010–1.025)
Urobilinogen, UA: 0.2 E.U./dL
pH, UA: 7 (ref 5.0–8.0)

## 2017-06-26 NOTE — Patient Instructions (Signed)

## 2017-06-26 NOTE — Progress Notes (Signed)
ROB, doing well. No complaints. Endorses good fetal movement. Fetus moving on exam . Denies contractions. Reviewed Labor precautions. Follow up 1 wk.   Doreene BurkeAnnie Azalia Neuberger, CNM

## 2017-06-26 NOTE — Progress Notes (Signed)
ROB- no complaints.  

## 2017-06-26 NOTE — Addendum Note (Signed)
Addended by: Marchelle FolksMILLER, Catherene Kaleta G on: 06/26/2017 10:40 AM   Modules accepted: Orders

## 2017-06-28 LAB — URINE CULTURE: Organism ID, Bacteria: NO GROWTH

## 2017-07-01 ENCOUNTER — Encounter: Payer: Self-pay | Admitting: Certified Nurse Midwife

## 2017-07-03 ENCOUNTER — Encounter: Payer: Self-pay | Admitting: Certified Nurse Midwife

## 2017-07-03 ENCOUNTER — Ambulatory Visit (INDEPENDENT_AMBULATORY_CARE_PROVIDER_SITE_OTHER): Payer: Medicaid Other | Admitting: Certified Nurse Midwife

## 2017-07-03 VITALS — BP 99/60 | HR 89 | Wt 176.1 lb

## 2017-07-03 DIAGNOSIS — Z3483 Encounter for supervision of other normal pregnancy, third trimester: Secondary | ICD-10-CM

## 2017-07-03 LAB — POCT URINALYSIS DIPSTICK
Bilirubin, UA: NEGATIVE
Glucose, UA: NEGATIVE
KETONES UA: NEGATIVE
Nitrite, UA: NEGATIVE
PH UA: 7.5 (ref 5.0–8.0)
PROTEIN UA: NEGATIVE
UROBILINOGEN UA: 0.2 U/dL

## 2017-07-03 NOTE — Patient Instructions (Signed)

## 2017-07-03 NOTE — Progress Notes (Signed)
ROB

## 2017-07-03 NOTE — Progress Notes (Signed)
ROB doing well. No complaints. Endorses good fetal movement. Occasional contractions. Labor precautions reviewed. Follow up 1 wk.   Doreene BurkeAnnie Catalino Plascencia, CNM

## 2017-07-10 ENCOUNTER — Ambulatory Visit (INDEPENDENT_AMBULATORY_CARE_PROVIDER_SITE_OTHER): Payer: Medicaid Other | Admitting: Obstetrics and Gynecology

## 2017-07-10 VITALS — BP 136/77 | HR 86 | Wt 180.4 lb

## 2017-07-10 DIAGNOSIS — Z3493 Encounter for supervision of normal pregnancy, unspecified, third trimester: Secondary | ICD-10-CM

## 2017-07-10 LAB — POCT URINALYSIS DIPSTICK
BILIRUBIN UA: NEGATIVE
GLUCOSE UA: NEGATIVE
Ketones, UA: NEGATIVE
NITRITE UA: NEGATIVE
Protein, UA: NEGATIVE
Spec Grav, UA: 1.01 (ref 1.010–1.025)
Urobilinogen, UA: 0.2 E.U./dL
pH, UA: 6 (ref 5.0–8.0)

## 2017-07-10 NOTE — Progress Notes (Signed)
ROB- pt is having a lot of pelvic pressure, B feet swelling

## 2017-07-10 NOTE — Progress Notes (Signed)
ROB- doing well, discussed postdates care.     

## 2017-07-14 ENCOUNTER — Observation Stay
Admission: EM | Admit: 2017-07-14 | Discharge: 2017-07-14 | Disposition: A | Discharge status: Home / Self Care | Payer: Medicaid Other | Attending: Certified Nurse Midwife | Admitting: Certified Nurse Midwife

## 2017-07-14 ENCOUNTER — Other Ambulatory Visit: Payer: Self-pay

## 2017-07-14 DIAGNOSIS — O471 False labor at or after 37 completed weeks of gestation: Secondary | ICD-10-CM | POA: Insufficient documentation

## 2017-07-14 DIAGNOSIS — Z885 Allergy status to narcotic agent status: Secondary | ICD-10-CM | POA: Insufficient documentation

## 2017-07-14 DIAGNOSIS — Z3A39 39 weeks gestation of pregnancy: Secondary | ICD-10-CM | POA: Insufficient documentation

## 2017-07-14 DIAGNOSIS — O34219 Maternal care for unspecified type scar from previous cesarean delivery: Secondary | ICD-10-CM | POA: Diagnosis not present

## 2017-07-14 DIAGNOSIS — N858 Other specified noninflammatory disorders of uterus: Secondary | ICD-10-CM | POA: Diagnosis present

## 2017-07-14 LAB — URINALYSIS, ROUTINE W REFLEX MICROSCOPIC
Bilirubin Urine: NEGATIVE
GLUCOSE, UA: NEGATIVE mg/dL
Ketones, ur: NEGATIVE mg/dL
NITRITE: NEGATIVE
Protein, ur: 30 mg/dL — AB
SPECIFIC GRAVITY, URINE: 1.012 (ref 1.005–1.030)
pH: 7 (ref 5.0–8.0)

## 2017-07-14 LAB — OB RESULTS CONSOLE GBS: GBS: POSITIVE

## 2017-07-14 MED ORDER — PRENATAL MULTIVITAMIN CH: 1.0000 | ORAL_TABLET | Freq: Every day | ORAL | Status: DC

## 2017-07-14 MED ORDER — ZOLPIDEM TARTRATE 5 MG PO TABS: 5.0000 mg | ORAL_TABLET | Freq: Every evening | ORAL | Status: DC | PRN

## 2017-07-14 MED ORDER — ZOLPIDEM TARTRATE 5 MG PO TABS: 5.0000 mg | ORAL_TABLET | Freq: Every evening | ORAL | 0 refills | Status: DC | PRN

## 2017-07-14 MED ORDER — CALCIUM CARBONATE ANTACID 500 MG PO CHEW: 2.0000 | CHEWABLE_TABLET | ORAL | Status: DC | PRN

## 2017-07-14 MED ORDER — MORPHINE SULFATE (PF) 4 MG/ML IV SOLN
8.0000 mg | Freq: Once | INTRAVENOUS | Status: AC
  Administered 2017-07-14: 8 mg via INTRAMUSCULAR
  Filled 2017-07-14: qty 2

## 2017-07-14 MED ORDER — ACETAMINOPHEN 325 MG PO TABS: 650.0000 mg | ORAL_TABLET | ORAL | Status: DC | PRN

## 2017-07-14 MED ORDER — DOCUSATE SODIUM 100 MG PO CAPS: 100.0000 mg | ORAL_CAPSULE | Freq: Every day | ORAL | Status: DC

## 2017-07-14 MED ORDER — PROMETHAZINE HCL 25 MG/ML IJ SOLN
25.0000 mg | Freq: Once | INTRAMUSCULAR | Status: AC
  Administered 2017-07-14: 25 mg via INTRAMUSCULAR
  Filled 2017-07-14: qty 1

## 2017-07-14 NOTE — Discharge Summary (Signed)
Obstetric Discharge Summary  Patient ID: Pamela [name] MRN: [id number] DOB/AGE: [birth year] 31 y.o.   Date of Admission: 07/14/2017 Pamela RoyalsMichelle Sumeet [name], [name](A. Cherry, [facility])  Date of Discharge: 07/14/2017 Pamela RoyalsMichelle Pamela [name], [name](A. Cherry, MD)  Admitting Diagnosis: Observation at 7053w6d  Secondary Diagnosis: Previous cesarean section desires trial of labor  Discharge Diagnosis: No other diagnosis   Antepartum Procedures: Urine culture and NST  Brief Hospital Course   L&D OB Triage Note  Subjective:  Pamela [name] is a 31 y.o. [id number] female at 3353w6d, EDD Estimated Date of Delivery: 07/15/17 who presented to triage for complaints of uterine contractions since 0600, no relief with home treatment measures. She was treated with IM morphine and phenergan.   Denies difficulty breathing or respiratory distress, chest pain, abdominal pain, vaginal bleeding, leakage of fluid, dysuria, and leg pain or swelling. Endorses good fetal movement.   Objective:  Temp:  [98.5 F (36.9 C)] 98.5 F (36.9 C) (11/13 1041) Pulse Rate:  [81-93] 84 (11/13 1133) Resp:  [16] 16 (11/13 1041) BP: (122-132)/(66-92) 123/78 (11/13 1133) Weight:  [180 lb (81.6 kg)] 180 lb (81.6 kg) (11/13 1057)  Physical exam  General: Alert and oriented x 4, no apparent distress.   Abdomen: Soft, gravid, non-tender  Dilation: Fingertip Effacement (%): 50 Cervical Position: Posterior Station: Ballotable Presentation: Vertex Exam by:: JLW  NST INTERPRETATION: Indications: rule out uterine contractions  Mode: External Baseline Rate (A): 130 bpm Variability: Moderate Accelerations: 15 x 15 Decelerations: None Contraction Frequency (min): Irregular, occasional  Impression: reactive  Assessment:  31 year old  [id number] female at 5153w6d, NOT IN LABOR, previous cesarean section, desires trial of labor  FHR Catgory I  Plan:  NST performed was reviewed and was found to be reactive. She was discharged home  with bleeding/labor precautions.  Rx: Ambien, see orders. Continue routine prenatal care. Follow up with CNM on Friday as previously scheduled.   Medications:  Allergies as of 07/14/2017      Reactions   Tramadol Itching      Medication List    STOP taking these medications   Prenatal Vitamins 0.8 MG tablet     TAKE these medications   zolpidem 5 MG tablet Commonly known as:  AMBIEN Take 1 tablet (5 mg total) at bedtime as needed by mouth for sleep.      Outpatient follow up:  Postpartum contraception: Depo-Provera  Discharged Condition: stable  Discharged to: home   Gunnar BullaJenkins Pamela Rowland Ericsson, [place] 07/14/2017 3:06 PM

## 2017-07-14 NOTE — OB Triage Note (Signed)
Patient arrived on unit via wheelchair from ED registration with complaints of contractions since about 0600 this am.  Patient states she thinks contractions are about 10 minutes apart and rates 7/10 on 0-10 pain scale.  Denies any other complaints.

## 2017-07-14 NOTE — OB Triage Note (Signed)
Patient discharged home ambulatory and in stable condition accompanied by father of baby after verbalizing understanding of all written discharge instructions.  AVS provided to patient.

## 2017-07-16 ENCOUNTER — Other Ambulatory Visit: Payer: Self-pay | Admitting: *Deleted

## 2017-07-16 LAB — URINE CULTURE: Culture: >=100000 — AB

## 2017-07-16 MED ORDER — NITROFURANTOIN MONOHYD MACRO 100 MG PO CAPS: 100.0000 mg | ORAL_CAPSULE | Freq: Two times a day (BID) | ORAL | 0 refills | Status: DC

## 2017-07-17 ENCOUNTER — Ambulatory Visit (INDEPENDENT_AMBULATORY_CARE_PROVIDER_SITE_OTHER): Payer: Medicaid Other | Admitting: Certified Nurse Midwife

## 2017-07-17 ENCOUNTER — Ambulatory Visit (INDEPENDENT_AMBULATORY_CARE_PROVIDER_SITE_OTHER): Payer: Medicaid Other

## 2017-07-17 VITALS — BP 127/83 | HR 94 | Wt 178.6 lb

## 2017-07-17 DIAGNOSIS — Z3493 Encounter for supervision of normal pregnancy, unspecified, third trimester: Secondary | ICD-10-CM

## 2017-07-17 LAB — POCT URINALYSIS DIPSTICK
BILIRUBIN UA: NEGATIVE
Glucose, UA: NEGATIVE
Ketones, UA: NEGATIVE
Nitrite, UA: NEGATIVE
PH UA: 6.5 (ref 5.0–8.0)
Protein, UA: NEGATIVE
RBC UA: NEGATIVE
SPEC GRAV UA: 1.01 (ref 1.010–1.025)
UROBILINOGEN UA: 0.2 U/dL

## 2017-07-17 NOTE — Progress Notes (Signed)
ROB- growth US today, pt is having pelvic pressure

## 2017-07-17 NOTE — Patient Instructions (Signed)
Trial of Labor After Cesarean Delivery A trial of labor after cesarean delivery (TOLAC) is when a woman tries to give birth vaginally after a previous cesarean delivery. TOLAC may be a safe and appropriate option for you depending on your medical history and other risk factors. When TOLAC is successful and you are able to have a vaginal delivery, this is called a vaginal birth after cesarean delivery (VBAC). Candidates for TOLAC TOLAC is possible for some women who:  Have undergone one or two prior cesarean deliveries in which the incision of the uterus was horizontal (low transverse).  Are carrying twins and have had one prior low transverse incision during a cesarean delivery.  Do not have a vertical (classical) uterine scar.  Have not had a tear in the wall of their uterus (uterine rupture).  TOLAC is also supported for women who meet appropriate criteria and:  Are under the age of 40 years.  Are tall and have a body mass index (BMI) of less than 30.  Have an unknown uterine scar.  Give birth in a facility equipped to handle an emergency cesarean delivery. This team should be able to handle possible complications such as a uterine rupture.  Have thorough counseling about the benefits and risks of TOLAC.  Have discussed future pregnancy plans with their health care provider.  Plan to have several more pregnancies.  Most successful candidates for TOLAC:  Have had a successful vaginal delivery before or after their cesarean delivery.  Experience labor that begins naturally on or before the due date (40 weeks of gestation).  Do not have a very large (macrosomic) baby.  Had a prior cesarean delivery but are not currently experiencing factors that would prompt a cesarean delivery (such as a breech position).  Had only one prior cesarean delivery.  Had a prior cesarean delivery that was performed early in labor and not after full cervical dilation. TOLAC may be most appropriate  for women who meet the above guidelines and who plan to have more pregnancies. TOLAC is not recommended for home births. Least successful candidates for TOLAC:  Have an induced labor with an unfavorable cervix. An unfavorable cervix is when the cervix is not dilating enough (among other factors).  Have never had a vaginal delivery.  Have had more than two cesarean deliveries.  Have a pregnancy at more than 40 weeks of gestation.  Are pregnant with a baby with a suspected weight greater than 4,000 grams (8 pounds) and who have no prior history of a vaginal delivery.  Have closely spaced pregnancies. Suggested benefits of TOLAC  You may have a faster recovery time.  You may have a shorter stay in the hospital.  You may have less pain and fewer problems than with a cesarean delivery. Women who have a cesarean delivery have a higher chance of needing blood or getting a fever, an infection, or a blood clot in the legs. Suggested risks of TOLAC The highest risk of complications happens to women who attempt a TOLAC and fail. A failed TOLAC results in an unplanned cesarean delivery. Risks related to TOLAC or repeat cesarean deliveries include:  Blood loss.  Infection.  Blood clot.  Injury to surrounding tissues or organs.  Having to remove the uterus (hysterectomy).  Potential problems with the placenta (such as placenta previa or placenta accreta) in future pregnancies.  Although very rare, the main concerns with TOLAC are:  Rupture of the uterine scar from a past cesarean delivery.  Needing an emergency   cesarean delivery.  Having a bad outcome for the baby (perinatal morbidity).  Where to find more information:  American Congress of Obstetricians and Gynecologists: www.acog.org  American College of Nurse-Midwives: www.midwife.org This information is not intended to replace advice given to you by your health care provider. Make sure you discuss any questions you have with  your health care provider. Document Released: 05/06/2011 Document Revised: 07/16/2016 Document Reviewed: 02/07/2013 Elsevier Interactive Patient Education  2018 Elsevier Inc.  

## 2017-07-17 NOTE — Progress Notes (Signed)
ROB-Growth US today, findings reviewed with pt. Still desires TOLAC. Discussed home labor preparation techniques. Reviewed red flag symptoms and when to call. RTC x Monday for NST and [facility].   ULTRASOUND REPORT  Location: [facility] Date of Service:  07/17/17  Indications: Growth for Post Dates Findings:  Singleton intrauterine pregnancy is visualized with FHR at 135 BPM. Biometrics give an (U/S) Gestational age of 30 2/7 weeks and an (U/S) EDD of 07/15/17; this correlates with the clinically established EDD of 07/15/17.  Fetal presentation is vertex.  EFW: 4210 grams (9lb 5oz).  95th percentile.  AC measures 97th percentile. Placenta: Posterior and grade 2. AFI: 12.4 cm.   Impression: 1. 40 2/7 week Viable Singleton Intrauterine pregnancy by U/S. 2. (U/S) EDD is consistent with Clinically established (LMP) EDD of 07/15/17. 3. EFW: 4210 grams (9lb 5oz).  95th percentile.  AC measures 97th percentile.  Recommendations: 1.Clinical correlation with the patient's History and Physical Exam.

## 2017-07-20 ENCOUNTER — Encounter: Payer: Self-pay | Admitting: Certified Nurse Midwife

## 2017-07-20 ENCOUNTER — Ambulatory Visit (INDEPENDENT_AMBULATORY_CARE_PROVIDER_SITE_OTHER): Payer: Medicaid Other | Admitting: Certified Nurse Midwife

## 2017-07-20 ENCOUNTER — Other Ambulatory Visit: Payer: Medicaid Other

## 2017-07-20 VITALS — BP 126/73 | HR 82 | Wt 179.4 lb

## 2017-07-20 DIAGNOSIS — Z3483 Encounter for supervision of other normal pregnancy, third trimester: Secondary | ICD-10-CM

## 2017-07-20 LAB — POCT URINALYSIS DIPSTICK
BILIRUBIN UA: NEGATIVE
GLUCOSE UA: NEGATIVE
KETONES UA: NEGATIVE
Leukocytes, UA: NEGATIVE
Nitrite, UA: NEGATIVE
PH UA: 7 (ref 5.0–8.0)
Protein, UA: NEGATIVE
Spec Grav, UA: 1.01 (ref 1.010–1.025)
Urobilinogen, UA: 0.2 E.U./dL

## 2017-07-20 NOTE — Patient Instructions (Addendum)
Labor Induction Labor induction is when steps are taken to cause a pregnant woman to begin the labor process. Most women go into labor on their own between 37 weeks and 42 weeks of the pregnancy. When this does not happen or when there is a medical need, methods may be used to induce labor. Labor induction causes a pregnant woman's uterus to contract. It also causes the cervix to soften (ripen), open (dilate), and thin out (efface). Usually, labor is not induced before 39 weeks of the pregnancy unless there is a problem with the baby or mother. Before inducing labor, your health care provider will consider a number of factors, including the following:  The medical condition of you and the baby.  How many weeks along you are.  The status of the baby's lung maturity.  The condition of the cervix.  The position of the baby.  What are the reasons for labor induction? Labor may be induced for the following reasons:  The health of the baby or mother is at risk.  The pregnancy is overdue by 1 week or more.  The water breaks but labor does not start on its own.  The mother has a health condition or serious illness, such as high blood pressure, infection, placental abruption, or diabetes.  The amniotic fluid amounts are low around the baby.  The baby is distressed.  Convenience or wanting the baby to be born on a certain date is not a reason for inducing labor. What methods are used for labor induction? Several methods of labor induction may be used, such as:  Prostaglandin medicine. This medicine causes the cervix to dilate and ripen. The medicine will also start contractions. It can be taken by mouth or by inserting a suppository into the vagina.  Inserting a thin tube (catheter) with a balloon on the end into the vagina to dilate the cervix. Once inserted, the balloon is expanded with water, which causes the cervix to open.  Stripping the membranes. Your health care provider separates  amniotic sac tissue from the cervix, causing the cervix to be stretched and causing the release of a hormone called progesterone. This may cause the uterus to contract. It is often done during an office visit. You will be sent home to wait for the contractions to begin. You will then come in for an induction.  Breaking the water. Your health care provider makes a hole in the amniotic sac using a small instrument. Once the amniotic sac breaks, contractions should begin. This may still take hours to see an effect.  Medicine to trigger or strengthen contractions. This medicine is given through an IV access tube inserted into a vein in your arm.  All of the methods of induction, besides stripping the membranes, will be done in the hospital. Induction is done in the hospital so that you and the baby can be carefully monitored. How long does it take for labor to be induced? Some inductions can take up to 2-3 days. Depending on the cervix, it usually takes less time. It takes longer when you are induced early in the pregnancy or if this is your first pregnancy. If a mother is still pregnant and the induction has been going on for 2-3 days, either the mother will be sent home or a cesarean delivery will be needed. What are the risks associated with labor induction? Some of the risks of induction include:  Changes in fetal heart rate, such as too high, too low, or erratic.    Fetal distress.  Chance of infection for the mother and baby.  Increased chance of having a cesarean delivery.  Breaking off (abruption) of the placenta from the uterus (rare).  Uterine rupture (very rare).  When induction is needed for medical reasons, the benefits of induction may outweigh the risks. What are some reasons for not inducing labor? Labor induction should not be done if:  It is shown that your baby does not tolerate labor.  You have had previous surgeries on your uterus, such as a myomectomy or the removal of  fibroids.  Your placenta lies very low in the uterus and blocks the opening of the cervix (placenta previa).  Your baby is not in a head-down position.  The umbilical cord drops down into the birth canal in front of the baby. This could cut off the baby's blood and oxygen supply.  You have had a previous cesarean delivery.  There are unusual circumstances, such as the baby being extremely premature.  This information is not intended to replace advice given to you by your health care provider. Make sure you discuss any questions you have with your health care provider. Document Released: 01/07/2007 Document Revised: 01/24/2016 Document Reviewed: 03/17/2013 Elsevier Interactive Patient Education  2017 Elsevier Inc. Fetal Movement Counts Patient Name: ________________________________________________ Patient Due Date: ____________________ What is a fetal movement count? A fetal movement count is the number of times that you feel your baby move during a certain amount of time. This may also be called a fetal kick count. A fetal movement count is recommended for every pregnant woman. You may be asked to start counting fetal movements as early as week 28 of your pregnancy. Pay attention to when your baby is most active. You may notice your baby's sleep and wake cycles. You may also notice things that make your baby move more. You should do a fetal movement count:  When your baby is normally most active.  At the same time each day.  A good time to count movements is while you are resting, after having something to eat and drink. How do I count fetal movements? 1. Find a quiet, comfortable area. Sit, or lie down on your side. 2. Write down the date, the start time and stop time, and the number of movements that you felt between those two times. Take this information with you to your health care visits. 3. For 2 hours, count kicks, flutters, swishes, rolls, and jabs. You should feel at least 10  movements during 2 hours. 4. You may stop counting after you have felt 10 movements. 5. If you do not feel 10 movements in 2 hours, have something to eat and drink. Then, keep resting and counting for 1 hour. If you feel at least 4 movements during that hour, you may stop counting. Contact a health care provider if:  You feel fewer than 4 movements in 2 hours.  Your baby is not moving like he or she usually does. Date: ____________ Start time: ____________ Stop time: ____________ Movements: ____________ Date: ____________ Start time: ____________ Stop time: ____________ Movements: ____________ Date: ____________ Start time: ____________ Stop time: ____________ Movements: ____________ Date: ____________ Start time: ____________ Stop time: ____________ Movements: ____________ Date: ____________ Start time: ____________ Stop time: ____________ Movements: ____________ Date: ____________ Start time: ____________ Stop time: ____________ Movements: ____________ Date: ____________ Start time: ____________ Stop time: ____________ Movements: ____________ Date: ____________ Start time: ____________ Stop time: ____________ Movements: ____________ Date: ____________ Start time: ____________ Stop time: ____________ Movements: ____________   This information is not intended to replace advice given to you by your health care provider. Make sure you discuss any questions you have with your health care provider. Document Released: 09/17/2006 Document Revised: 04/16/2016 Document Reviewed: 09/27/2015 Elsevier Interactive Patient Education  2018 Elsevier Inc.  

## 2017-07-20 NOTE — Progress Notes (Signed)
ROB today with NST. Baseline 135, accelerations present, moderate variability, early deceleration x 1. Occasional contractions. Pt endorses good fetal movement. Discussed plan of care. [facility] today 1 cm/60%/-3. Pt encouraged to ambulate, have intercourse, use breat pump or nipple stimulation, use birthing ball to encourage labor. She will go to hospital on Friday 23rd for BPP. She is to return to office on Monday if labor has not ensued. Emphasized importance of fetal movement observation. She verbalizes understanding.    Doreene BurkeAnnie Robertha Staples, CNM

## 2017-07-20 NOTE — Progress Notes (Signed)
ROB- Pt has been having contractions, only question is some what of an idea of when she will be going into labor, pt has not started her macrobid yet

## 2017-07-21 ENCOUNTER — Other Ambulatory Visit: Payer: Self-pay

## 2017-07-21 ENCOUNTER — Ambulatory Visit (INDEPENDENT_AMBULATORY_CARE_PROVIDER_SITE_OTHER): Payer: Medicaid Other | Admitting: Obstetrics and Gynecology

## 2017-07-21 ENCOUNTER — Encounter
Admission: RE | Admit: 2017-07-21 | Discharge: 2017-07-21 | Disposition: A | Discharge status: Home / Self Care | Payer: Medicaid Other | Source: Ambulatory Visit | Attending: Obstetrics and Gynecology | Admitting: Obstetrics and Gynecology

## 2017-07-21 ENCOUNTER — Encounter: Payer: Self-pay | Admitting: Obstetrics and Gynecology

## 2017-07-21 VITALS — BP 145/80 | HR 84 | Wt 180.3 lb

## 2017-07-21 DIAGNOSIS — Z98891 History of uterine scar from previous surgery: Secondary | ICD-10-CM | POA: Diagnosis not present

## 2017-07-21 DIAGNOSIS — Z01818 Encounter for other preprocedural examination: Secondary | ICD-10-CM

## 2017-07-21 DIAGNOSIS — Z3483 Encounter for supervision of other normal pregnancy, third trimester: Secondary | ICD-10-CM | POA: Diagnosis not present

## 2017-07-21 HISTORY — DX: Panic disorder (episodic paroxysmal anxiety): F41.0

## 2017-07-21 LAB — TYPE AND SCREEN
ABO/RH(D): A POS
Antibody Screen: NEGATIVE
Extend sample reason: UNDETERMINED

## 2017-07-21 LAB — CBC
HCT: 39.5 % (ref 35.0–47.0)
Hemoglobin: 12.8 g/dL (ref 12.0–16.0)
MCH: 29.3 pg (ref 26.0–34.0)
MCHC: 32.3 g/dL (ref 32.0–36.0)
MCV: 90.6 fL (ref 80.0–100.0)
PLATELETS: 187 10*3/uL (ref 150–440)
RBC: 4.37 MIL/uL (ref 3.80–5.20)
RDW: 14.5 % (ref 11.5–14.5)
WBC: 10 10*3/uL (ref 3.6–11.0)

## 2017-07-21 LAB — POCT URINALYSIS DIPSTICK
BILIRUBIN UA: NEGATIVE
Blood, UA: NEGATIVE
GLUCOSE UA: NEGATIVE
Ketones, UA: NEGATIVE
LEUKOCYTES UA: NEGATIVE
NITRITE UA: NEGATIVE
Protein, UA: NEGATIVE
Spec Grav, UA: <=1.005 — AB (ref 1.010–1.025)
UROBILINOGEN UA: 0.2 U/dL
pH, UA: 7 (ref 5.0–8.0)

## 2017-07-21 MED ORDER — CEFAZOLIN SODIUM-DEXTROSE 2-4 GM/100ML-% IV SOLN
2.0000 g | INTRAVENOUS | Status: DC
  Filled 2017-07-21 (×4): qty 100

## 2017-07-21 NOTE — H&P (View-Only) (Signed)
History and Physical   HPI  Pamela [name] is a 31 y.o. [id number] at 981w6d Estimated Date of Delivery: 07/15/17 who is being admitted for  C-section   OB History  Obstetric History   G4   P1   T1   P0   A2   L1    SAB1   TAB1   Ectopic0   Multiple0   Live Births1     # Outcome Date GA Lbr Len/2nd Weight Sex Delivery Anes PTL Lv  4 Current           3 SAB 08/07/15 1727w1d       ND  2 Term 12/01/08 6525w0d  8 lb 11 oz (3.941 kg) F CS-LTranv EPI  LIV  1 TAB 2008        ND    Obstetric Comments  2016 (D&C)    PROBLEM LIST  Pregnancy complications or risks: Patient Active Problem List   Diagnosis Date Noted  . Alteration in comfort associated with uterine contractions 07/14/2017  . Indication for care in labor or delivery 07/14/2017  . GBS (group B streptococcus) UTI complicating pregnancy 06/09/2017  . Vaginal bleeding during pregnancy, antepartum 04/01/2017  . Umbilical hernia 01/28/2017  . History of cesarean delivery, currently pregnant 01/28/2017    Prenatal labs and studies: ABO, Rh: A/Positive/-- (03/26 1607) Antibody: Negative (03/26 1607) Rubella: 6.19 (03/26 1607) RPR: Non Reactive (03/26 1607)  HBsAg: Negative (03/26 1607)  HIV:    AVW:[id number]:Positive (11/13 0000)   Past Medical History:  Diagnosis Date  . Headache   . History of recurrent UTIs   . Positive urine pregnancy test    at [facility] 10/08/2016  . Umbilical hernia      Past Surgical History:  Procedure Laterality Date  . CESAREAN SECTION    . DILATION AND EVACUATION N/A 08/07/2015   Procedure: DILATATION AND EVACUATION;  Surgeon: Allie BossierMyra C Dove, MD;  Location: [facility];  Service: Gynecology;  Laterality: N/A;     Medications      Medication List        Accurate as of 07/21/17  2:24 PM. Always use your most recent med list.          nitrofurantoin (macrocrystal-monohydrate) 100 MG capsule Commonly known as:  MACROBID Take 1 capsule (100 mg total) 2 (two) times daily by mouth.         Allergies  Tramadol  Review of Systems  Pertinent items noted in HPI and remainder of comprehensive ROS otherwise negative.  Physical Exam  BP (!) 145/80   Pulse 84   Wt 180 lb 5 oz (81.8 kg)   LMP 10/08/2016 (Exact Date)   BMI 28.24 kg/m   Lungs:  CTA B Cardio: RRR without M/R/G Abd: Soft, gravid, NT Presentation: cephalic EXT: No C/C/ 1+ Edema DTRs: 2+ B CERVIX: not evaluated    See Prenatal records for more detailed PE.   FHR:  Baseline: 151 bpm  Toco: Uterine Contractions: None   Test Results  No results found for this or any previous visit (from the past 24 hour(s)).   Assessment   [id number] at 1581w6d Estimated Date of Delivery: 07/15/17    Patient Active Problem List   Diagnosis Date Noted  . Alteration in comfort associated with uterine contractions 07/14/2017  . Indication for care in labor or delivery 07/14/2017  . GBS (group B streptococcus) UTI complicating pregnancy 06/09/2017  . Vaginal bleeding during pregnancy, antepartum 04/01/2017  . Umbilical hernia 01/28/2017  .  History of cesarean delivery, currently pregnant 01/28/2017    Plan  1. Admit to L&D :   plan Cesarean delivery 2. EFM: -- Category 1 3.  Spinal anesthesia planned 4. Admission labs   Elonda Huskyavid J. Evans, M.D. 07/21/2017 2:24 PM

## 2017-07-21 NOTE — H&P (Signed)
  History and Physical   HPI  Pamela [name] is a 31 y.o. G4P1021 at [redacted]w[redacted]d Estimated Date of Delivery: 07/15/17 who is being admitted for  C-section   OB History  Obstetric History   G4   P1   T1   P0   A2   L1    SAB1   TAB1   Ectopic0   Multiple0   Live Births1     # Outcome Date GA Lbr Len/2nd Weight Sex Delivery Anes PTL Lv  4 Current           3 SAB 08/07/15 [redacted]w[redacted]d       ND  2 Term 12/01/08 [redacted]w[redacted]d  8 lb 11 oz (3.941 kg) F CS-LTranv EPI  LIV  1 TAB 2008        ND    Obstetric Comments  2016 (D&C)    PROBLEM LIST  Pregnancy complications or risks: Patient Active Problem List   Diagnosis Date Noted  . Alteration in comfort associated with uterine contractions 07/14/2017  . Indication for care in labor or delivery 07/14/2017  . GBS (group B streptococcus) UTI complicating pregnancy 06/09/2017  . Vaginal bleeding during pregnancy, antepartum 04/01/2017  . Umbilical hernia 01/28/2017  . History of cesarean delivery, currently pregnant 01/28/2017    Prenatal labs and studies: ABO, Rh: A/Positive/-- (03/26 1607) Antibody: Negative (03/26 1607) Rubella: 6.19 (03/26 1607) RPR: Non Reactive (03/26 1607)  HBsAg: Negative (03/26 1607)  HIV:    GBS:Positive (11/13 0000)   Past Medical History:  Diagnosis Date  . Headache   . History of recurrent UTIs   . Positive urine pregnancy test    at [facility] 10/08/2016  . Umbilical hernia      Past Surgical History:  Procedure Laterality Date  . CESAREAN SECTION    . DILATION AND EVACUATION N/A 08/07/2015   Procedure: DILATATION AND EVACUATION;  Surgeon: Myra C Dove, MD;  Location: [facility];  Service: Gynecology;  Laterality: N/A;     Medications      Medication List        Accurate as of 07/21/17  2:24 PM. Always use your most recent med list.          nitrofurantoin (macrocrystal-monohydrate) 100 MG capsule Commonly known as:  MACROBID Take 1 capsule (100 mg total) 2 (two) times daily by mouth.         Allergies  Tramadol  Review of Systems  Pertinent items noted in HPI and remainder of comprehensive ROS otherwise negative.  Physical Exam  BP (!) 145/80   Pulse 84   Wt 180 lb 5 oz (81.8 kg)   LMP 10/08/2016 (Exact Date)   BMI 28.24 kg/m   Lungs:  CTA B Cardio: RRR without M/R/G Abd: Soft, gravid, NT Presentation: cephalic EXT: No C/C/ 1+ Edema DTRs: 2+ B CERVIX: not evaluated    See Prenatal records for more detailed PE.   FHR:  Baseline: 151 bpm  Toco: Uterine Contractions: None   Test Results  No results found for this or any previous visit (from the past 24 hour(s)).   Assessment   G4P1021 at [redacted]w[redacted]d Estimated Date of Delivery: 07/15/17    Patient Active Problem List   Diagnosis Date Noted  . Alteration in comfort associated with uterine contractions 07/14/2017  . Indication for care in labor or delivery 07/14/2017  . GBS (group B streptococcus) UTI complicating pregnancy 06/09/2017  . Vaginal bleeding during pregnancy, antepartum 04/01/2017  . Umbilical hernia 01/28/2017  .   History of cesarean delivery, currently pregnant 01/28/2017    Plan  1. Admit to L&D :   plan Cesarean delivery 2. EFM: -- Category 1 3.  Spinal anesthesia planned 4. Admission labs   Elonda Huskyavid J. Shevy Yaney, M.D. 07/21/2017 2:24 PM

## 2017-07-21 NOTE — Progress Notes (Signed)
ROB: Patient has not decided on repeat cesarean delivery rather than TOLAC.  She presents today to discuss her cesarean.  Risk benefits of cesarean discussed in detail all of her questions were answered.  Preop performed.  Plan repeat cesarean delivery tomorrow.

## 2017-07-21 NOTE — Patient Instructions (Signed)
Your procedure is scheduled on: July 22, 2017 (Wednesday ) Report to EMERGENCY DEPARTMENT ARRIVAL TIME 7:00 AM  Remember: Instructions that are not followed completely may result in serious medical risk, up to and including death, or upon the discretion of your surgeon and anesthesiologist your surgery may need to be rescheduled.     _X__ 1. Do not eat food after midnight the night before your procedure.                 No gum chewing or hard candies. You may drink clear liquids up to 2 hours                 before you are scheduled to arrive for your surgery- DO not drink clear                 liquids within 2 hours of the start of your surgery.                 Clear Liquids include:  water, apple juice without pulp, clear carbohydrate                 drink such as Clearfast of Gartorade, Black Coffee or Tea (Do not add                 anything to coffee or tea).     _X__ 2.  No Alcohol for 24 hours before or after surgery.   _X__ 3.  Do Not Smoke or use e-cigarettes For 24 Hours Prior to Your Surgery.                 Do not use any chewable tobacco products for at least 6 hours prior to                 surgery.  ____  4.  Bring all medications with you on the day of surgery if instructed.   __X__  5.  Notify your doctor if there is any change in your medical condition      (cold, fever, infections).     Do not wear jewelry, make-up, hairpins, clips or nail polish. Do not wear lotions, powders, or perfumes.  Do not shave 48 hours prior to surgery. Men may shave face and neck. Do not bring valuables to the hospital.    [facility] not [facility] for any belongings or valuables.  Contacts, dentures or bridgework may not be worn into surgery. Leave your suitcase in the car. After surgery it may be brought to your room. For patients admitted to the hospital, discharge time is determined by your treatment team.   Patients discharged the day of surgery  will not be allowed to drive home.   Please read over the following fact sheets that you were given:             __X__ Take these medicines the morning of surgery with A SIP OF WATER:    1. BRING MEDICATION WITH YOU TO HOSPITAL (NITROFURANTOIN  MONO-MCR)  2.   3.   4.  5.  6.  ____ Fleet Enema (as directed)   __X__ Use CHG Soap as directed  ____ Use inhalers on the day of surgery  ____ Stop metformin 2 days prior to surgery    ____ Take 1/2 of usual insulin dose the night before surgery. No insulin the morning          of surgery.   __X__ Stop Coumadin/Plavix/aspirin on (NO  ASPIRIN )  _X___ Stop Anti-inflammatories on (NO ASPIRIN PRODUCTS )   ____ Stop supplements until after surgery.    ____ Bring C-Pap to the hospital.

## 2017-07-21 NOTE — Telephone Encounter (Signed)
Pt called to discuss scheduling of cesarean section. She will schedule pre op appointment with Dr. Clayburn PertEvan.   Doreene BurkeAnnie Torben Soloway, CNM

## 2017-07-22 ENCOUNTER — Inpatient Hospital Stay: Payer: Medicaid Other | Admitting: Registered Nurse

## 2017-07-22 ENCOUNTER — Encounter
Admission: RE | Disposition: A | Discharge status: Home / Self Care | Payer: Self-pay | Source: Ambulatory Visit | Attending: Obstetrics and Gynecology

## 2017-07-22 ENCOUNTER — Inpatient Hospital Stay
Admission: RE | Admit: 2017-07-22 | Discharge: 2017-07-24 | DRG: 788 | Disposition: A | Discharge status: Home / Self Care | Payer: Medicaid Other | Source: Ambulatory Visit | Attending: Obstetrics and Gynecology | Admitting: Obstetrics and Gynecology

## 2017-07-22 ENCOUNTER — Other Ambulatory Visit: Payer: Self-pay

## 2017-07-22 DIAGNOSIS — O99824 Streptococcus B carrier state complicating childbirth: Secondary | ICD-10-CM | POA: Diagnosis present

## 2017-07-22 DIAGNOSIS — O480 Post-term pregnancy: Secondary | ICD-10-CM | POA: Diagnosis present

## 2017-07-22 DIAGNOSIS — Z3A40 40 weeks gestation of pregnancy: Secondary | ICD-10-CM

## 2017-07-22 DIAGNOSIS — O34211 Maternal care for low transverse scar from previous cesarean delivery: Principal | ICD-10-CM | POA: Diagnosis present

## 2017-07-22 SURGERY — Surgical Case
Anesthesia: Spinal | Wound class: Clean Contaminated

## 2017-07-22 MED ORDER — MEPERIDINE HCL 25 MG/ML IJ SOLN: 6.2500 mg | INTRAMUSCULAR | Status: DC | PRN

## 2017-07-22 MED ORDER — SODIUM CHLORIDE 0.9% FLUSH: 3.0000 mL | INTRAVENOUS | Status: DC | PRN

## 2017-07-22 MED ORDER — NALBUPHINE HCL 10 MG/ML IJ SOLN: 5.0000 mg | INTRAMUSCULAR | Status: DC | PRN

## 2017-07-22 MED ORDER — ONDANSETRON HCL 4 MG/2ML IJ SOLN: 4.0000 mg | Freq: Once | INTRAMUSCULAR | Status: DC | PRN

## 2017-07-22 MED ORDER — NALBUPHINE HCL 10 MG/ML IJ SOLN: 5.0000 mg | Freq: Once | INTRAMUSCULAR | Status: DC | PRN

## 2017-07-22 MED ORDER — LIDOCAINE 5 % EX PTCH
MEDICATED_PATCH | CUTANEOUS | Status: AC
  Filled 2017-07-22: qty 1

## 2017-07-22 MED ORDER — MORPHINE SULFATE (PF) 0.5 MG/ML IJ SOLN
INTRAMUSCULAR | Status: DC | PRN
  Administered 2017-07-22: .1 mg via INTRATHECAL

## 2017-07-22 MED ORDER — FENTANYL CITRATE (PF) 100 MCG/2ML IJ SOLN: 25.0000 ug | INTRAMUSCULAR | Status: DC | PRN

## 2017-07-22 MED ORDER — LIDOCAINE 5 % EX PTCH
MEDICATED_PATCH | CUTANEOUS | Status: DC | PRN
  Administered 2017-07-22: 1 via TRANSDERMAL

## 2017-07-22 MED ORDER — NALOXONE HCL 0.4 MG/ML IJ SOLN: 0.4000 mg | INTRAMUSCULAR | Status: DC | PRN

## 2017-07-22 MED ORDER — SIMETHICONE 80 MG PO CHEW
80.0000 mg | CHEWABLE_TABLET | Freq: Four times a day (QID) | ORAL | Status: DC
  Administered 2017-07-22 – 2017-07-24 (×6): 80 mg via ORAL
  Filled 2017-07-22 (×8): qty 1

## 2017-07-22 MED ORDER — OXYCODONE HCL 5 MG PO TABS
5.0000 mg | ORAL_TABLET | Freq: Four times a day (QID) | ORAL | Status: DC | PRN
  Administered 2017-07-22 – 2017-07-24 (×5): 5 mg via ORAL
  Filled 2017-07-22 (×5): qty 1

## 2017-07-22 MED ORDER — DEXTROSE 5 % IV SOLN
2.0000 g | INTRAVENOUS | Status: AC
  Administered 2017-07-22: 2 g via INTRAVENOUS
  Filled 2017-07-22: qty 2000

## 2017-07-22 MED ORDER — BUPIVACAINE IN DEXTROSE 0.75-8.25 % IT SOLN
INTRATHECAL | Status: DC | PRN
  Administered 2017-07-22: 1.75 mL via INTRATHECAL

## 2017-07-22 MED ORDER — NALOXONE HCL 0.4 MG/ML IJ SOLN
1.0000 ug/kg/h | INTRAVENOUS | Status: DC | PRN
  Filled 2017-07-22: qty 5

## 2017-07-22 MED ORDER — SOD CITRATE-CITRIC ACID 500-334 MG/5ML PO SOLN
ORAL | Status: AC
  Administered 2017-07-22: 30 mL
  Filled 2017-07-22: qty 15

## 2017-07-22 MED ORDER — DIPHENHYDRAMINE HCL 25 MG PO CAPS
25.0000 mg | ORAL_CAPSULE | ORAL | Status: DC | PRN
  Administered 2017-07-22 – 2017-07-23 (×3): 25 mg via ORAL
  Filled 2017-07-22 (×3): qty 1

## 2017-07-22 MED ORDER — EPHEDRINE SULFATE 50 MG/ML IJ SOLN
INTRAMUSCULAR | Status: DC | PRN
  Administered 2017-07-22 (×2): 5 mg via INTRAVENOUS

## 2017-07-22 MED ORDER — LACTATED RINGERS IV SOLN: Freq: Once | INTRAVENOUS | Status: DC

## 2017-07-22 MED ORDER — ACETAMINOPHEN 325 MG PO TABS
650.0000 mg | ORAL_TABLET | ORAL | Status: DC | PRN
  Administered 2017-07-22 – 2017-07-23 (×3): 650 mg via ORAL
  Filled 2017-07-22 (×4): qty 2

## 2017-07-22 MED ORDER — DIPHENHYDRAMINE HCL 50 MG/ML IJ SOLN
12.5000 mg | INTRAMUSCULAR | Status: DC | PRN
  Administered 2017-07-22: 12.5 mg via INTRAVENOUS
  Filled 2017-07-22: qty 1

## 2017-07-22 MED ORDER — KETOROLAC TROMETHAMINE 30 MG/ML IJ SOLN: 30.0000 mg | Freq: Four times a day (QID) | INTRAMUSCULAR | Status: AC | PRN

## 2017-07-22 MED ORDER — ONDANSETRON HCL 4 MG/2ML IJ SOLN: 4.0000 mg | Freq: Three times a day (TID) | INTRAMUSCULAR | Status: DC | PRN

## 2017-07-22 MED ORDER — EPHEDRINE SULFATE 50 MG/ML IJ SOLN
INTRAMUSCULAR | Status: AC
  Filled 2017-07-22: qty 1

## 2017-07-22 MED ORDER — PHENYLEPHRINE HCL 10 MG/ML IJ SOLN
INTRAMUSCULAR | Status: AC
  Filled 2017-07-22: qty 1

## 2017-07-22 MED ORDER — LACTATED RINGERS IV SOLN: INTRAVENOUS | Status: DC

## 2017-07-22 MED ORDER — LACTATED RINGERS IV SOLN
INTRAVENOUS | Status: DC
  Administered 2017-07-22: 21:00:00 via INTRAVENOUS

## 2017-07-22 MED ORDER — OXYTOCIN 40 UNITS IN LACTATED RINGERS INFUSION - SIMPLE MED
INTRAVENOUS | Status: DC | PRN
  Administered 2017-07-22: 700 mL via INTRAVENOUS

## 2017-07-22 MED ORDER — PRENATAL MULTIVITAMIN CH
1.0000 | ORAL_TABLET | Freq: Every day | ORAL | Status: DC
  Administered 2017-07-23 – 2017-07-24 (×2): 1 via ORAL
  Filled 2017-07-22 (×2): qty 1

## 2017-07-22 MED ORDER — LIDOCAINE HCL (PF) 1 % IJ SOLN
INTRAMUSCULAR | Status: DC | PRN
  Administered 2017-07-22: 3 mL via SUBCUTANEOUS

## 2017-07-22 MED ORDER — FENTANYL CITRATE (PF) 100 MCG/2ML IJ SOLN
INTRAMUSCULAR | Status: AC
  Filled 2017-07-22: qty 2

## 2017-07-22 MED ORDER — LACTATED RINGERS IV SOLN
INTRAVENOUS | Status: DC
  Administered 2017-07-22: 09:00:00 via INTRAVENOUS

## 2017-07-22 MED ORDER — OXYTOCIN 40 UNITS IN LACTATED RINGERS INFUSION - SIMPLE MED
INTRAVENOUS | Status: AC
  Filled 2017-07-22: qty 1000

## 2017-07-22 MED ORDER — BUPIVACAINE IN DEXTROSE 0.75-8.25 % IT SOLN
INTRATHECAL | Status: AC
  Filled 2017-07-22: qty 2

## 2017-07-22 MED ORDER — ONDANSETRON HCL 4 MG/2ML IJ SOLN
INTRAMUSCULAR | Status: DC | PRN
  Administered 2017-07-22: 4 mg via INTRAVENOUS

## 2017-07-22 MED ORDER — ZOLPIDEM TARTRATE 5 MG PO TABS: 5.0000 mg | ORAL_TABLET | Freq: Every evening | ORAL | Status: DC | PRN

## 2017-07-22 MED ORDER — LACTATED RINGERS IV BOLUS (SEPSIS)
1000.0000 mL | Freq: Once | INTRAVENOUS | Status: AC
  Administered 2017-07-22: 1000 mL via INTRAVENOUS

## 2017-07-22 MED ORDER — ONDANSETRON HCL 4 MG/2ML IJ SOLN
INTRAMUSCULAR | Status: AC
  Filled 2017-07-22: qty 2

## 2017-07-22 MED ORDER — FENTANYL CITRATE (PF) 100 MCG/2ML IJ SOLN
INTRAMUSCULAR | Status: DC | PRN
  Administered 2017-07-22: 15 ug via INTRATHECAL

## 2017-07-22 MED ORDER — IBUPROFEN 600 MG PO TABS
600.0000 mg | ORAL_TABLET | Freq: Four times a day (QID) | ORAL | Status: DC
  Administered 2017-07-22 – 2017-07-24 (×8): 600 mg via ORAL
  Filled 2017-07-22 (×9): qty 1

## 2017-07-22 MED ORDER — PROPOFOL 10 MG/ML IV BOLUS
INTRAVENOUS | Status: AC
  Filled 2017-07-22: qty 20

## 2017-07-22 MED ORDER — MORPHINE SULFATE (PF) 0.5 MG/ML IJ SOLN
INTRAMUSCULAR | Status: AC
  Filled 2017-07-22: qty 10

## 2017-07-22 MED ORDER — OXYTOCIN 40 UNITS IN LACTATED RINGERS INFUSION - SIMPLE MED: 2.5000 [IU]/h | INTRAVENOUS | Status: AC

## 2017-07-22 MED ORDER — MENTHOL 3 MG MT LOZG
1.0000 | LOZENGE | OROMUCOSAL | Status: DC | PRN
  Filled 2017-07-22: qty 9

## 2017-07-22 MED ORDER — SODIUM CHLORIDE 0.9 % IV SOLN
INTRAVENOUS | Status: DC | PRN
  Administered 2017-07-22: 20 ug/min via INTRAVENOUS

## 2017-07-22 MED ORDER — SENNOSIDES-DOCUSATE SODIUM 8.6-50 MG PO TABS
2.0000 | ORAL_TABLET | ORAL | Status: DC
  Administered 2017-07-22 – 2017-07-23 (×2): 2 via ORAL
  Filled 2017-07-22 (×2): qty 2

## 2017-07-22 SURGICAL SUPPLY — 25 items
ADHESIVE MASTISOL STRL (MISCELLANEOUS) ×3 IMPLANT
BAG COUNTER SPONGE EZ (MISCELLANEOUS) ×2 IMPLANT
CANISTER SUCT 3000ML PPV (MISCELLANEOUS) ×3 IMPLANT
CELL SAVER LIPIGURD (MISCELLANEOUS) ×1 IMPLANT
CHLORAPREP W/TINT 26ML (MISCELLANEOUS) ×6 IMPLANT
COUNTER SPONGE BAG EZ (MISCELLANEOUS) ×1
DRSG TELFA 3X8 NADH (GAUZE/BANDAGES/DRESSINGS) ×3 IMPLANT
EXTRT SYSTEM ALEXIS 14CM (MISCELLANEOUS) ×3
GAUZE SPONGE 4X4 12PLY STRL (GAUZE/BANDAGES/DRESSINGS) ×3 IMPLANT
GLOVE INDICATOR 7.0 STRL GRN (GLOVE) ×3 IMPLANT
GLOVE ORTHO TXT STRL SZ7.5 (GLOVE) ×3 IMPLANT
GLOVE PROTEXIS LATEX SZ 7.5 (GLOVE) ×18 IMPLANT
GOWN STRL REUS W/ TWL LRG LVL3 (GOWN DISPOSABLE) ×2 IMPLANT
GOWN STRL REUS W/TWL LRG LVL3 (GOWN DISPOSABLE) ×4
KIT RM TURNOVER STRD PROC AR (KITS) ×3 IMPLANT
NS IRRIG 1000ML POUR BTL (IV SOLUTION) ×3 IMPLANT
PACK C SECTION AR (MISCELLANEOUS) ×3 IMPLANT
PAD OB MATERNITY 4.3X12.25 (PERSONAL CARE ITEMS) ×3 IMPLANT
PAD PREP 24X41 OB/GYN DISP (PERSONAL CARE ITEMS) ×3 IMPLANT
RTRCTR C-SECT PINK 25CM LRG (MISCELLANEOUS) ×3 IMPLANT
SPONGE LAP 18X18 5 PK (GAUZE/BANDAGES/DRESSINGS) ×3 IMPLANT
SUT VIC AB 0 CTX 36 (SUTURE) ×4
SUT VIC AB 0 CTX36XBRD ANBCTRL (SUTURE) ×2 IMPLANT
SUT VIC AB 1 CT1 36 (SUTURE) ×3 IMPLANT
SUT VICRYL+ 3-0 36IN CT-1 (SUTURE) ×6 IMPLANT

## 2017-07-22 NOTE — Anesthesia Post-op Follow-up Note (Signed)
Anesthesia QCDR form completed.        

## 2017-07-22 NOTE — Anesthesia Preprocedure Evaluation (Signed)
Anesthesia Evaluation  Patient identified by MRN, date of birth, ID band Patient awake    Reviewed: Allergy & Precautions, H&P , NPO status , Patient's Chart, lab work & pertinent test results, reviewed documented beta blocker date and time   History of Anesthesia Complications Negative for: history of anesthetic complications  Airway Mallampati: I  TM Distance: >3 FB Neck ROM: full    Dental  (+) Dental Advidsory Given, Caps, Teeth Intact   Pulmonary neg pulmonary ROS,           Cardiovascular Exercise Tolerance: Good negative cardio ROS       Neuro/Psych negative neurological ROS  negative psych ROS   GI/Hepatic negative GI ROS, Neg liver ROS,   Endo/Other  negative endocrine ROS  Renal/GU negative Renal ROS  negative genitourinary   Musculoskeletal   Abdominal   Peds  Hematology negative hematology ROS (+)   Anesthesia Other Findings Past Medical History: No date: Headache No date: History of recurrent UTIs No date: Panic attacks No date: Positive urine pregnancy test     Comment:  at [facility] 10/08/2016 No date: Umbilical hernia   Reproductive/Obstetrics (+) Pregnancy                             Anesthesia Physical Anesthesia Plan  ASA: II  Anesthesia Plan: Spinal   Post-op Pain Management:    Induction:   PONV Risk Score and Plan: 2 and Ondansetron  Airway Management Planned: Nasal Cannula  Additional Equipment:   Intra-op Plan:   Post-operative Plan:   Informed Consent: I have reviewed the patients History and Physical, chart, labs and discussed the procedure including the risks, benefits and alternatives for the proposed anesthesia with the patient or authorized representative who has indicated his/her understanding and acceptance.   Dental Advisory Given  Plan Discussed with: Anesthesiologist, CRNA and Surgeon  Anesthesia Plan Comments:          Anesthesia Quick Evaluation

## 2017-07-22 NOTE — Anesthesia Procedure Notes (Signed)
Spinal  Patient location during procedure: OR Start time: 07/22/2017 9:15 AM End time: 07/22/2017 9:19 AM Staffing Anesthesiologist: Martha Clan, MD Resident/CRNA: Hedda Slade, CRNA Performed: resident/CRNA  Preanesthetic Checklist Completed: patient identified, site marked, surgical consent, pre-op evaluation, timeout performed, IV checked, risks and benefits discussed and monitors and equipment checked Spinal Block Patient position: sitting Prep: ChloraPrep Patient monitoring: heart rate, continuous pulse ox, blood pressure and cardiac monitor Approach: midline Location: L4-5 Injection technique: single-shot Needle Needle type: Whitacre and Introducer  Needle gauge: 24 G Needle length: 9 cm Assessment Sensory level: T4 Additional Notes Negative paresthesia. Negative blood return. Positive free-flowing CSF. Expiration date of kit checked and confirmed. Patient tolerated procedure well, without complications.

## 2017-07-22 NOTE — Op Note (Addendum)
      OP NOTE  Date: 07/22/2017   10:36 AM Name Rosendo GrosChassidi L Suniga MR# [id number]  Preoperative Diagnosis: 1. Intrauterine pregnancy at 5631w0d Active Problems:   * No active hospital problems. *    Postoperative Diagnosis: 1. Intrauterine pregnancy at 7131w0d, delivered 2. Viable infant 3. Remainder same as pre-op   Procedure: 1. Repeat Low-Transverse Cesarean Section  Surgeon: Elonda Huskyavid J. Evans, MD  Assistant:  Willodean RosenthalLawhorn, M  CNM  Anesthesia: Spinal    EBL: 650 ml     Findings: 1) female infant, Apgar scores of 9    at 1 minute and 9    at 5 minutes and a birthweight of 155.2  ounces.    2) Normal uterus, tubes and ovaries.    Procedure:  The patient was prepped and draped in the supine position and placed under spinal anesthesia.  A transverse incision was made across the abdomen in a Pfannenstiel manner. If indicated the old scar was systematically removed with sharp dissection.  We carried the dissection down to the level of the fascia.  The fascia was incised in a curvilinear manner.  The fascia was then elevated from the rectus muscles with blunt and sharp dissection.  The rectus muscles were separated laterally exposing the peritoneum.  The peritoneum was carefully entered with care being taken to avoid bowel and bladder.  A self-retaining retractor was placed.  The visceral peritoneum was incised in a curvilinear fashion across the lower uterine segment creating a bladder flap. A transverse incision was made across the lower uterine segment and extended laterally and superiorly using the bandage scissors.  Artificial rupture membranes was performed and Clear fluid was noted.  The infant was delivered from the cephalic position.  A nuchal cord was not present. The cord was doubly clamped and cut. Cord blood was obtained if appropriate.  The infant was handed to the pediatric personnel  who then placed the infant under heat lamps where it was cleaned dried and re-suctioned. The  placenta was delivered. The hysterotomy incision was then identified on ring forceps.  The uterine cavity was cleaned with a moist lap sponge.  The hysterotomy incision was closed with a running interlocking suture of Vicryl.  Hemostasis was excellent.  Pitocin was run in the IV and the uterus was found to be firm. The posterior cul-de-sac and gutters were cleaned and inspected.  Hemostasis was noted.  The fascia was then closed with a running suture of #1 Vicryl.  Hemostasis of the subcutaneous tissues was obtained using the Bovie.  The subcutaneous tissues were closed with a running suture of 000 Vicryl.  A subcuticular suture was placed.  Steri-Strips were applied in the usual manner.  A pressure dressing was placed.  The patient went to the recovery room in stable condition.   Elonda Huskyavid J. Evans, M.D. 07/22/2017 10:36 AM

## 2017-07-22 NOTE — Transfer of Care (Signed)
Immediate Anesthesia Transfer of Care Note  Patient: Pamela [name]  Procedure(s) Performed: REPEAT CESAREAN SECTION (Female, [phone], 9lbs 11oz) (N/A )  Patient Location: PACU  Anesthesia Type:Spinal  Level of Consciousness: awake, alert  and oriented  Airway & Oxygen Therapy: Patient Spontanous Breathing  Post-op Assessment: Report given to RN and Post -op Vital signs reviewed and stable  Post vital signs: Reviewed and stable  Last Vitals:  Vitals:   07/22/17 0715 07/22/17 1042  BP: 129/77 110/72  Pulse: 91 69  Resp: 16 (!) 21  Temp: 36.8 C 36.4 C    Last Pain:  Vitals:   07/22/17 1042  TempSrc: Oral  PainSc:          Complications: No apparent anesthesia complications

## 2017-07-22 NOTE — Interval H&P Note (Signed)
History and Physical Interval Note:  07/22/2017 8:34 AM  Pamela [name]  has presented today for surgery, with the diagnosis of PRIOR C SECTION, POST DATES  The various methods of treatment have been discussed with the patient and family. After consideration of risks, benefits and other options for treatment, the patient has consented to  Procedure(s): REPEAT CESAREAN SECTION (N/A) as a surgical intervention .  The patient's history has been reviewed, patient examined, no change in status, stable for surgery.  I have reviewed the patient's chart and labs.  Questions were answered to the patient's satisfaction.     Brennan Baileyavid Evans

## 2017-07-23 MED ORDER — PHENYLEPHRINE HCL 10 MG/ML IJ SOLN
INTRAMUSCULAR | Status: AC
  Filled 2017-07-23: qty 1

## 2017-07-23 MED ORDER — EPHEDRINE SULFATE 50 MG/ML IJ SOLN
INTRAMUSCULAR | Status: AC
  Filled 2017-07-23: qty 1

## 2017-07-23 MED ORDER — BREAST MILK
ORAL | Status: DC
  Filled 2017-07-23 (×45): qty 1

## 2017-07-23 MED ORDER — PROPOFOL 10 MG/ML IV BOLUS
INTRAVENOUS | Status: AC
  Filled 2017-07-23: qty 20

## 2017-07-23 MED ORDER — LIDOCAINE HCL (PF) 2 % IJ SOLN
INTRAMUSCULAR | Status: AC
  Filled 2017-07-23: qty 10

## 2017-07-23 MED ORDER — SUCCINYLCHOLINE CHLORIDE 20 MG/ML IJ SOLN
INTRAMUSCULAR | Status: AC
  Filled 2017-07-23: qty 1

## 2017-07-23 NOTE — Progress Notes (Signed)
Patient ID: Pamela [name], female   DOB: Jun 23, 1986, 31 y.o.   MRN: [id number]    Progress Note - Cesarean Delivery  Pamela [name] is a 31 y.o. [id number] now PP day 1 s/p C-Section, Low Transverse .   Subjective:  Patient reports no problems with eating, bowel movements, voiding, or their wound  Objective:  Vital signs in last 24 hours: Temp:  [97.6 F (36.4 C)-99 F (37.2 C)] 98.3 F (36.8 C) (11/22 0731) Pulse Rate:  [69-101] 71 (11/22 0731) Resp:  [16-24] 18 (11/22 0731) BP: (101-134)/(52-86) 102/61 (11/22 0731) SpO2:  [98 %-100 %] 100 % (11/22 0731) Weight:  [176 lb (79.8 kg)] 176 lb (79.8 kg) (11/21 1935)  Physical Exam:  General: alert, cooperative and no distress Lochia: appropriate Uterine Fundus: firm Incision: Dressing intact-no drainage DVT Evaluation: No evidence of DVT seen on physical exam.    Data Review Recent Labs    07/21/17 1509  HGB 12.8  HCT 39.5    Assessment:  Active Problems:   Delivery by cesarean section using transverse incision of lower segment of uterus   Status post Cesarean section. Doing well postoperatively.     Plan:       Continue current care  Out of bed today, may shower, dressing removal.  Consider discharge tomorrow.    Elonda Huskyavid J. Melonee Gerstel, M.D. 07/23/2017 10:08 AM

## 2017-07-23 NOTE — Anesthesia Postprocedure Evaluation (Signed)
Anesthesia Post Note  Patient: Pamela [name]  Procedure(s) Performed: REPEAT CESAREAN SECTION (Female, [phone], 9lbs 11oz) (N/A )  Patient location during evaluation: PACU Anesthesia Type: Spinal Level of consciousness: oriented and awake and alert Pain management: pain level controlled Vital Signs Assessment: post-procedure vital signs reviewed and stable Respiratory status: spontaneous breathing, respiratory function stable and patient connected to nasal cannula oxygen Cardiovascular status: blood pressure returned to baseline and stable Postop Assessment: no headache, no backache and no apparent nausea or vomiting Anesthetic complications: no     Last Vitals:  Vitals:   07/23/17 0425 07/23/17 0731  BP: 101/61 102/61  Pulse: 71 71  Resp: 16 18  Temp: 36.7 C 36.8 C  SpO2: 98% 100%    Last Pain:  Vitals:   07/23/17 0731  TempSrc: Oral  PainSc:                  [name] S

## 2017-07-24 MED ORDER — SIMETHICONE 80 MG PO CHEW: 80.0000 mg | CHEWABLE_TABLET | Freq: Four times a day (QID) | ORAL | 0 refills | Status: DC

## 2017-07-24 MED ORDER — IBUPROFEN 600 MG PO TABS: 600.0000 mg | ORAL_TABLET | Freq: Four times a day (QID) | ORAL | 0 refills | Status: DC

## 2017-07-24 MED ORDER — OXYCODONE HCL 5 MG PO TABS: 5.0000 mg | ORAL_TABLET | Freq: Four times a day (QID) | ORAL | 0 refills | Status: DC | PRN

## 2017-07-24 MED ORDER — SENNOSIDES-DOCUSATE SODIUM 8.6-50 MG PO TABS: 2.0000 | ORAL_TABLET | ORAL | 0 refills | Status: DC

## 2017-07-24 MED ORDER — MEDROXYPROGESTERONE ACETATE 150 MG/ML IM SUSP: 150.0000 mg | INTRAMUSCULAR | 3 refills | Status: DC

## 2017-07-24 NOTE — Discharge Summary (Signed)
 @DJELOGO @  Physician Obstetric Discharge Summary  Patient ID: Pamela [name] MRN: [id number] DOB/AGE: [birth year] 31 y.o.   Date of Admission: 07/22/2017  Date of Discharge: 07/24/17  Admitting Diagnosis: Scheduled cesarean section at 2660w0d  Mode of Delivery: repeat cesarean section       low uterine, transverse     Discharge Diagnosis: No other diagnosis and Reasons for cesarean section  Elective repeat   Intrapartum Procedures: N/A   Post partum procedures: n/a  Complications: none                        Discharge Day SOAP Note:  Subjective:  The patient has no complaints.  She is ambulating well. She is taking PO well. Pain is well controlled with current medications. Patient is urinating without difficulty.  She is passing flatus.    Objective  Vital signs in last 24 hours: BP 123/72 (BP Location: Right Arm)   Pulse 74   Temp 97.9 F (36.6 C) (Oral)   Resp 18   Ht 5\' 7"  (1.702 m)   Wt 176 lb (79.8 kg)   LMP 10/08/2016 (Exact Date)   SpO2 100%   Breastfeeding? Unknown   BMI 27.57 kg/m   Physical Exam: Gen: NAD Abdomen: Soft, passing gas, bowel sounds present clean, dry, no drainage Fundus Fundal Tone: Firm  Lochia Amount: Small     Data Review Labs: CBC Latest Ref Rng & Units 07/21/2017 04/24/2017 11/24/2016  WBC 3.6 - 11.0 K/uL 10.0 10.9(H) 10.0  Hemoglobin 12.0 - 16.0 g/dL 40.912.8 81.111.8 91.412.6  Hematocrit 35.0 - 47.0 % 39.5 36.0 39.3  Platelets 150 - 440 K/uL 187 224 255   A POS  Assessment:  Active Problems:   Delivery by cesarean section using transverse incision of lower segment of uterus   Doing well.  Normal progress as expected.    Plan:  Discharge to home  Modified rest as directed - may slowly resume normal activities with restrictions  as discussed.  Medications as written.  See below for additional.       Discharge Instructions: Per After Visit Summary. Activity: Advance as tolerated. Pelvic rest for 6 weeks.  Also refer to After  Visit Summary.  Wound care discussed. Diet: Regular Medications: Allergies as of 07/24/2017      Reactions   Tramadol Itching      Medication List    TAKE these medications   ibuprofen 600 MG tablet Commonly known as:  ADVIL,MOTRIN Take 1 tablet (600 mg total) by mouth every 6 (six) hours.   medroxyPROGESTERone 150 MG/ML injection Commonly known as:  DEPO-PROVERA Inject 1 mL (150 mg total) into the muscle every 3 (three) months.   nitrofurantoin (macrocrystal-monohydrate) 100 MG capsule Commonly known as:  MACROBID Take 1 capsule (100 mg total) 2 (two) times daily by mouth.   oxyCODONE 5 MG immediate release tablet Commonly known as:  Oxy IR/ROXICODONE Take 1 tablet (5 mg total) by mouth every 6 (six) hours as needed for moderate pain or severe pain.   senna-docusate 8.6-50 MG tablet Commonly known as:  Senokot-S Take 2 tablets by mouth daily. Start taking on:  07/25/2017   simethicone 80 MG chewable tablet Commonly known as:  MYLICON Chew 1 tablet (80 mg total) by mouth 4 (four) times daily.      Outpatient follow up:  Postpartum contraception: Depo provera injection. Pt declines to have it at discharge. She will bring into office in a few weeks.  Discharged Condition: good  Discharged to: home  Newborn Data: Disposition:home with mother  Apgars: APGAR (1 MIN): 9   APGAR (5 MINS): 9   APGAR (10 MINS):    Baby Feeding: Bottle and Breast  Doreene Burkennie Nekia Maxham, CNM  07/24/2017 6:26 AM

## 2017-07-24 NOTE — Final Progress Note (Signed)
Discharge Day SOAP Note:  Subjective:  The patient has no complaints.  She is ambulating well. She is taking PO well. Pain is well controlled with current medications. Patient is urinating without difficulty.  She is passing flatus.    Objective  Vital signs in last 24 hours: BP 123/72 (BP Location: Right Arm)   Pulse 74   Temp 97.9 F (36.6 C) (Oral)   Resp 18   Ht 5\' 7"  (1.702 m)   Wt 176 lb (79.8 kg)   LMP 10/08/2016 (Exact Date)   SpO2 100%   Breastfeeding? Unknown   BMI 27.57 kg/m   Physical Exam: Gen: NAD Abdomen: Soft, passing gas, bowel sounds present clean, dry, no drainage Fundus Fundal Tone: Firm  Lochia Amount: Small     Data Review Labs: CBC Latest Ref Rng & Units 07/21/2017 04/24/2017 11/24/2016  WBC 3.6 - 11.0 K/uL 10.0 10.9(H) 10.0  Hemoglobin 12.0 - 16.0 g/dL 78.212.8 95.611.8 21.312.6  Hematocrit 35.0 - 47.0 % 39.5 36.0 39.3  Platelets 150 - 440 K/uL 187 224 255   A POS  Assessment:  Active Problems:   Delivery by cesarean section using transverse incision of lower segment of uterus   Doing well.  Normal progress as expected.    Plan:  Discharge to home  Modified rest as directed - may slowly resume normal activities with restrictions  as discussed.  Medications as written.  See below for additional.       Discharge Instructions: Per After Visit Summary. Activity: Advance as tolerated. Pelvic rest for 6 weeks.  Also refer to After Visit Summary.  Wound care discussed. Diet: Regular Medications: Allergies as of 07/24/2017      Reactions   Tramadol Itching      Medication List    TAKE these medications   ibuprofen 600 MG tablet Commonly known as:  ADVIL,MOTRIN Take 1 tablet (600 mg total) by mouth every 6 (six) hours.   medroxyPROGESTERone 150 MG/ML injection Commonly known as:  DEPO-PROVERA Inject 1 mL (150 mg total) into the muscle every 3 (three) months.   nitrofurantoin (macrocrystal-monohydrate) 100 MG capsule Commonly known as:   MACROBID Take 1 capsule (100 mg total) 2 (two) times daily by mouth.   oxyCODONE 5 MG immediate release tablet Commonly known as:  Oxy IR/ROXICODONE Take 1 tablet (5 mg total) by mouth every 6 (six) hours as needed for moderate pain or severe pain.   senna-docusate 8.6-50 MG tablet Commonly known as:  Senokot-S Take 2 tablets by mouth daily. Start taking on:  07/25/2017   simethicone 80 MG chewable tablet Commonly known as:  MYLICON Chew 1 tablet (80 mg total) by mouth 4 (four) times daily.      Outpatient follow up:  Postpartum contraception: Depo provera injection. Pt declines to have it at discharge. She will bring into office in a few weeks.   Discharged Condition: good  Discharged to: home  Newborn Data: Disposition:home with mother  Apgars: APGAR (1 MIN): 9   APGAR (5 MINS): 9   APGAR (10 MINS):    Baby Feeding: Bottle and Breast  Doreene Burkennie Manuela Halbur, CNM  07/24/2017

## 2017-07-24 NOTE — Progress Notes (Signed)
Pt discharged with infant.  Discharge instructions, prescriptions and follow up appointment given to and reviewed with pt. Pt verbalized understanding. Escorted out by staff. 

## 2017-07-27 ENCOUNTER — Encounter: Payer: Medicaid Other | Admitting: Certified Nurse Midwife

## 2017-07-27 ENCOUNTER — Other Ambulatory Visit: Payer: Medicaid Other

## 2017-07-29 ENCOUNTER — Encounter: Payer: Self-pay | Admitting: Obstetrics and Gynecology

## 2017-07-29 ENCOUNTER — Ambulatory Visit (INDEPENDENT_AMBULATORY_CARE_PROVIDER_SITE_OTHER): Payer: Medicaid Other | Admitting: Obstetrics and Gynecology

## 2017-07-29 VITALS — BP 134/79 | HR 77 | Ht 67.0 in | Wt 163.1 lb

## 2017-07-29 DIAGNOSIS — Z3042 Encounter for surveillance of injectable contraceptive: Secondary | ICD-10-CM | POA: Diagnosis not present

## 2017-07-29 DIAGNOSIS — Z3009 Encounter for other general counseling and advice on contraception: Secondary | ICD-10-CM

## 2017-07-29 DIAGNOSIS — Z9889 Other specified postprocedural states: Secondary | ICD-10-CM

## 2017-07-29 MED ORDER — MEDROXYPROGESTERONE ACETATE 150 MG/ML IM SUSP
150.0000 mg | Freq: Once | INTRAMUSCULAR | Status: AC
  Administered 2017-07-29: 150 mg via INTRAMUSCULAR

## 2017-07-29 NOTE — Progress Notes (Signed)
Abbreviated note - Dragon not working:  HPI:      [name] is a 31 y.o. [phone] who LMP was No LMP recorded.  Subjective:   She presents today one week after CD.  No complaints. Desires depo.    Hx: The following portions of the patient's history were reviewed and updated as appropriate:             She  has a past medical history of Headache, History of recurrent UTIs, Panic attacks, Positive urine pregnancy test, and Umbilical hernia. She does not have any pertinent problems on file. She  has a past surgical history that includes Cesarean section (12/01/2008); Dilation and evacuation (N/A, 08/07/2015); and Cesarean section (N/A, 07/22/2017). Her family history includes Hypertension in her father. She  reports that  has never smoked. she has never used smokeless tobacco. She reports that she drinks alcohol. She reports that she does not use drugs. She is allergic to tramadol.       Review of Systems:  Review of Systems  Constitutional: Denied constitutional symptoms, night sweats, recent illness, fatigue, fever, insomnia and weight loss.  Eyes: Denied eye symptoms, eye pain, photophobia, vision change and visual disturbance.  Ears/Nose/Throat/Neck: Denied ear, nose, throat or neck symptoms, hearing loss, nasal discharge, sinus congestion and sore throat.  Cardiovascular: Denied cardiovascular symptoms, arrhythmia, chest pain/pressure, edema, exercise intolerance, orthopnea and palpitations.  Respiratory: Denied pulmonary symptoms, asthma, pleuritic pain, productive sputum, cough, dyspnea and wheezing.  Gastrointestinal: Denied, gastro-esophageal reflux, melena, nausea and vomiting.  Genitourinary: Denied genitourinary symptoms including symptomatic vaginal discharge, pelvic relaxation issues, and urinary complaints.  Musculoskeletal: Denied musculoskeletal symptoms, stiffness, swelling, muscle weakness and myalgia.  Dermatologic: Denied dermatology symptoms, rash and scar.   Neurologic: Denied neurology symptoms, dizziness, headache, neck pain and syncope.  Psychiatric: Denied psychiatric symptoms, anxiety and depression.  Endocrine: Denied endocrine symptoms including hot flashes and night sweats.   Meds:   Current Outpatient Medications on File Prior to Visit  Medication Sig Dispense Refill  . medroxyPROGESTERone (DEPO-PROVERA) 150 MG/ML injection Inject 1 mL (150 mg total) into the muscle every 3 (three) months. 1 mL 3  . nitrofurantoin, macrocrystal-monohydrate, (MACROBID) 100 MG capsule Take 1 capsule (100 mg total) 2 (two) times daily by mouth. 14 capsule 0  . oxyCODONE (OXY IR/ROXICODONE) 5 MG immediate release tablet Take 1 tablet (5 mg total) by mouth every 6 (six) hours as needed for moderate pain or severe pain. 30 tablet 0  . senna-docusate (SENOKOT-S) 8.6-50 MG tablet Take 2 tablets by mouth daily. 30 tablet 0  . simethicone (MYLICON) 80 MG chewable tablet Chew 1 tablet (80 mg total) by mouth 4 (four) times daily. 30 tablet 0   No current facility-administered medications on file prior to visit.     Objective:     Vitals:   07/29/17 1338  BP: 134/79  Pulse: 77               Abdomen: Soft.  Non-tender.  No masses.  No HSM.  Incision/s: Intact.  Healing well.  No erythema.  No drainage.     Assessment:    [id number] Patient Active Problem List   Diagnosis Date Noted  . Delivery by cesarean section using transverse incision of lower segment of uterus 07/22/2017  . Alteration in comfort associated with uterine contractions 07/14/2017  . Indication for care in labor or delivery 07/14/2017  . GBS (group B streptococcus) UTI complicating pregnancy 06/09/2017  . Vaginal bleeding during pregnancy,  antepartum 04/01/2017  . Umbilical hernia 01/28/2017  . History of cesarean delivery, currently pregnant 01/28/2017     1. Post-operative state   2. Birth control counseling   3. Encounter for Depo-Provera contraception     Well post -  op   Plan:            1.  May resume normal act. With exception of heavy lifting and intercourse.  2. Depo given Orders No orders of the defined types were placed in this encounter.   No orders of the defined types were placed in this encounter.     F/U  Return in about 4 weeks (around 08/26/2017). I spent 16 minutes with this patient of which greater than 50% was spent discussing see above note.  Elonda Huskyavid J. Jaloni Davoli, M.D. 07/29/2017 2:45 PM

## 2017-07-29 NOTE — Addendum Note (Signed)
Addended by: Brooke DareSICK, Tahtiana Rozier L on: 07/29/2017 03:54 PM   Modules accepted: Orders

## 2017-09-03 ENCOUNTER — Encounter: Payer: Self-pay | Admitting: Certified Nurse Midwife

## 2017-09-03 ENCOUNTER — Ambulatory Visit (INDEPENDENT_AMBULATORY_CARE_PROVIDER_SITE_OTHER): Payer: Medicaid Other | Admitting: Certified Nurse Midwife

## 2017-09-03 NOTE — Patient Instructions (Signed)
Preventive Care 18-39 Years, Female Preventive care refers to lifestyle choices and visits with your health care provider that can promote health and wellness. What does preventive care include?  A yearly physical exam. This is also called an annual well check.  Dental exams once or twice a year.  Routine eye exams. Ask your health care provider how often you should have your eyes checked.  Personal lifestyle choices, including: ? Daily care of your teeth and gums. ? Regular physical activity. ? Eating a healthy diet. ? Avoiding tobacco and drug use. ? Limiting alcohol use. ? Practicing safe sex. ? Taking vitamin and mineral supplements as recommended by your health care provider. What happens during an annual well check? The services and screenings done by your health care provider during your annual well check will depend on your age, overall health, lifestyle risk factors, and family history of disease. Counseling Your health care provider may ask you questions about your:  Alcohol use.  Tobacco use.  Drug use.  Emotional well-being.  Home and relationship well-being.  Sexual activity.  Eating habits.  Work and work Statistician.  Method of birth control.  Menstrual cycle.  Pregnancy history.  Screening You may have the following tests or measurements:  Height, weight, and BMI.  Diabetes screening. This is done by checking your blood sugar (glucose) after you have not eaten for a while (fasting).  Blood pressure.  Lipid and cholesterol levels. These may be checked every 5 years starting at age 66.  Skin check.  Hepatitis C blood test.  Hepatitis B blood test.  Sexually transmitted disease (STD) testing.  BRCA-related cancer screening. This may be done if you have a family history of breast, ovarian, tubal, or peritoneal cancers.  Pelvic exam and Pap test. This may be done every 3 years starting at age 40. Starting at age 59, this may be done every 5  years if you have a Pap test in combination with an HPV test.  Discuss your test results, treatment options, and if necessary, the need for more tests with your health care provider. Vaccines Your health care provider may recommend certain vaccines, such as:  Influenza vaccine. This is recommended every year.  Tetanus, diphtheria, and acellular pertussis (Tdap, Td) vaccine. You may need a Td booster every 10 years.  Varicella vaccine. You may need this if you have not been vaccinated.  HPV vaccine. If you are 69 or younger, you may need three doses over 6 months.  Measles, mumps, and rubella (MMR) vaccine. You may need at least one dose of MMR. You may also need a second dose.  Pneumococcal 13-valent conjugate (PCV13) vaccine. You may need this if you have certain conditions and were not previously vaccinated.  Pneumococcal polysaccharide (PPSV23) vaccine. You may need one or two doses if you smoke cigarettes or if you have certain conditions.  Meningococcal vaccine. One dose is recommended if you are age 27-21 years and a first-year college student living in a residence hall, or if you have one of several medical conditions. You may also need additional booster doses.  Hepatitis A vaccine. You may need this if you have certain conditions or if you travel or work in places where you may be exposed to hepatitis A.  Hepatitis B vaccine. You may need this if you have certain conditions or if you travel or work in places where you may be exposed to hepatitis B.  Haemophilus influenzae type b (Hib) vaccine. You may need this if  you have certain risk factors.  Talk to your health care provider about which screenings and vaccines you need and how often you need them. This information is not intended to replace advice given to you by your health care provider. Make sure you discuss any questions you have with your health care provider. Document Released: 10/14/2001 Document Revised: 05/07/2016  Document Reviewed: 06/19/2015 Elsevier Interactive Patient Education  [employer].

## 2017-09-03 NOTE — Progress Notes (Signed)
6 week postpartum visit- Patient feels well today with no complaints.

## 2017-09-03 NOTE — Progress Notes (Signed)
Subjective:    Pamela [name] is a 32 y.o. [id number] African American female who presents for a postpartum visit. She is 6 weeks postpartum following a repeat cesarean section, low transverse incision at 41 gestational weeks. Anesthesia: spinal. I have fully reviewed the prenatal and intrapartum course.   Postpartum course has been uncomplicated. Baby's course has been uncomplicated. Baby is feeding by formula. Bleeding no bleeding. Bowel function is normal. Bladder function is normal.   Patient is sexually active. Last sexual activity: a few days ago. Contraception method is Depo-Provera injections. Postpartum depression screening: negative. Score 1.  Last pap 12/29/2016 and was negative.  The following portions of the patient's history were reviewed and updated as appropriate: allergies, current medications, past medical history, past surgical history and problem list.  Review of Systems  Pertinent items are noted in HPI.   Objective:   BP 131/87   Pulse 80   Wt 153 lb (69.4 kg)   BMI 23.96 kg/m   General:  alert, cooperative and no distress   Breasts:  deferred, no complaints  Lungs: clear to auscultation bilaterally  Heart:  regular rate and rhythm  Abdomen: soft, nontender      Depression screen PHQ 2/9 09/03/2017  Decreased Interest 0  Down, Depressed, Hopeless 0  PHQ - 2 Score 0  Altered sleeping 0  Tired, decreased energy 1  Change in appetite 0  Feeling bad or failure about yourself  0  Trouble concentrating 0  Moving slowly or fidgety/restless 0  Suicidal thoughts 0  PHQ-9 Score 1      Assessment:   Postpartum exam Six (6) wks s/p repeat c-section Formula feeding Depression screening Contraception counseling   Plan:   May return to work without restrictions.   Reviewed red flag symptoms and when to call.  Follow up in: 7 weeks for Depo injection or earlier if needed.   Pamela [name], CNM [facility], [facility]

## 2017-09-04 ENCOUNTER — Encounter: Payer: Self-pay | Admitting: Certified Nurse Midwife

## 2017-10-23 ENCOUNTER — Ambulatory Visit (INDEPENDENT_AMBULATORY_CARE_PROVIDER_SITE_OTHER): Payer: Medicaid Other | Admitting: Certified Nurse Midwife

## 2017-10-23 ENCOUNTER — Encounter: Payer: Self-pay | Admitting: Certified Nurse Midwife

## 2017-10-23 VITALS — BP 116/72 | HR 67 | Ht 67.0 in | Wt 157.9 lb

## 2017-10-23 DIAGNOSIS — Z3042 Encounter for surveillance of injectable contraceptive: Secondary | ICD-10-CM | POA: Diagnosis not present

## 2017-10-23 MED ORDER — MEDROXYPROGESTERONE ACETATE 150 MG/ML IM SUSP
150.0000 mg | Freq: Once | INTRAMUSCULAR | Status: AC
  Administered 2017-10-23: 150 mg via INTRAMUSCULAR

## 2017-10-23 NOTE — Progress Notes (Signed)
I have reviewed the record and concur with patient management and plan.    Kayman Snuffer Michelle Zeena Starkel, CNM [facility], [facility] 

## 2017-10-23 NOTE — Progress Notes (Signed)
Date last pap: n/a Last Depo-Provera: 07/29/2017. Side Effects if any:n/a- notes spotting when time for next inj. Encouraged pt to get depo at 10/11 weeks.  Serum HCG indicated? N/a. Depo-Provera 150 mg IM given by: CM. Next appointment due  5/10--------5/24  AE due  Between 12-2017 and 03-2018. (4-6 months PP)

## 2017-10-29 ENCOUNTER — Encounter: Payer: Medicaid Other | Admitting: Obstetrics and Gynecology

## 2018-01-08 ENCOUNTER — Encounter: Payer: Medicaid Other | Admitting: Certified Nurse Midwife

## 2018-01-29 ENCOUNTER — Encounter: Payer: Self-pay | Admitting: Certified Nurse Midwife

## 2018-01-29 ENCOUNTER — Ambulatory Visit (INDEPENDENT_AMBULATORY_CARE_PROVIDER_SITE_OTHER): Payer: Medicaid Other | Admitting: Certified Nurse Midwife

## 2018-01-29 VITALS — BP 120/66 | HR 64 | Ht 68.0 in | Wt 159.4 lb

## 2018-01-29 DIAGNOSIS — Z01419 Encounter for gynecological examination (general) (routine) without abnormal findings: Secondary | ICD-10-CM

## 2018-01-29 DIAGNOSIS — Z3042 Encounter for surveillance of injectable contraceptive: Secondary | ICD-10-CM

## 2018-01-29 DIAGNOSIS — N6311 Unspecified lump in the right breast, upper outer quadrant: Secondary | ICD-10-CM | POA: Diagnosis not present

## 2018-01-29 MED ORDER — MEDROXYPROGESTERONE ACETATE 150 MG/ML IM SUSP: 150.0000 mg | INTRAMUSCULAR | 3 refills | Status: DC

## 2018-01-29 MED ORDER — MEDROXYPROGESTERONE ACETATE 150 MG/ML IM SUSP
150.0000 mg | Freq: Once | INTRAMUSCULAR | Status: AC
  Administered 2018-01-29: 150 mg via INTRAMUSCULAR

## 2018-01-29 NOTE — Patient Instructions (Addendum)
Preventive Care 18-39 Years, Female Preventive care refers to lifestyle choices and visits with your health care provider that can promote health and wellness. What does preventive care include?  A yearly physical exam. This is also called an annual well check.  Dental exams once or twice a year.  Routine eye exams. Ask your health care provider how often you should have your eyes checked.  Personal lifestyle choices, including: ? Daily care of your teeth and gums. ? Regular physical activity. ? Eating a healthy diet. ? Avoiding tobacco and drug use. ? Limiting alcohol use. ? Practicing safe sex. ? Taking vitamin and mineral supplements as recommended by your health care provider. What happens during an annual well check? The services and screenings done by your health care provider during your annual well check will depend on your age, overall health, lifestyle risk factors, and family history of disease. Counseling Your health care provider may ask you questions about your:  Alcohol use.  Tobacco use.  Drug use.  Emotional well-being.  Home and relationship well-being.  Sexual activity.  Eating habits.  Work and work Statistician.  Method of birth control.  Menstrual cycle.  Pregnancy history.  Screening You may have the following tests or measurements:  Height, weight, and BMI.  Diabetes screening. This is done by checking your blood sugar (glucose) after you have not eaten for a while (fasting).  Blood pressure.  Lipid and cholesterol levels. These may be checked every 5 years starting at age 66.  Skin check.  Hepatitis C blood test.  Hepatitis B blood test.  Sexually transmitted disease (STD) testing.  BRCA-related cancer screening. This may be done if you have a family history of breast, ovarian, tubal, or peritoneal cancers.  Pelvic exam and Pap test. This may be done every 3 years starting at age 40. Starting at age 59, this may be done every 5  years if you have a Pap test in combination with an HPV test.  Discuss your test results, treatment options, and if necessary, the need for more tests with your health care provider. Vaccines Your health care provider may recommend certain vaccines, such as:  Influenza vaccine. This is recommended every year.  Tetanus, diphtheria, and acellular pertussis (Tdap, Td) vaccine. You may need a Td booster every 10 years.  Varicella vaccine. You may need this if you have not been vaccinated.  HPV vaccine. If you are 69 or younger, you may need three doses over 6 months.  Measles, mumps, and rubella (MMR) vaccine. You may need at least one dose of MMR. You may also need a second dose.  Pneumococcal 13-valent conjugate (PCV13) vaccine. You may need this if you have certain conditions and were not previously vaccinated.  Pneumococcal polysaccharide (PPSV23) vaccine. You may need one or two doses if you smoke cigarettes or if you have certain conditions.  Meningococcal vaccine. One dose is recommended if you are age 27-21 years and a first-year college student living in a residence hall, or if you have one of several medical conditions. You may also need additional booster doses.  Hepatitis A vaccine. You may need this if you have certain conditions or if you travel or work in places where you may be exposed to hepatitis A.  Hepatitis B vaccine. You may need this if you have certain conditions or if you travel or work in places where you may be exposed to hepatitis B.  Haemophilus influenzae type b (Hib) vaccine. You may need this if  you have certain risk factors.  Talk to your health care provider about which screenings and vaccines you need and how often you need them. This information is not intended to replace advice given to you by your health care provider. Make sure you discuss any questions you have with your health care provider. Document Released: 10/14/2001 Document Revised: 05/07/2016  Document Reviewed: 06/19/2015 Elsevier Interactive Patient Education  2018 [employer].  Fibrocystic Breast Changes Fibrocystic breast changes are changes that can make your breasts swollen or painful. These changes happen when tiny sacs of fluid (cysts) form in the breast. This is a common condition. It does not mean that you have cancer. It usually happens because of hormone changes during a monthly period. Follow these instructions at home:  Check your breasts after every monthly period. If you do not have monthly periods, check your breasts on the first day of every month. Check for: ? Soreness. ? New swelling or puffiness. ? A change in breast size. ? A change in a lump that was already there.  Take over-the-counter and prescription medicines only as told by your doctor.  Wear a support or sports bra that fits well. Wear this support especially when you are exercising.  Avoid or have less caffeine, fat, and sugar in what you eat and drink as told by your doctor. Contact a doctor if:  You have fluid coming from your nipple, especially if the fluid has blood in it.  You have new lumps or bumps in your breast.  Your breast gets puffy, red, and painful.  You have changes in how your breast looks.  Your nipple looks flat or it sinks into your breast. Get help right away if:  Your breast turns red, and the redness is spreading. Summary  Fibrocystic breast changes are changes that can make your breasts swollen or painful.  This condition can happen when you have hormone changes during your monthly period.  With this condition, it is important to check your breasts after every monthly period. If you do not have monthly periods, check your breasts on the first day of every month. This information is not intended to replace advice given to you by your health care provider. Make sure you discuss any questions you have with your health care provider. Document Released: 07/31/2008  Document Revised: 05/01/2016 Document Reviewed: 05/01/2016 Elsevier Interactive Patient Education  2017 [employer].

## 2018-02-01 NOTE — Progress Notes (Signed)
ANNUAL PREVENTATIVE CARE GYN  ENCOUNTER NOTE  Subjective:       Pamela [name] is a 32 y.o. [id number] female here for a routine annual gynecologic exam.  Current complaints: 1.  Right breast lump  Denies difficulty breathing or respiratory distress, chest pain, abdominal pain, vaginal bleeding, dysuria, and leg pain or swelling.     Gynecologic History  No LMP recorded. Patient has had an injection.  Contraception: Depo-Provera injections  Last Pap: 11/2016. Results were: normal  Obstetric History  OB History  Gravida Para Term Preterm AB Living  4 2 2   2 2   SAB TAB Ectopic Multiple Live Births  1 1   0 2    # Outcome Date GA Lbr Len/2nd Weight Sex Delivery Anes PTL Lv  4 Term 07/22/17 3730w0d  9 lb 11.2 oz (4.4 kg) F CS-LTranv Spinal  LIV  3 SAB 08/07/15 6637w1d       ND  2 Term 12/01/08 3074w0d  8 lb 11 oz (3.941 kg) F CS-LTranv EPI  LIV  1 TAB 2008        ND    Obstetric Comments  2016 [facility]    Past Medical History:  Diagnosis Date  . Headache   . History of recurrent UTIs   . Panic attacks   . Umbilical hernia     Past Surgical History:  Procedure Laterality Date  . CESAREAN SECTION  12/01/2008  . CESAREAN SECTION N/A 07/22/2017   Procedure: REPEAT CESAREAN SECTION (Female, 0948, 9lbs 11oz);  Surgeon: Linzie CollinEvans, David James, MD;  Location: [facility] ORS;  Service: Obstetrics;  Laterality: N/A;  . DILATION AND EVACUATION N/A 08/07/2015   Procedure: DILATATION AND EVACUATION;  Surgeon: Allie BossierMyra C Dove, MD;  Location: [facility] ORS;  Service: Gynecology;  Laterality: N/A;    No current outpatient medications on file prior to visit.   No current facility-administered medications on file prior to visit.     Allergies  Allergen Reactions  . Tramadol Itching    Social History   Socioeconomic History  . Marital status: Single    Spouse name: Not on file  . Number of children: Not on file  . Years of education: Not on file  . Highest education level: Not on file  Occupational  History  . Not on file  Social Needs  . Financial resource strain: Not on file  . Food insecurity:    Worry: Not on file    Inability: Not on file  . Transportation needs:    Medical: Not on file    Non-medical: Not on file  Tobacco Use  . Smoking status: Never Smoker  . Smokeless tobacco: Never Used  Substance and Sexual Activity  . Alcohol use: Yes    Comment: None since pregnancy  . Drug use: No  . Sexual activity: Yes    Birth control/protection: Injection  Lifestyle  . Physical activity:    Days per week: Not on file    Minutes per session: Not on file  . Stress: Not on file  Relationships  . Social connections:    Talks on phone: Not on file    Gets together: Not on file    Attends religious service: Not on file    Active member of club or organization: Not on file    Attends meetings of clubs or organizations: Not on file    Relationship status: Not on file  . Intimate partner violence:    Fear of current or ex partner:  Not on file    Emotionally abused: Not on file    Physically abused: Not on file    Forced sexual activity: Not on file  Other Topics Concern  . Not on file  Social History Narrative  . Not on file    Family History  Problem Relation Age of Onset  . Hypertension Father   . Alcohol abuse Neg Hx   . Arthritis Neg Hx   . Asthma Neg Hx   . Birth defects Neg Hx   . Cancer Neg Hx   . COPD Neg Hx   . Depression Neg Hx   . Diabetes Neg Hx   . Drug abuse Neg Hx   . Early death Neg Hx   . Hearing loss Neg Hx   . Heart disease Neg Hx   . Hyperlipidemia Neg Hx   . Kidney disease Neg Hx   . Learning disabilities Neg Hx   . Mental illness Neg Hx   . Mental retardation Neg Hx   . Miscarriages / Stillbirths Neg Hx   . Stroke Neg Hx   . Vision loss Neg Hx   . Varicose Veins Neg Hx     The following portions of the patient's history were reviewed and updated as appropriate: allergies, current medications, past family history, past medical  history, past social history, past surgical history and problem list.  Review of Systems  ROS negative except as noted above. Information obtained from patient.    Objective:   BP 120/66   Pulse 64   Ht 5\' 8"  (1.727 m)   Wt 159 lb 6.4 oz (72.3 kg)   BMI 24.24 kg/m   CONSTITUTIONAL: Well-developed, well-nourished female in no acute distress.   PSYCHIATRIC: Normal mood and affect. Normal behavior. Normal judgment and thought content.  NEUROLGIC: Alert and oriented to person, place, and time. Normal muscle tone coordination. No cranial nerve deficit noted.  HENT:  Normocephalic, atraumatic, External right and left ear normal.   EYES: Conjunctivae and EOM are normal. Pupils are equal and round.    NECK: Normal range of motion, supple, no masses.  Normal thyroid.   SKIN: Skin is warm and dry. No rash noted. Not diaphoretic. No erythema. No pallor.  CARDIOVASCULAR: Normal heart rate noted, regular rhythm, no murmur.  RESPIRATORY: Clear to auscultation bilaterally. Effort and breath sounds normal, no problems with respiration noted.  BREASTS: Symmetric in size. Left breast: No masses, skin changes, nipple drainage, or lymphadenopathy. Right breast single, round, mobile pea sized lump located at 10 o'clock.  ABDOMEN: Soft, normal bowel sounds, no distention noted.  No tenderness, rebound or guarding.   PELVIC:  External Genitalia: Normal  Vagina: Normal  Cervix: Normal  Uterus: Normal  Adnexa: Normal  MUSCULOSKELETAL: Normal range of motion. No tenderness.  No cyanosis, clubbing, or edema.  2+ distal pulses.  LYMPHATIC: No Axillary, Supraclavicular, or Inguinal Adenopathy.  Assessment:   Annual gynecologic examination 32 y.o.   Contraception: Depo-Provera injections   Normal BMI   Problem List Items Addressed This Visit    None    Visit Diagnoses    Well woman exam    -  Primary   Encounter for Depo-Provera contraception       Relevant Orders   POCT urine  pregnancy   Breast lump on right side at 10 o'clock position          Plan:   Pap: Not needed.  Labs: Declined.  Routine preventative health maintenance measures emphasized: Exercise/Diet/Weight  control, Tobacco Warnings, Alcohol/Substance use risks and Stress Management; see AVS.   Rx: Depo, see orders.   Reviewed red flag symptoms and when to call.   RTC x 3-6 months for follow up breast exam.   RTC x 1 year for Annual Exam or sooner if needed.    Gunnar Bulla, CNM [facility], [facility]

## 2018-05-07 ENCOUNTER — Ambulatory Visit (INDEPENDENT_AMBULATORY_CARE_PROVIDER_SITE_OTHER): Payer: Self-pay | Admitting: Certified Nurse Midwife

## 2018-05-07 ENCOUNTER — Encounter: Payer: Self-pay | Admitting: Certified Nurse Midwife

## 2018-05-07 VITALS — BP 124/72 | HR 63 | Ht 68.0 in | Wt 154.5 lb

## 2018-05-07 DIAGNOSIS — Z1231 Encounter for screening mammogram for malignant neoplasm of breast: Secondary | ICD-10-CM

## 2018-05-07 DIAGNOSIS — Z3042 Encounter for surveillance of injectable contraceptive: Secondary | ICD-10-CM

## 2018-05-07 DIAGNOSIS — Z3202 Encounter for pregnancy test, result negative: Secondary | ICD-10-CM

## 2018-05-07 DIAGNOSIS — Z1239 Encounter for other screening for malignant neoplasm of breast: Secondary | ICD-10-CM

## 2018-05-07 LAB — POCT URINE PREGNANCY: Preg Test, Ur: NEGATIVE

## 2018-05-07 MED ORDER — MEDROXYPROGESTERONE ACETATE 150 MG/ML IM SUSP
150.0000 mg | Freq: Once | INTRAMUSCULAR | Status: AC
  Administered 2018-05-07: 150 mg via INTRAMUSCULAR

## 2018-05-07 NOTE — Patient Instructions (Signed)
Medroxyprogesterone injection [Contraceptive] What is this medicine? MEDROXYPROGESTERONE (me DROX ee proe JES te rone) contraceptive injections prevent pregnancy. They provide effective birth control for 3 months. Depo-subQ Provera 104 is also used for treating pain related to endometriosis. This medicine may be used for other purposes; ask your health care provider or pharmacist if you have questions. COMMON BRAND NAME(S): Depo-Provera, Depo-subQ Provera 104 What should I tell my health care provider before I take this medicine? They need to know if you have any of these conditions: -frequently drink alcohol -asthma -blood vessel disease or a history of a blood clot in the lungs or legs -bone disease such as osteoporosis -breast cancer -diabetes -eating disorder (anorexia nervosa or bulimia) -high blood pressure -HIV infection or AIDS -kidney disease -liver disease -mental depression -migraine -seizures (convulsions) -stroke -tobacco smoker -vaginal bleeding -an unusual or allergic reaction to medroxyprogesterone, other hormones, medicines, foods, dyes, or preservatives -pregnant or trying to get pregnant -breast-feeding How should I use this medicine? Depo-Provera Contraceptive injection is given into a muscle. Depo-subQ Provera 104 injection is given under the skin. These injections are given by a health care professional. You must not be pregnant before getting an injection. The injection is usually given during the first 5 days after the start of a menstrual period or 6 weeks after delivery of a baby. Talk to your pediatrician regarding the use of this medicine in children. Special care may be needed. These injections have been used in female children who have started having menstrual periods. Overdosage: If you think you have taken too much of this medicine contact a poison control center or emergency room at once. NOTE: This medicine is only for you. Do not share this medicine  with others. What if I miss a dose? Try not to miss a dose. You must get an injection once every 3 months to maintain birth control. If you cannot keep an appointment, call and reschedule it. If you wait longer than 13 weeks between Depo-Provera contraceptive injections or longer than 14 weeks between Depo-subQ Provera 104 injections, you could get pregnant. Use another method for birth control if you miss your appointment. You may also need a pregnancy test before receiving another injection. What may interact with this medicine? Do not take this medicine with any of the following medications: -bosentan This medicine may also interact with the following medications: -aminoglutethimide -antibiotics or medicines for infections, especially rifampin, rifabutin, rifapentine, and griseofulvin -aprepitant -barbiturate medicines such as phenobarbital or primidone -bexarotene -carbamazepine -medicines for seizures like ethotoin, felbamate, oxcarbazepine, phenytoin, topiramate -modafinil -St. John's wort This list may not describe all possible interactions. Give your health care provider a list of all the medicines, herbs, non-prescription drugs, or dietary supplements you use. Also tell them if you smoke, drink alcohol, or use illegal drugs. Some items may interact with your medicine. What should I watch for while using this medicine? This drug does not protect you against HIV infection (AIDS) or other sexually transmitted diseases. Use of this product may cause you to lose calcium from your bones. Loss of calcium may cause weak bones (osteoporosis). Only use this product for more than 2 years if other forms of birth control are not right for you. The longer you use this product for birth control the more likely you will be at risk for weak bones. Ask your health care professional how you can keep strong bones. You may have a change in bleeding pattern or irregular periods. Many females stop having    periods while taking this drug. If you have received your injections on time, your chance of being pregnant is very low. If you think you may be pregnant, see your health care professional as soon as possible. Tell your health care professional if you want to get pregnant within the next year. The effect of this medicine may last a long time after you get your last injection. What side effects may I notice from receiving this medicine? Side effects that you should report to your doctor or health care professional as soon as possible: -allergic reactions like skin rash, itching or hives, swelling of the face, lips, or tongue -breast tenderness or discharge -breathing problems -changes in vision -depression -feeling faint or lightheaded, falls -fever -pain in the abdomen, chest, groin, or leg -problems with balance, talking, walking -unusually weak or tired -yellowing of the eyes or skin Side effects that usually do not require medical attention (report to your doctor or health care professional if they continue or are bothersome): -acne -fluid retention and swelling -headache -irregular periods, spotting, or absent periods -temporary pain, itching, or skin reaction at site where injected -weight gain This list may not describe all possible side effects. Call your doctor for medical advice about side effects. You may report side effects to FDA at 1-800-FDA-1088. Where should I keep my medicine? This does not apply. The injection will be given to you by a health care professional. NOTE: This sheet is a summary. It may not cover all possible information. If you have questions about this medicine, talk to your doctor, pharmacist, or health care provider.  2018 Elsevier/Gold Standard (2008-09-08 18:37:56) Fibrocystic Breast Changes Fibrocystic breast changes are changes that can make your breasts swollen or painful. These changes happen when tiny sacs of fluid (cysts) form in the breast. This  is a common condition. It does not mean that you have cancer. It usually happens because of hormone changes during a monthly period. Follow these instructions at home:  Check your breasts after every monthly period. If you do not have monthly periods, check your breasts on the first day of every month. Check for: ? Soreness. ? New swelling or puffiness. ? A change in breast size. ? A change in a lump that was already there.  Take over-the-counter and prescription medicines only as told by your doctor.  Wear a support or sports bra that fits well. Wear this support especially when you are exercising.  Avoid or have less caffeine, fat, and sugar in what you eat and drink as told by your doctor. Contact a doctor if:  You have fluid coming from your nipple, especially if the fluid has blood in it.  You have new lumps or bumps in your breast.  Your breast gets puffy, red, and painful.  You have changes in how your breast looks.  Your nipple looks flat or it sinks into your breast. Get help right away if:  Your breast turns red, and the redness is spreading. Summary  Fibrocystic breast changes are changes that can make your breasts swollen or painful.  This condition can happen when you have hormone changes during your monthly period.  With this condition, it is important to check your breasts after every monthly period. If you do not have monthly periods, check your breasts on the first day of every month. This information is not intended to replace advice given to you by your health care provider. Make sure you discuss any questions you have with your health  care provider. Document Released: 07/31/2008 Document Revised: 05/01/2016 Document Reviewed: 05/01/2016 Elsevier Interactive Patient Education  2017 [employer].

## 2018-05-10 NOTE — Progress Notes (Signed)
GYN ENCOUNTER NOTE  Subjective:       Pamela [name] is a 32 y.o. [id number] female here for breast exam and depo-provera injection.   LOV: 01/29/2018 Annual exam with right breast lump. Reports relief of lump with home treatment measures.   Denies difficulty breathing or respiratory distress, chest pain, abdominal pain, excessive vaginal bleeding, dysuria, and leg pain or swelling.    Gynecologic History  No LMP recorded. Patient has had an injection.   Contraception: Depo-Provera injections  Last Pap: 11/2016. Results were: Negative.  Obstetric History  OB History  Gravida Para Term Preterm AB Living  4 2 2   2 2   SAB TAB Ectopic Multiple Live Births  1 1   0 2    # Outcome Date GA Lbr Len/2nd Weight Sex Delivery Anes PTL Lv  4 Term 07/22/17 [redacted]w[redacted]d  9 lb 11.2 oz (4.4 kg) F CS-LTranv Spinal  LIV  3 SAB 08/07/15 [redacted]w[redacted]d       ND  2 Term 12/01/08 [redacted]w[redacted]d  8 lb 11 oz (3.941 kg) F CS-LTranv EPI  LIV  1 TAB 2008        ND    Obstetric Comments  2016 [facility])    Past Medical History:  Diagnosis Date  . Headache   . History of recurrent UTIs   . Panic attacks   . Umbilical hernia     Past Surgical History:  Procedure Laterality Date  . CESAREAN SECTION  12/01/2008  . CESAREAN SECTION N/A 07/22/2017   Procedure: REPEAT CESAREAN SECTION (Female, 0948, 9lbs 11oz);  Surgeon: Linzie Collin, MD;  Location: [facility] ORS;  Service: Obstetrics;  Laterality: N/A;  . DILATION AND EVACUATION N/A 08/07/2015   Procedure: DILATATION AND EVACUATION;  Surgeon: Allie Bossier, MD;  Location: [facility];  Service: Gynecology;  Laterality: N/A;    Current Outpatient Medications on File Prior to Visit  Medication Sig Dispense Refill  . medroxyPROGESTERone (DEPO-PROVERA) 150 MG/ML injection Inject 1 mL (150 mg total) into the muscle every 3 (three) months. 1 mL 3   No current facility-administered medications on file prior to visit.     Allergies  Allergen Reactions  . Tramadol Itching     Social History   Socioeconomic History  . Marital status: Single    Spouse name: Not on file  . Number of children: Not on file  . Years of education: Not on file  . Highest education level: Not on file  Occupational History  . Not on file  Social Needs  . Financial resource strain: Not on file  . Food insecurity:    Worry: Not on file    Inability: Not on file  . Transportation needs:    Medical: Not on file    Non-medical: Not on file  Tobacco Use  . Smoking status: Never Smoker  . Smokeless tobacco: Never Used  Substance and Sexual Activity  . Alcohol use: Yes    Comment: None since pregnancy  . Drug use: No  . Sexual activity: Yes    Birth control/protection: Injection  Lifestyle  . Physical activity:    Days per week: Not on file    Minutes per session: Not on file  . Stress: Not on file  Relationships  . Social connections:    Talks on phone: Not on file    Gets together: Not on file    Attends religious service: Not on file    Active member of club or organization: Not on  file    Attends meetings of clubs or organizations: Not on file    Relationship status: Not on file  . Intimate partner violence:    Fear of current or ex partner: Not on file    Emotionally abused: Not on file    Physically abused: Not on file    Forced sexual activity: Not on file  Other Topics Concern  . Not on file  Social History Narrative  . Not on file    Family History  Problem Relation Age of Onset  . Hypertension Father   . Alcohol abuse Neg Hx   . Arthritis Neg Hx   . Asthma Neg Hx   . Birth defects Neg Hx   . Cancer Neg Hx   . COPD Neg Hx   . Depression Neg Hx   . Diabetes Neg Hx   . Drug abuse Neg Hx   . Early death Neg Hx   . Hearing loss Neg Hx   . Heart disease Neg Hx   . Hyperlipidemia Neg Hx   . Kidney disease Neg Hx   . Learning disabilities Neg Hx   . Mental illness Neg Hx   . Mental retardation Neg Hx   . Miscarriages / Stillbirths Neg Hx   .  Stroke Neg Hx   . Vision loss Neg Hx   . Varicose Veins Neg Hx     The following portions of the patient's history were reviewed and updated as appropriate: allergies, current medications, past family history, past medical history, past social history, past surgical history and problem list.  Review of Systems  ROS negative except as noted above. Information obtained from patient.   Objective:   BP 124/72   Pulse 63   Ht 5\' 8"  (1.727 m)   Wt 154 lb 8 oz (70.1 kg)   BMI 23.49 kg/m    CONSTITUTIONAL: Well-developed, well-nourished female in no acute distress.   BREASTS: Breasts appear normal, no suspicious masses, no skin or nipple changes or axillary nodes.  Assessment:   1. Encounter for Depo-Provera contraception  - POCT urine pregnancy  2. Encounter for screening breast examination   Plan:   Encouraged routine care and health maintenance.   Depo given today, see orders.   Reviewed red flag symptoms and when to call.   RTC x 3 months for Depo-Provera injection or sooner if needed.    Gunnar Bulla, CNM [facility], [facility]

## 2018-07-22 ENCOUNTER — Encounter: Payer: Self-pay | Admitting: Certified Nurse Midwife

## 2018-07-23 ENCOUNTER — Ambulatory Visit: Payer: Medicaid Other | Admitting: Certified Nurse Midwife

## 2018-07-23 ENCOUNTER — Ambulatory Visit (INDEPENDENT_AMBULATORY_CARE_PROVIDER_SITE_OTHER): Payer: Medicaid Other | Admitting: Certified Nurse Midwife

## 2018-07-23 VITALS — BP 130/69 | HR 66 | Ht 68.0 in | Wt 150.0 lb

## 2018-07-23 DIAGNOSIS — Z3042 Encounter for surveillance of injectable contraceptive: Secondary | ICD-10-CM | POA: Diagnosis not present

## 2018-07-23 DIAGNOSIS — N6452 Nipple discharge: Secondary | ICD-10-CM

## 2018-07-23 MED ORDER — MEDROXYPROGESTERONE ACETATE 150 MG/ML IM SUSP
150.0000 mg | Freq: Once | INTRAMUSCULAR | Status: AC
  Administered 2018-07-23: 150 mg via INTRAMUSCULAR

## 2018-07-23 NOTE — Patient Instructions (Signed)
Skin Abscess A skin abscess is an infected area on or under your skin that contains pus and other material. An abscess can happen almost anywhere on your body. Some abscesses break open (rupture) on their own. Most continue to get worse unless they are treated. The infection can spread deeper into the body and into your blood, which can make you feel sick. Treatment usually involves draining the abscess. Follow these instructions at home: Abscess Care  If you have an abscess that has not drained, place a warm, clean, wet washcloth over the abscess several times a day. Do this as told by your doctor.  Follow instructions from your doctor about how to take care of your abscess. Make sure you: ? Cover the abscess with a bandage (dressing). ? Change your bandage or gauze as told by your doctor. ? Wash your hands with soap and water before you change the bandage or gauze. If you cannot use soap and water, use hand sanitizer.  Check your abscess every day for signs that the infection is getting worse. Check for: ? More redness, swelling, or pain. ? More fluid or blood. ? Warmth. ? More pus or a bad smell. Medicines   Take over-the-counter and prescription medicines only as told by your doctor.  If you were prescribed an antibiotic medicine, take it as told by your doctor. Do not stop taking the antibiotic even if you start to feel better. General instructions  To avoid spreading the infection: ? Do not share personal care items, towels, or hot tubs with others. ? Avoid making skin-to-skin contact with other people.  Keep all follow-up visits as told by your doctor. This is important. Contact a doctor if:  You have more redness, swelling, or pain around your abscess.  You have more fluid or blood coming from your abscess.  Your abscess feels warm when you touch it.  You have more pus or a bad smell coming from your abscess.  You have a fever.  Your muscles ache.  You have  chills.  You feel sick. Get help right away if:  You have very bad (severe) pain.  You see red streaks on your skin spreading away from the abscess. This information is not intended to replace advice given to you by your health care provider. Make sure you discuss any questions you have with your health care provider. Document Released: 02/04/2008 Document Revised: 04/13/2016 Document Reviewed: 06/27/2015 Elsevier Interactive Patient Education  2018 Elsevier Inc.  

## 2018-07-23 NOTE — Progress Notes (Signed)
GYN ENCOUNTER NOTE  Subjective:       Pamela [name] is a 32 y.o. [id number] female is here for gynecologic evaluation of the following issues:  1. Pt complains of painful  Right nipple that started 2 days ago. She states she had nipple rings in the past and is concerned she has infection. She had discharge ( white and red) that she expressed.    Gynecologic History No LMP recorded. Patient has had an injection. Contraception: Depo-Provera injections Last Pap: 12/29/2016. Results were: normal Last mammogram: n/a.   Obstetric History OB History  Gravida Para Term Preterm AB Living  4 2 2   2 2   SAB TAB Ectopic Multiple Live Births  1 1   0 2    # Outcome Date GA Lbr Len/2nd Weight Sex Delivery Anes PTL Lv  4 Term 07/22/17 [redacted]w[redacted]d  9 lb 11.2 oz (4.4 kg) F CS-LTranv Spinal  LIV  3 SAB 08/07/15 [redacted]w[redacted]d       ND  2 Term 12/01/08 [redacted]w[redacted]d  8 lb 11 oz (3.941 kg) F CS-LTranv EPI  LIV  1 TAB 2008        ND    Obstetric Comments  2016 [facility])    Past Medical History:  Diagnosis Date  . Headache   . History of recurrent UTIs   . Panic attacks   . Umbilical hernia     Past Surgical History:  Procedure Laterality Date  . CESAREAN SECTION  12/01/2008  . CESAREAN SECTION N/A 07/22/2017   Procedure: REPEAT CESAREAN SECTION (Female, 0948, 9lbs 11oz);  Surgeon: Linzie Collin, MD;  Location: [facility] ORS;  Service: Obstetrics;  Laterality: N/A;  . DILATION AND EVACUATION N/A 08/07/2015   Procedure: DILATATION AND EVACUATION;  Surgeon: Allie Bossier, MD;  Location: [facility];  Service: Gynecology;  Laterality: N/A;    Current Outpatient Medications on File Prior to Visit  Medication Sig Dispense Refill  . medroxyPROGESTERone (DEPO-PROVERA) 150 MG/ML injection Inject 1 mL (150 mg total) into the muscle every 3 (three) months. 1 mL 3   No current facility-administered medications on file prior to visit.     Allergies  Allergen Reactions  . Tramadol Itching    Social History   Socioeconomic  History  . Marital status: Single    Spouse name: Not on file  . Number of children: Not on file  . Years of education: Not on file  . Highest education level: Not on file  Occupational History  . Not on file  Social Needs  . Financial resource strain: Not on file  . Food insecurity:    Worry: Not on file    Inability: Not on file  . Transportation needs:    Medical: Not on file    Non-medical: Not on file  Tobacco Use  . Smoking status: Never Smoker  . Smokeless tobacco: Never Used  Substance and Sexual Activity  . Alcohol use: Yes    Comment: None since pregnancy  . Drug use: No  . Sexual activity: Yes    Birth control/protection: Injection  Lifestyle  . Physical activity:    Days per week: Not on file    Minutes per session: Not on file  . Stress: Not on file  Relationships  . Social connections:    Talks on phone: Not on file    Gets together: Not on file    Attends religious service: Not on file    Active member of club or organization: Not  on file    Attends meetings of clubs or organizations: Not on file    Relationship status: Not on file  . Intimate partner violence:    Fear of current or ex partner: Not on file    Emotionally abused: Not on file    Physically abused: Not on file    Forced sexual activity: Not on file  Other Topics Concern  . Not on file  Social History Narrative  . Not on file    Family History  Problem Relation Age of Onset  . Hypertension Father   . Alcohol abuse Neg Hx   . Arthritis Neg Hx   . Asthma Neg Hx   . Birth defects Neg Hx   . Cancer Neg Hx   . COPD Neg Hx   . Depression Neg Hx   . Diabetes Neg Hx   . Drug abuse Neg Hx   . Early death Neg Hx   . Hearing loss Neg Hx   . Heart disease Neg Hx   . Hyperlipidemia Neg Hx   . Kidney disease Neg Hx   . Learning disabilities Neg Hx   . Mental illness Neg Hx   . Mental retardation Neg Hx   . Miscarriages / Stillbirths Neg Hx   . Stroke Neg Hx   . Vision loss Neg Hx    . Varicose Veins Neg Hx     The following portions of the patient's history were reviewed and updated as appropriate: allergies, current medications, past family history, past medical history, past social history, past surgical history and problem list.  Review of Systems Review of Systems - Negative except as mentioned in HPI Review of Systems - General ROS: negative for - chills, fatigue, fever, hot flashes, malaise or night sweats Hematological and Lymphatic ROS: negative for - bleeding problems or swollen lymph nodes Gastrointestinal ROS: negative for - abdominal pain, blood in stools, change in bowel habits and nausea/vomiting Musculoskeletal ROS: negative for - joint pain, muscle pain or muscular weakness Genito-Urinary ROS: negative for - change in menstrual cycle, dysmenorrhea, dyspareunia, dysuria, genital discharge, genital ulcers, hematuria, incontinence, irregular/heavy menses, nocturia or pelvic pain.  Objective:   BP 130/69   Pulse 66   Ht 5\' 8"  (1.727 m)   Wt 150 lb (68 kg)   Breastfeeding? No   BMI 22.81 kg/m  CONSTITUTIONAL: Well-developed, well-nourished female in no acute distress.  HENT:  Normocephalic, atraumatic.  NECK: Normal range of motion, supple, no masses.  Normal thyroid.  SKIN: Skin is warm and dry. No rash noted. Not diaphoretic. No erythema. No pallor. NEUROLGIC: Alert and oriented to person, place, and time. PSYCHIATRIC: Normal mood and affect. Normal behavior. Normal judgment and thought content. CARDIOVASCULAR:Not Examined RESPIRATORY: Not Examined BREASTS:: breasts appear normal, no suspicious masses, no skin or nipple changes on or axillary nodes. Right nipple: on the side of nipple where the holes were from piercing white/blood discharge expressed by pt. It is tender to touch per pt. No redness or warmth to touch.  ABDOMEN: Soft, non distended; Non tender.  No Organomegaly. PELVIC:not indicated  MUSCULOSKELETAL: Normal range of motion. No  tenderness.  No cyanosis, clubbing, or edema.   Assessment:   Nipple discharge   Plan:   Aerobic/anerobic culture. Self help measures discussed. Tylenol/motrin for pain. Call if fever develops. Will follow up with results.   Doreene BurkeAnnie Mardee Clune, CNM

## 2018-07-27 LAB — ANAEROBIC AND AEROBIC CULTURE

## 2018-07-28 ENCOUNTER — Other Ambulatory Visit: Payer: Self-pay | Admitting: Certified Nurse Midwife

## 2018-07-28 MED ORDER — SULFAMETHOXAZOLE-TRIMETHOPRIM 800-160 MG PO TABS: 1.0000 | ORAL_TABLET | Freq: Two times a day (BID) | ORAL | 0 refills | Status: AC

## 2018-07-28 NOTE — Progress Notes (Signed)
Orders placed for bactrim due to skin abscess at base of nipple. Pt notified via my chart.   Doreene BurkeAnnie Wava Kildow, CNM

## 2018-10-22 ENCOUNTER — Encounter: Payer: Medicaid Other | Admitting: Obstetrics and Gynecology

## 2019-02-04 ENCOUNTER — Encounter: Payer: Medicaid Other | Admitting: Certified Nurse Midwife

## 2019-03-31 ENCOUNTER — Other Ambulatory Visit: Payer: Self-pay

## 2019-03-31 DIAGNOSIS — Z20822 Contact with and (suspected) exposure to covid-19: Secondary | ICD-10-CM

## 2019-04-02 LAB — NOVEL CORONAVIRUS, NAA: SARS-CoV-2, NAA: NOT DETECTED

## 2019-04-04 ENCOUNTER — Telehealth: Payer: Self-pay | Admitting: Certified Nurse Midwife

## 2019-04-04 NOTE — Telephone Encounter (Signed)
The patient called requesting a call back from her nurse of provider in regards to her gaining access to her and her children's Covid-19 test results. Please advise.

## 2019-04-05 NOTE — Telephone Encounter (Signed)
Called and spoke with patient.  Advised her we don't have access to her daughters record and she should contact her Pediatrician for those results.  Patient verbalized understanding.

## 2019-06-14 ENCOUNTER — Other Ambulatory Visit: Payer: Self-pay

## 2019-06-14 ENCOUNTER — Ambulatory Visit (LOCAL_COMMUNITY_HEALTH_CENTER): Payer: Medicaid Other

## 2019-06-14 VITALS — BP 111/70 | Ht 68.0 in | Wt 157.0 lb

## 2019-06-14 DIAGNOSIS — Z3201 Encounter for pregnancy test, result positive: Secondary | ICD-10-CM

## 2019-06-14 MED ORDER — PRENATAL VITAMIN 27-0.8 MG PO TABS: 1.0000 | ORAL_TABLET | Freq: Every day | ORAL | 0 refills | Status: DC

## 2019-06-14 NOTE — Progress Notes (Signed)
Pt plans prenatal care at [facility]; declines preadmit. Pt declines to talk with anyone today about depression, has no desire to hurt herself or others; pt declined [employer] and LCSW business card stating she has her own methods of dealing with the first trimester emotions.

## 2019-06-15 LAB — PREGNANCY, URINE: Preg Test, Ur: POSITIVE — AB

## 2019-06-24 ENCOUNTER — Encounter: Payer: Self-pay | Admitting: Certified Nurse Midwife

## 2019-07-06 DIAGNOSIS — J019 Acute sinusitis, unspecified: Secondary | ICD-10-CM | POA: Diagnosis not present

## 2019-07-06 DIAGNOSIS — Z209 Contact with and (suspected) exposure to unspecified communicable disease: Secondary | ICD-10-CM | POA: Diagnosis not present

## 2019-07-06 DIAGNOSIS — N946 Dysmenorrhea, unspecified: Secondary | ICD-10-CM | POA: Diagnosis not present

## 2019-07-06 DIAGNOSIS — N390 Urinary tract infection, site not specified: Secondary | ICD-10-CM | POA: Diagnosis not present

## 2019-07-06 DIAGNOSIS — B9689 Other specified bacterial agents as the cause of diseases classified elsewhere: Secondary | ICD-10-CM | POA: Diagnosis not present

## 2019-07-06 DIAGNOSIS — A499 Bacterial infection, unspecified: Secondary | ICD-10-CM | POA: Diagnosis not present

## 2019-07-06 DIAGNOSIS — H66001 Acute suppurative otitis media without spontaneous rupture of ear drum, right ear: Secondary | ICD-10-CM | POA: Diagnosis not present

## 2019-07-07 ENCOUNTER — Other Ambulatory Visit: Payer: Self-pay

## 2019-07-07 ENCOUNTER — Encounter: Payer: Self-pay | Admitting: Emergency Medicine

## 2019-07-07 ENCOUNTER — Ambulatory Visit
Admission: EM | Admit: 2019-07-07 | Discharge: 2019-07-08 | Disposition: A | Discharge status: Home / Self Care | Payer: Medicaid Other | Attending: Emergency Medicine | Admitting: Emergency Medicine

## 2019-07-07 DIAGNOSIS — Z885 Allergy status to narcotic agent status: Secondary | ICD-10-CM | POA: Diagnosis not present

## 2019-07-07 DIAGNOSIS — O071 Delayed or excessive hemorrhage following failed attempted termination of pregnancy: Secondary | ICD-10-CM | POA: Diagnosis not present

## 2019-07-07 DIAGNOSIS — Z3A00 Weeks of gestation of pregnancy not specified: Secondary | ICD-10-CM | POA: Diagnosis not present

## 2019-07-07 DIAGNOSIS — R000 Tachycardia, unspecified: Secondary | ICD-10-CM | POA: Diagnosis not present

## 2019-07-07 DIAGNOSIS — Z888 Allergy status to other drugs, medicaments and biological substances status: Secondary | ICD-10-CM | POA: Insufficient documentation

## 2019-07-07 DIAGNOSIS — O208 Other hemorrhage in early pregnancy: Secondary | ICD-10-CM | POA: Diagnosis not present

## 2019-07-07 DIAGNOSIS — Z03818 Encounter for observation for suspected exposure to other biological agents ruled out: Secondary | ICD-10-CM | POA: Diagnosis not present

## 2019-07-07 DIAGNOSIS — N939 Abnormal uterine and vaginal bleeding, unspecified: Secondary | ICD-10-CM | POA: Diagnosis not present

## 2019-07-07 DIAGNOSIS — O046 Delayed or excessive hemorrhage following (induced) termination of pregnancy: Secondary | ICD-10-CM | POA: Diagnosis not present

## 2019-07-07 DIAGNOSIS — O034 Incomplete spontaneous abortion without complication: Secondary | ICD-10-CM | POA: Diagnosis not present

## 2019-07-07 DIAGNOSIS — O209 Hemorrhage in early pregnancy, unspecified: Secondary | ICD-10-CM | POA: Diagnosis not present

## 2019-07-07 DIAGNOSIS — Z9889 Other specified postprocedural states: Secondary | ICD-10-CM

## 2019-07-07 DIAGNOSIS — Z3A08 8 weeks gestation of pregnancy: Secondary | ICD-10-CM | POA: Diagnosis not present

## 2019-07-07 LAB — HCG, QUANTITATIVE, PREGNANCY: hCG, Beta Chain, Quant, S: 35254 m[IU]/mL — ABNORMAL HIGH (ref <5)

## 2019-07-07 LAB — CBC
HCT: 37.5 % (ref 36.0–46.0)
Hemoglobin: 12.4 g/dL (ref 12.0–15.0)
MCH: 29.5 pg (ref 26.0–34.0)
MCHC: 33.1 g/dL (ref 30.0–36.0)
MCV: 89.1 fL (ref 80.0–100.0)
Platelets: 269 10*3/uL (ref 150–400)
RBC: 4.21 MIL/uL (ref 3.87–5.11)
RDW: 13.9 % (ref 11.5–15.5)
WBC: 14.5 10*3/uL — ABNORMAL HIGH (ref 4.0–10.5)
nRBC: 0 % (ref 0.0–0.2)

## 2019-07-07 LAB — COMPREHENSIVE METABOLIC PANEL
ALT: 58 U/L — ABNORMAL HIGH (ref 0–44)
AST: 48 U/L — ABNORMAL HIGH (ref 15–41)
Albumin: 3.5 g/dL (ref 3.5–5.0)
Alkaline Phosphatase: 81 U/L (ref 38–126)
Anion gap: 12 (ref 5–15)
BUN: 10 mg/dL (ref 6–20)
CO2: 25 mmol/L (ref 22–32)
Calcium: 9.5 mg/dL (ref 8.9–10.3)
Chloride: 100 mmol/L (ref 98–111)
Creatinine, Ser: 0.7 mg/dL (ref 0.44–1.00)
GFR calc Af Amer: >60 mL/min (ref >60)
GFR calc non Af Amer: >60 mL/min (ref >60)
Glucose, Bld: 117 mg/dL — ABNORMAL HIGH (ref 70–99)
Potassium: 3.4 mmol/L — ABNORMAL LOW (ref 3.5–5.1)
Sodium: 137 mmol/L (ref 135–145)
Total Bilirubin: 0.2 mg/dL — ABNORMAL LOW (ref 0.3–1.2)
Total Protein: 8.1 g/dL (ref 6.5–8.1)

## 2019-07-07 LAB — TYPE AND SCREEN
ABO/RH(D): A POS
Antibody Screen: NEGATIVE

## 2019-07-07 MED ORDER — ONDANSETRON HCL 4 MG/2ML IJ SOLN
INTRAMUSCULAR | Status: AC
  Administered 2019-07-07: 4 mg
  Filled 2019-07-07: qty 2

## 2019-07-07 MED ORDER — SODIUM CHLORIDE 0.9 % IV BOLUS
1000.0000 mL | Freq: Once | INTRAVENOUS | Status: AC
  Administered 2019-07-07: 1000 mL via INTRAVENOUS

## 2019-07-07 MED ORDER — MORPHINE SULFATE (PF) 2 MG/ML IV SOLN
INTRAVENOUS | Status: AC
  Administered 2019-07-07: 2 mg via INTRAVENOUS
  Filled 2019-07-07: qty 1

## 2019-07-07 NOTE — ED Triage Notes (Signed)
Pt states she had an abortion 2 weeks ago, was [redacted] weeks pregnant at the time. Tonight, pt began bleeding, 15 min ago began passing large clots, abdominal cramping and pressure. Tearful in triage, large clots noted in pts sweat pants.

## 2019-07-07 NOTE — H&P (Addendum)
Reason for Consult: Heavy vaginal bleeding s/p medical termination of pregnancy Referring Physician: Bayard Males, MD (ER Physician)  Pamela [name] is an 33 y.o. [phone] female with last menstrual period 03/13/2019 who presented to the Emergency Room today with complaints of heavy vaginal bleeding and passing large clots that started at approximately 3 pm today. She is currently noting 10/10 pain. Of note, patient has a recent history of medical termination of pregnancy at [redacted] weeks gestation ~ 2 weeks ago (performed in [place]).  Patient reports that after taking the medication for termination she had mild to moderate bleeding for 1 day, then it decreased to a " light period-like flow" for several days and then resolved.  Had no other issues until today. She denies dizziness or light-headedness.  She did endorse having chills around the time of her medical termination, however notes that she also had an ear infection at the time and had been prescribed antibiotics.   Pertinent Gynecological History: Menses: regular every month without intermenstrual spotting Contraception: none Blood transfusions: none Sexually transmitted diseases: no past history  Last pap: normal Date: 11/2016   OB History  Gravida Para Term Preterm AB Living  5 2 2  0 2 2  SAB TAB Ectopic Multiple Live Births  1 1 0 0 2    # Outcome Date GA Lbr Len/2nd Weight Sex Delivery Anes PTL Lv  5 Gravida           4 Term 07/22/17 [redacted]w[redacted]d  4400 g F CS-LTranv Spinal  LIV  3 SAB 08/07/15 [redacted]w[redacted]d       ND  2 Term 12/01/08 102w0d  3941 g F CS-LTranv EPI  LIV  1 TAB 2008        ND    Obstetric Comments  2016 (D&C)    Past Medical History:  Diagnosis Date  . Headache   . History of recurrent UTIs   . Panic attacks   . Umbilical hernia     Past Surgical History:  Procedure Laterality Date  . CESAREAN SECTION  12/01/2008  . CESAREAN SECTION N/A 07/22/2017   Procedure: REPEAT CESAREAN SECTION (Female,  0948, 9lbs 11oz);  Surgeon: 07/24/2017, MD;  Location: [facility] ORS;  Service: Obstetrics;  Laterality: N/A;  . DILATION AND EVACUATION N/A 08/07/2015   Procedure: DILATATION AND EVACUATION;  Surgeon: 14/01/2015, MD;  Location: [facility] ORS;  Service: Gynecology;  Laterality: N/A;    Family History  Problem Relation Age of Onset  . Hypertension Father   . Alcohol abuse Neg Hx   . Arthritis Neg Hx   . Asthma Neg Hx   . Birth defects Neg Hx   . Cancer Neg Hx   . COPD Neg Hx   . Depression Neg Hx   . Diabetes Neg Hx   . Drug abuse Neg Hx   . Early death Neg Hx   . Hearing loss Neg Hx   . Heart disease Neg Hx   . Hyperlipidemia Neg Hx   . Kidney disease Neg Hx   . Learning disabilities Neg Hx   . Mental illness Neg Hx   . Mental retardation Neg Hx   . Miscarriages / Stillbirths Neg Hx   . Stroke Neg Hx   . Vision loss Neg Hx   . Varicose Veins Neg Hx     Social History:  reports that she has never smoked. She has never used smokeless tobacco. She reports current alcohol use. She reports that  she does not use drugs.  Allergies:  Allergies  Allergen Reactions  . Tramadol Itching    Medications:  No current facility-administered medications on file prior to encounter.    Current Outpatient Medications on File Prior to Encounter  Medication Sig Dispense Refill  . acetaminophen (TYLENOL) 500 MG tablet Take 500 mg by mouth every 6 (six) hours as needed.    Marland Kitchen amoxicillin-clavulanate (AUGMENTIN) 875-125 MG tablet Take 1 tablet by mouth 2 (two) times daily.     . medroxyPROGESTERone (DEPO-PROVERA) 150 MG/ML injection Inject 1 mL (150 mg total) into the muscle every 3 (three) months. (Patient not taking: Reported on 06/14/2019) 1 mL 3  . Prenatal Vit-Fe Fumarate-FA (PRENATAL VITAMIN) 27-0.8 MG TABS Take 1 tablet by mouth daily. (Patient not taking: Reported on 07/07/2019) 100 tablet 0     Review of Systems  Constitutional: Negative for chills, diaphoresis and fever.  HENT:  Negative.   Eyes: Negative.   Respiratory: Negative for cough, sputum production and shortness of breath.   Cardiovascular: Negative for chest pain and palpitations.  Gastrointestinal: Positive for abdominal pain. Negative for constipation, diarrhea, heartburn, nausea and vomiting.  Genitourinary: Negative for dysuria, flank pain, frequency, hematuria and urgency.  Musculoskeletal: Negative.   Skin: Negative.   Neurological: Negative.   Endo/Heme/Allergies: Negative.   Psychiatric/Behavioral: Negative.     Blood pressure (!) 129/105, pulse (!) 121, temperature 98.8 F (37.1 C), temperature source Oral, resp. rate 18, height  (1.727 m), weight 68 kg, last menstrual period 03/13/2019, SpO2 100 %, unknown if currently breastfeeding. Physical Exam  Constitutional: She is oriented to person, place, and time. She appears well-developed and well-nourished. No distress.  HENT:  Head: Normocephalic and atraumatic.  Mouth/Throat: No oropharyngeal exudate.  Eyes: Pupils are equal, round, and reactive to light. Conjunctivae are normal. No scleral icterus.  Neck: Normal range of motion. Neck supple. No thyromegaly present.  Cardiovascular: Regular rhythm and normal heart sounds.  Tachycardia  Respiratory: Effort normal and breath sounds normal. No respiratory distress.  GI: Soft. Bowel sounds are normal. She exhibits no distension. There is abdominal tenderness. There is no rebound.  Genitourinary:    Genitourinary Comments: external genitalia normal with blood clots noted on vulva.  Rectovaginal septum normal.  Vagina with large amount of blood with clots present. Attempted to clear clots, noted products of conception protruding from cervical os. Removed small fragments from os, including what appeared to be the fetus. Scant placental tissue retrieved.  Difficult to continue exam due to heavy blood flow.   Cervix otherwise normal appearing, no lesions and no motion tenderness.  Bimanual exam not  performed due to patient discomfort.    Musculoskeletal: Normal range of motion.        General: No deformity or edema.  Neurological: She is alert and oriented to person, place, and time.  Skin: Skin is warm and dry. No rash noted. No erythema.    Results for orders placed or performed during the hospital encounter of 07/07/19 (from the past 48 hour(s))  Comprehensive metabolic panel     Status: Abnormal   Collection Time: 07/07/19 10:38 PM  Result Value Ref Range   Sodium 137 135 - 145 mmol/L   Potassium 3.4 (L) 3.5 - 5.1 mmol/L   Chloride 100 98 - 111 mmol/L   CO2 25 22 - 32 mmol/L   Glucose, Bld 117 (H) 70 - 99 mg/dL   BUN 10 6 - 20 mg/dL   Creatinine, Ser 8.29 0.44 -  1.00 mg/dL   Calcium 9.5 8.9 - 10.3 mg/dL   Total Protein 8.1 6.5 - 8.1 g/dL   Albumin 3.5 3.5 - 5.0 g/dL   AST 48 (H) 15 - 41 U/L   ALT 58 (H) 0 - 44 U/L   Alkaline Phosphatase 81 38 - 126 U/L   Total Bilirubin 0.2 (L) 0.3 - 1.2 mg/dL   GFR calc non Af Amer >60 >60 mL/min   GFR calc Af Amer >60 >60 mL/min   Anion gap 12 5 - 15    Comment: Performed at [facility], [address] [place], [place] [address]  CBC     Status: Abnormal   Collection Time: 07/07/19 10:38 PM  Result Value Ref Range   WBC 14.5 (H) 4.0 - 10.5 K/uL   RBC 4.21 3.87 - 5.11 MIL/uL   Hemoglobin 12.4 12.0 - 15.0 g/dL   HCT 37.5 36.0 - 46.0 %   MCV 89.1 80.0 - 100.0 fL   MCH 29.5 26.0 - 34.0 pg   MCHC 33.1 30.0 - 36.0 g/dL   RDW 13.9 11.5 - 15.5 %   Platelets 269 150 - 400 K/uL   nRBC 0.0 0.0 - 0.2 %    Comment: Performed at [facility], [address] [place], [place] [address]  Type and screen [place]     Status: None   Collection Time: 07/07/19 10:38 PM  Result Value Ref Range   ABO/RH(D) A POS    Antibody Screen NEG    Sample Expiration      07/10/2019,2359 Performed at [facility], [address] [place], [place] [address]   hCG, quantitative, pregnancy      Status: Abnormal   Collection Time: 07/07/19 10:38 PM  Result Value Ref Range   hCG, Beta Chain, Quant, S 35,254 (H) <5 mIU/mL    Comment:          GEST. AGE      CONC.  (mIU/mL)   <=1 WEEK        5 - 50     2 WEEKS       50 - 500     3 WEEKS       100 - 10,000     4 WEEKS     1,000 - 30,000     5 WEEKS     3,500 - 115,000   6-8 WEEKS     12,000 - 270,000    12 WEEKS     15,000 - 220,000        FEMALE AND NON-PREGNANT FEMALE:     LESS THAN 5 mIU/mL Performed at [facility], [address] [place], [place] [address]     US Ob Less Than 14 Weeks With Ob Transvaginal  Result Date: 07/08/2019 CLINICAL DATA:  Abnormal vaginal bleeding. Status post elective abortion 2 weeks prior. EXAM: OBSTETRIC <14 WK [place] AND TRANSVAGINAL OB US TECHNIQUE: Both transabdominal and transvaginal ultrasound examinations were performed for complete evaluation of the gestation as well as the maternal uterus, adnexal regions, and pelvic cul-de-sac. Transvaginal technique was performed to assess early pregnancy. COMPARISON:  July 17, 2017 FINDINGS: Intrauterine gestational sac: None Yolk sac:  Not Visualized. Embryo:  Not Visualized. Cardiac Activity: Not Visualized. Maternal uterus/adnexae: The right ovary measures 5.2 x 2.2 x 2.2 cm. There is a dominant 1.5 cm follicle. The left ovary measures 3.5 x 3.1 x 2.4 cm. The endometrium is thickened and heterogeneous and hypervascular. Thickness  is difficult to accurately measure. IMPRESSION: 1. No IUP. 2. Thickened hypervascular, heterogeneous endometrium. Findings may be secondary to retained products of conception. 3. Unremarkable appearance of the ovaries. Electronically Signed   By: Katherine Mantlehristopher  Green M.D.   On: 07/08/2019 01:01       Assessment/Plan: - Incomplete abortion as fetal products noted at external os. Patient s/p failed medical termination. Discussed management with dilation and evacuation. Risks of surgery including bleeding, infection, injury  to surrounding organs, need for additional procedures, possibility of intrauterine scarring which may impair future fertility, risk of retained products which may require further management and other postoperative/anesthesia complications will be explained to patient.  Consent signed.  Products of conception removed during patient's exam sent to pathology.  Currently hemodynamically stable with only mild tachycardia despite heavy vaginal bleeding at this time.  - [facility] assessment for sinusitus yesterday, was negative. Reviewed and confirmed info in Care Everywhere. Conveyed information to surgical team, ok to use recent testing. Can proceed to the OR. Antibiotics ordered on call to OR.    Hildred LaserAnika Brayden Betters, MD [facility] 07/07/2019

## 2019-07-07 NOTE — ED Provider Notes (Signed)
[facility] Emergency Department Provider Note  ____________________________________________   First MD Initiated Contact with Patient 07/07/19 2244     (approximate)  I have reviewed the triage vital signs and the nursing notes.   HISTORY  Chief Complaint Vaginal Bleeding    HPI Pamela [name] is a 33 y.o. female with below list of previous medical conditions including recent elective patient induced abortion at [redacted] weeks pregnant which was performed 2 weeks ago presents to the emergency department with acute onset of heavy vaginal bleeding with "very large clots" 15 minutes before arrival.  Patient admits to 10 out of 10 pelvic pain.  Patient denies any dizziness or lightheadedness.     Past Medical History:  Diagnosis Date  . Headache   . History of recurrent UTIs   . Panic attacks   . Umbilical hernia   . Umbilical hernia     Patient Active Problem List   Diagnosis Date Noted  . Nipple discharge in female 07/23/2018  . Umbilical hernia 01/28/2017  . History of 2 cesarean sections 01/28/2017    Past Surgical History:  Procedure Laterality Date  . CESAREAN SECTION  12/01/2008  . CESAREAN SECTION N/A 07/22/2017   Procedure: REPEAT CESAREAN SECTION (Female, 0948, 9lbs 11oz);  Surgeon: Linzie Collin, MD;  Location: [facility] ORS;  Service: Obstetrics;  Laterality: N/A;  . DILATION AND EVACUATION N/A 08/07/2015   Procedure: DILATATION AND EVACUATION;  Surgeon: Allie Bossier, MD;  Location: [facility] ORS;  Service: Gynecology;  Laterality: N/A;    Prior to Admission medications   Medication Sig Start Date End Date Taking? Authorizing Provider  amoxicillin-clavulanate (AUGMENTIN) 875-125 MG tablet Take 1 tablet by mouth 2 (two) times daily.  07/06/19 07/16/19 Yes [provider]  acetaminophen (TYLENOL) 500 MG tablet Take 2 tablets (1,000 mg total) by mouth every 8 (eight) hours as needed for mild pain or moderate pain. 07/08/19   Hildred Laser, MD   ibuprofen (ADVIL) 800 MG tablet Take 1 tablet (800 mg total) by mouth every 8 (eight) hours as needed for mild pain, moderate pain or cramping. 07/08/19   Hildred Laser, MD    Allergies Tramadol  Family History  Problem Relation Age of Onset  . Hypertension Father   . Alcohol abuse Neg Hx   . Arthritis Neg Hx   . Asthma Neg Hx   . Birth defects Neg Hx   . Cancer Neg Hx   . COPD Neg Hx   . Depression Neg Hx   . Diabetes Neg Hx   . Drug abuse Neg Hx   . Early death Neg Hx   . Hearing loss Neg Hx   . Heart disease Neg Hx   . Hyperlipidemia Neg Hx   . Kidney disease Neg Hx   . Learning disabilities Neg Hx   . Mental illness Neg Hx   . Mental retardation Neg Hx   . Miscarriages / Stillbirths Neg Hx   . Stroke Neg Hx   . Vision loss Neg Hx   . Varicose Veins Neg Hx     Social History Social History   Tobacco Use  . Smoking status: Never Smoker  . Smokeless tobacco: Never Used  Substance Use Topics  . Alcohol use: Yes    Comment: maybe some alcohol about 2 weeeks ago  . Drug use: No    Review of Systems Constitutional: No fever/chills Eyes: No visual changes. ENT: No sore throat. Cardiovascular: Denies chest pain. Respiratory: Denies shortness  of breath. Gastrointestinal: Positive for pelvic pain no nausea, no vomiting.  No diarrhea.  No constipation. Genitourinary: Positive for heavy vaginal bleeding Musculoskeletal: Negative for neck pain.  Negative for back pain. Integumentary: Negative for rash. Neurological: Negative for headaches, focal weakness or numbness.   ____________________________________________   PHYSICAL EXAM:  VITAL SIGNS: ED Triage Vitals  Enc Vitals Group     BP 07/07/19 2224 (!) 187/124     Pulse Rate 07/07/19 2224 (!) 151     Resp 07/07/19 2224 16     Temp 07/07/19 2236 98.8 F (37.1 C)     Temp Source 07/07/19 2236 Oral     SpO2 07/07/19 2224 100 %     Weight 07/07/19 2232 68 kg (150 lb)     Height 07/07/19 2232 1.727 m (5\' 8" )      Head Circumference --      Peak Flow --      Pain Score 07/07/19 2232 5     Pain Loc --      Pain Edu? --      Excl. in [place]? --     Constitutional: Alert and oriented.  Apparent discomfort Eyes: Conjunctivae are normal.  Mouth/Throat: Patient is wearing a mask. Neck: No stridor.  No meningeal signs.   Cardiovascular: Normal rate, regular rhythm. Good peripheral circulation. Grossly normal heart sounds. Respiratory: Normal respiratory effort.  No retractions. Gastrointestinal: Soft and nontender. No distention.  Genitourinary: Heavy vaginal bleeding inability to visualize the cervix despite removing multiple large clots  Musculoskeletal: No lower extremity tenderness nor edema. No gross deformities of extremities. Neurologic:  Normal speech and language. No gross focal neurologic deficits are appreciated.  Skin:  Skin is warm, dry and intact. Psychiatric: Mood and affect are normal. Speech and behavior are normal.  ____________________________________________   LABS (all labs ordered are listed, but only abnormal results are displayed)  Labs Reviewed  COMPREHENSIVE METABOLIC PANEL - Abnormal; Notable for the following components:      Result Value   Potassium 3.4 (*)    Glucose, Bld 117 (*)    AST 48 (*)    ALT 58 (*)    Total Bilirubin 0.2 (*)    All other components within normal limits  CBC - Abnormal; Notable for the following components:   WBC 14.5 (*)    All other components within normal limits  HCG, QUANTITATIVE, PREGNANCY - Abnormal; Notable for the following components:   hCG, Beta Chain, Quant, S 35,254 (*)    All other components within normal limits  SAR COV2 SEROLOGY (COVID19)AB(IGG),IA  HIV ANTIBODY (ROUTINE TESTING W REFLEX)  POC URINE PREG, ED  TYPE AND SCREEN  SURGICAL PATHOLOGY   ____________________________________________  EKG  ED ECG REPORT I, [place] N , the attending physician, personally viewed and interpreted this ECG.   Date:  07/07/2019  EKG Time: 10:35 PM  Rate: 119  Rhythm: Normal sinus rhythm  Axis: Normal  Intervals: Normal  ST&T Change: None  ____________________________________________  RADIOLOGY I, [place] N , personally viewed and evaluated these images (plain radiographs) as part of my medical decision making, as well as reviewing the written report by the radiologist.  ED MD interpretation: No IUP thickened hypervascular heterogeneous endometrium concerning for retained products of conception.  Official radiology report(s): US Ob Less Than 14 Weeks With Ob Transvaginal  Result Date: 07/08/2019 CLINICAL DATA:  Abnormal vaginal bleeding. Status post elective abortion 2 weeks prior. EXAM: OBSTETRIC <14 WK [place] AND TRANSVAGINAL OB US TECHNIQUE: Both  transabdominal and transvaginal ultrasound examinations were performed for complete evaluation of the gestation as well as the maternal uterus, adnexal regions, and pelvic cul-de-sac. Transvaginal technique was performed to assess early pregnancy. COMPARISON:  July 17, 2017 FINDINGS: Intrauterine gestational sac: None Yolk sac:  Not Visualized. Embryo:  Not Visualized. Cardiac Activity: Not Visualized. Maternal uterus/adnexae: The right ovary measures 5.2 x 2.2 x 2.2 cm. There is a dominant 1.5 cm follicle. The left ovary measures 3.5 x 3.1 x 2.4 cm. The endometrium is thickened and heterogeneous and hypervascular. Thickness is difficult to accurately measure. IMPRESSION: 1. No IUP. 2. Thickened hypervascular, heterogeneous endometrium. Findings may be secondary to retained products of conception. 3. Unremarkable appearance of the ovaries. Electronically Signed   By: Katherine Mantle M.D.   On: 07/08/2019 01:01    ____________________________________________   PROCEDURES    .Critical Care Performed by: Darci Current, MD Authorized by: Darci Current, MD   Critical care provider statement:    Critical care time (minutes):  30   Critical  care time was exclusive of:  Separately billable procedures and treating other patients   Critical care was time spent personally by me on the following activities:  Development of treatment plan with patient or surrogate, discussions with consultants, evaluation of patient's response to treatment, examination of patient, obtaining history from patient or surrogate, ordering and performing treatments and interventions, ordering and review of laboratory studies, ordering and review of radiographic studies, pulse oximetry, re-evaluation of patient's condition and review of old charts     ____________________________________________   INITIAL IMPRESSION / MDM / ASSESSMENT AND PLAN / ED COURSE  As part of my medical decision making, I reviewed the following data within the electronic MEDICAL RECORD NUMBER   33 year old female presented with above-stated history and physical exam concerning for retained products of conception and active movement miscarriage.  Patient given IV morphine 4 mg secondary to considerable pain.  Ultrasound consistent with possible retained products.  Dr. Valentino Saxon presented to the emergency department and evaluated patient before ultrasound was performed with plan to take the patient to the operating room.  ____________________________________________  FINAL CLINICAL IMPRESSION(S) / ED DIAGNOSES  Final diagnoses:  Abnormal vaginal bleeding  Vaginal hemorrhage     MEDICATIONS GIVEN DURING THIS VISIT:  Medications  lactated ringers infusion ( Intravenous Anesthesia Volume Adjustment 07/08/19 0139)  fentaNYL (SUBLIMAZE) injection 25 mcg (has no administration in time range)  oxyCODONE (Oxy IR/ROXICODONE) immediate release tablet 5 mg (has no administration in time range)    Or  oxyCODONE (ROXICODONE) 5 MG/5ML solution 5 mg (has no administration in time range)  promethazine (PHENERGAN) injection 6.25-12.5 mg (has no administration in time range)  ondansetron (ZOFRAN) 4  MG/2ML injection (4 mg  Given 07/07/19 2303)  morphine 2 MG/ML injection (2 mg Intravenous Given 07/07/19 2304)  sodium chloride 0.9 % bolus 1,000 mL (0 mLs Intravenous Stopped 07/08/19 0027)  sodium chloride 0.9 % bolus 1,000 mL (1,000 mLs Intravenous New Bag/Given 07/07/19 2306)  doxycycline (VIBRAMYCIN) 200 mg in dextrose 5 % 250 mL IVPB (200 mg Intravenous New Bag/Given 07/08/19 0122)  fentaNYL (SUBLIMAZE) 100 MCG/2ML injection (has no administration in time range)  lidocaine (XYLOCAINE) 2 % injection (has no administration in time range)  dexamethasone (DECADRON) 10 MG/ML injection (has no administration in time range)  ondansetron (ZOFRAN) 4 MG/2ML injection (has no administration in time range)  lidocaine (PF) (XYLOCAINE) 1 % injection (has no administration in time range)  dexmedetomidine 80 MCG/20ML SOLN (has  no administration in time range)  fentaNYL (SUBLIMAZE) 100 MCG/2ML injection (has no administration in time range)     ED Discharge Orders         Ordered    acetaminophen (TYLENOL) 500 MG tablet  Every 8 hours PRN     07/08/19 0144    ibuprofen (ADVIL) 800 MG tablet  Every 8 hours PRN     07/08/19 0144    Discharge patient     07/08/19 0144          *Please note:  Madisen L Aulds was evaluated in Emergency Department on 07/08/2019 for the symptoms described in the history of present illness. She was evaluated in the context of the global COVID-19 pandemic, which necessitated consideration that the patient might be at risk for infection with the SARS-CoV-2 virus that causes COVID-19. Institutional protocols and algorithms that pertain to the evaluation of patients at risk for COVID-19 are in a state of rapid change based on information released by regulatory bodies including the CDC and federal and state organizations. These policies and algorithms were followed during the patient's care in the ED.  Some ED evaluations and interventions may be delayed as a result of limited  staffing during the pandemic.*  Note:  This document was prepared using Dragon voice recognition software and may include unintentional dictation errors.   Darci CurrentBrown, [place] N, MD 07/08/19 [phone]

## 2019-07-07 NOTE — ED Notes (Signed)
[place] unable to take pt until a positive or negative preg is back.

## 2019-07-08 ENCOUNTER — Encounter: Payer: Self-pay | Admitting: Obstetrics and Gynecology

## 2019-07-08 ENCOUNTER — Emergency Department: Payer: Medicaid Other

## 2019-07-08 ENCOUNTER — Emergency Department: Payer: Medicaid Other | Admitting: Anesthesiology

## 2019-07-08 ENCOUNTER — Encounter
Admission: EM | Disposition: A | Discharge status: Home / Self Care | Payer: Self-pay | Source: Home / Self Care | Attending: Emergency Medicine

## 2019-07-08 DIAGNOSIS — N939 Abnormal uterine and vaginal bleeding, unspecified: Secondary | ICD-10-CM | POA: Diagnosis not present

## 2019-07-08 DIAGNOSIS — O034 Incomplete spontaneous abortion without complication: Secondary | ICD-10-CM | POA: Diagnosis not present

## 2019-07-08 DIAGNOSIS — O209 Hemorrhage in early pregnancy, unspecified: Secondary | ICD-10-CM | POA: Diagnosis not present

## 2019-07-08 DIAGNOSIS — Z3A00 Weeks of gestation of pregnancy not specified: Secondary | ICD-10-CM | POA: Diagnosis not present

## 2019-07-08 HISTORY — PX: DILATION AND EVACUATION: SHX1459

## 2019-07-08 SURGERY — DILATION AND EVACUATION, UTERUS
Anesthesia: General | Site: Vagina

## 2019-07-08 MED ORDER — PROPOFOL 10 MG/ML IV BOLUS
INTRAVENOUS | Status: DC | PRN
  Administered 2019-07-08: 200 mg via INTRAVENOUS

## 2019-07-08 MED ORDER — ONDANSETRON HCL 4 MG/2ML IJ SOLN
INTRAMUSCULAR | Status: DC | PRN
  Administered 2019-07-08: 4 mg via INTRAVENOUS

## 2019-07-08 MED ORDER — LIDOCAINE HCL (PF) 1 % IJ SOLN
INTRAMUSCULAR | Status: AC
  Filled 2019-07-08: qty 30

## 2019-07-08 MED ORDER — DEXMEDETOMIDINE HCL IN NACL 80 MCG/20ML IV SOLN
INTRAVENOUS | Status: AC
  Filled 2019-07-08: qty 20

## 2019-07-08 MED ORDER — IBUPROFEN 800 MG PO TABS: 800.0000 mg | ORAL_TABLET | Freq: Three times a day (TID) | ORAL | 1 refills | Status: DC | PRN

## 2019-07-08 MED ORDER — DOXYCYCLINE HYCLATE 100 MG IV SOLR
200.0000 mg | INTRAVENOUS | Status: AC
  Administered 2019-07-08: 01:00:00 200 mg via INTRAVENOUS
  Filled 2019-07-08: qty 200

## 2019-07-08 MED ORDER — SUCCINYLCHOLINE CHLORIDE 20 MG/ML IJ SOLN
INTRAMUSCULAR | Status: DC | PRN
  Administered 2019-07-08: 100 mg via INTRAVENOUS

## 2019-07-08 MED ORDER — LIDOCAINE HCL 1 % IJ SOLN
INTRAMUSCULAR | Status: DC | PRN
  Administered 2019-07-08: 10 mL

## 2019-07-08 MED ORDER — DEXMEDETOMIDINE HCL IN NACL 200 MCG/50ML IV SOLN
INTRAVENOUS | Status: DC | PRN
  Administered 2019-07-08: 12 ug via INTRAVENOUS

## 2019-07-08 MED ORDER — FENTANYL CITRATE (PF) 100 MCG/2ML IJ SOLN: 25.0000 ug | INTRAMUSCULAR | Status: DC | PRN

## 2019-07-08 MED ORDER — OXYCODONE HCL 5 MG PO TABS: 5.0000 mg | ORAL_TABLET | Freq: Once | ORAL | Status: DC | PRN

## 2019-07-08 MED ORDER — LIDOCAINE HCL (CARDIAC) PF 100 MG/5ML IV SOSY
PREFILLED_SYRINGE | INTRAVENOUS | Status: DC | PRN
  Administered 2019-07-08: 50 mg via INTRAVENOUS

## 2019-07-08 MED ORDER — ONDANSETRON HCL 4 MG/2ML IJ SOLN
INTRAMUSCULAR | Status: AC
  Filled 2019-07-08: qty 2

## 2019-07-08 MED ORDER — PROMETHAZINE HCL 25 MG/ML IJ SOLN: 6.2500 mg | INTRAMUSCULAR | Status: DC | PRN

## 2019-07-08 MED ORDER — LIDOCAINE HCL (PF) 2 % IJ SOLN
INTRAMUSCULAR | Status: AC
  Filled 2019-07-08: qty 10

## 2019-07-08 MED ORDER — DEXAMETHASONE SODIUM PHOSPHATE 10 MG/ML IJ SOLN
INTRAMUSCULAR | Status: AC
  Filled 2019-07-08: qty 1

## 2019-07-08 MED ORDER — ACETAMINOPHEN 500 MG PO TABS: 1000.0000 mg | ORAL_TABLET | Freq: Three times a day (TID) | ORAL | 0 refills | Status: DC | PRN

## 2019-07-08 MED ORDER — DEXAMETHASONE SODIUM PHOSPHATE 10 MG/ML IJ SOLN
INTRAMUSCULAR | Status: DC | PRN
  Administered 2019-07-08: 10 mg via INTRAVENOUS

## 2019-07-08 MED ORDER — LACTATED RINGERS IV SOLN
INTRAVENOUS | Status: DC
  Administered 2019-07-08: 01:00:00 via INTRAVENOUS

## 2019-07-08 MED ORDER — FENTANYL CITRATE (PF) 100 MCG/2ML IJ SOLN
INTRAMUSCULAR | Status: AC
  Filled 2019-07-08: qty 2

## 2019-07-08 MED ORDER — OXYCODONE HCL 5 MG/5ML PO SOLN: 5.0000 mg | Freq: Once | ORAL | Status: DC | PRN

## 2019-07-08 MED ORDER — PHENYLEPHRINE HCL (PRESSORS) 10 MG/ML IV SOLN
INTRAVENOUS | Status: DC | PRN
  Administered 2019-07-08: 200 ug via INTRAVENOUS
  Administered 2019-07-08 (×2): 100 ug via INTRAVENOUS

## 2019-07-08 MED ORDER — FENTANYL CITRATE (PF) 100 MCG/2ML IJ SOLN
INTRAMUSCULAR | Status: DC | PRN
  Administered 2019-07-08: 100 ug via INTRAVENOUS
  Administered 2019-07-08 (×2): 50 ug via INTRAVENOUS

## 2019-07-08 SURGICAL SUPPLY — 28 items
CATH ROBINSON RED A/P 16FR (CATHETERS) ×3 IMPLANT
COVER WAND RF STERILE (DRAPES) IMPLANT
DRSG TELFA 3X8 NADH (GAUZE/BANDAGES/DRESSINGS) ×3 IMPLANT
FILTER UTR ASPR SPEC (MISCELLANEOUS) ×1 IMPLANT
FLTR UTR ASPR SPEC (MISCELLANEOUS) ×3
GAUZE 4X4 16PLY RFD (DISPOSABLE) ×3 IMPLANT
GLOVE BIO SURGEON STRL SZ 6.5 (GLOVE) ×2 IMPLANT
GLOVE BIO SURGEON STRL SZ7 (GLOVE) ×3 IMPLANT
GLOVE BIO SURGEONS STRL SZ 6.5 (GLOVE) ×1
GLOVE BIOGEL PI IND STRL 7.5 (GLOVE) ×1 IMPLANT
GLOVE BIOGEL PI INDICATOR 7.5 (GLOVE) ×2
GLOVE INDICATOR 7.0 STRL GRN (GLOVE) ×3 IMPLANT
GOWN STRL REUS W/ TWL LRG LVL3 (GOWN DISPOSABLE) ×2 IMPLANT
GOWN STRL REUS W/TWL LRG LVL3 (GOWN DISPOSABLE) ×4
KIT BERKELEY 1ST TRIMESTER 3/8 (MISCELLANEOUS) ×3 IMPLANT
KIT TURNOVER KIT A (KITS) ×3 IMPLANT
NEEDLE HYPO 25X1 1.5 SAFETY (NEEDLE) ×3 IMPLANT
NEEDLE SPNL 22GX5 LNG QUINC BK (NEEDLE) ×3 IMPLANT
PACK DNC HYST (MISCELLANEOUS) ×3 IMPLANT
PAD OB MATERNITY 4.3X12.25 (PERSONAL CARE ITEMS) ×3 IMPLANT
PAD PREP 24X41 OB/GYN DISP (PERSONAL CARE ITEMS) ×3 IMPLANT
SET BERKELEY SUCTION TUBING (SUCTIONS) ×3 IMPLANT
SOL PREP PVP 2OZ (MISCELLANEOUS) ×3
SOLUTION PREP PVP 2OZ (MISCELLANEOUS) ×1 IMPLANT
SYR 10ML LL (SYRINGE) ×3 IMPLANT
VACURETTE 10 RIGID CVD (CANNULA) IMPLANT
VACURETTE 12 RIGID CVD (CANNULA) IMPLANT
VACURETTE 8 RIGID CVD (CANNULA) ×3 IMPLANT

## 2019-07-08 NOTE — Anesthesia Preprocedure Evaluation (Signed)
Anesthesia Evaluation  Patient identified by MRN, date of birth, ID band Patient awake    Reviewed: Allergy & Precautions, H&P , NPO status , Patient's Chart, lab work & pertinent test results  Airway Mallampati: II  TM Distance: >3 FB Neck ROM: full    Dental  (+) Teeth Intact   Pulmonary neg pulmonary ROS, neg shortness of breath, neg recent URI,           Cardiovascular (-) anginanegative cardio ROS  (-) dysrhythmias      Neuro/Psych  Headaches, PSYCHIATRIC DISORDERS Anxiety    GI/Hepatic negative GI ROS, Neg liver ROS,   Endo/Other  negative endocrine ROS  Renal/GU      Musculoskeletal   Abdominal   Peds  Hematology negative hematology ROS (+)   Anesthesia Other Findings Past Medical History: No date: Headache No date: History of recurrent UTIs No date: Panic attacks No date: Umbilical hernia No date: Umbilical hernia  Past Surgical History: 12/01/2008: CESAREAN SECTION 07/22/2017: CESAREAN SECTION; N/A     Comment:  Procedure: REPEAT CESAREAN SECTION (Female, 0948, 9lbs               11oz);  Surgeon: Harlin Heys, MD;  Location: [facility]               ORS;  Service: Obstetrics;  Laterality: N/A; 08/07/2015: DILATION AND EVACUATION; N/A     Comment:  Procedure: DILATATION AND EVACUATION;  Surgeon: Emily Filbert, MD;  Location: [facility];  Service: Gynecology;                Laterality: N/A;  BMI    Body Mass Index: 22.81 kg/m      Reproductive/Obstetrics negative OB ROS                             Anesthesia Physical Anesthesia Plan  ASA: II  Anesthesia Plan: General ETT   Post-op Pain Management:    Induction:   PONV Risk Score and Plan: Ondansetron, Dexamethasone, Midazolam and Treatment may vary due to age or medical condition  Airway Management Planned:   Additional Equipment:   Intra-op Plan:   Post-operative Plan:   Informed Consent: I  have reviewed the patients History and Physical, chart, labs and discussed the procedure including the risks, benefits and alternatives for the proposed anesthesia with the patient or authorized representative who has indicated his/her understanding and acceptance.     Dental Advisory Given  Plan Discussed with: Anesthesiologist, CRNA and Surgeon  Anesthesia Plan Comments:         Anesthesia Quick Evaluation

## 2019-07-08 NOTE — Discharge Instructions (Signed)
Dilation and Curettage or Vacuum Curettage, Care After °This sheet gives you information about how to care for yourself after your procedure. Your health care provider may also give you more specific instructions. If you have problems or questions, contact your health care provider. °What can I expect after the procedure? °After your procedure, it is common to have: °· Mild pain or cramping. °· Some vaginal bleeding or spotting. °These may last for up to 2 weeks after your procedure. °Follow these instructions at home: °Activity ° °· Do not drive or use heavy machinery while taking prescription pain medicine. °· Avoid driving for the first 24 hours after your procedure. °· Take frequent, short walks, followed by rest periods, throughout the day. Ask your health care provider what activities are safe for you. After 1-2 days, you may be able to return to your normal activities. °· Do not lift anything heavier than 10 lb (4.5 kg) until your health care provider approves. °· For at least 2 weeks, or as long as told by your health care provider, do not: °? Douche. °? Use tampons. °? Have sexual intercourse. °General instructions ° °· Take over-the-counter and prescription medicines only as told by your health care provider. This is especially important if you take blood thinning medicine. °· Do not take baths, swim, or use a hot tub until your health care provider approves. Take showers instead of baths. °· Wear compression stockings as told by your health care provider. These stockings help to prevent blood clots and reduce swelling in your legs. °· It is your responsibility to get the results of your procedure. Ask your health care provider, or the department performing the procedure, when your results will be ready. °· Keep all follow-up visits as told by your health care provider. This is important. °Contact a health care provider if: °· You have severe cramps that get worse or that do not get better with  medicine. °· You have severe abdominal pain. °· You cannot drink fluids without vomiting. °· You develop pain in a different area of your pelvis. °· You have bad-smelling vaginal discharge. °· You have a rash. °Get help right away if: °· You have vaginal bleeding that soaks more than one sanitary pad in 1 hour, for 2 hours in a row. °· You pass large blood clots from your vagina. °· You have a fever that is above 100.4°F (38.0°C). °· Your abdomen feels very tender or hard. °· You have chest pain. °· You have shortness of breath. °· You cough up blood. °· You feel dizzy or light-headed. °· You faint. °· You have pain in your neck or shoulder area. °This information is not intended to replace advice given to you by your health care provider. Make sure you discuss any questions you have with your health care provider. °Document Released: 08/15/2000 Document Revised: 07/31/2017 Document Reviewed: 03/20/2016 °Elsevier Patient Education © 2020 [employer] ° ° °AMBULATORY SURGERY  °DISCHARGE INSTRUCTIONS ° ° °1) The drugs that you were given will stay in your system until tomorrow so for the next 24 hours you should not: ° °A) Drive an automobile °B) Make any legal decisions °C) Drink any alcoholic beverage ° ° °2) You may resume regular meals tomorrow.  Today it is better to start with liquids and gradually work up to solid foods. ° °You may eat anything you prefer, but it is better to start with liquids, then soup and crackers, and gradually work up to solid foods. ° ° °  3) Please notify your doctor immediately if you have any unusual bleeding, trouble breathing, redness and pain at the surgery site, drainage, fever, or pain not relieved by medication. ° ° ° °4) Additional Instructions: ° ° ° ° ° ° ° °Please contact your physician with any problems or Same Day Surgery at [phone], Monday through Friday 6 am to 4 pm, or  at [facility] Main number at [phone]. ° °

## 2019-07-08 NOTE — Transfer of Care (Signed)
Immediate Anesthesia Transfer of Care Note  Patient: Pamela [name]  Procedure(s) Performed: DILATATION AND EVACUATION (N/A Vagina )  Patient Location: PACU  Anesthesia Type:General  Level of Consciousness: sedated  Airway & Oxygen Therapy: Patient Spontanous Breathing and Patient connected to face mask oxygen  Post-op Assessment: Report given to RN and Post -op Vital signs reviewed and stable  Post vital signs: Reviewed  Last Vitals:  Vitals Value Taken Time  BP 98/65 07/08/19 0149  Temp 36.4 C 07/08/19 0149  Pulse 97 07/08/19 0149  Resp 16 07/08/19 0149  SpO2 100 % 07/08/19 0149  Vitals shown include unvalidated device data.  Last Pain:  Vitals:   07/08/19 0013  TempSrc:   PainSc: 0-No pain         Complications: No apparent anesthesia complications

## 2019-07-08 NOTE — Anesthesia Post-op Follow-up Note (Signed)
Anesthesia QCDR form completed.        

## 2019-07-08 NOTE — Anesthesia Procedure Notes (Signed)
Procedure Name: Intubation Performed by: Mohamadou Maciver, CRNA Pre-anesthesia Checklist: Patient identified, Patient being monitored, Timeout performed, Emergency Drugs available and Suction available Patient Re-evaluated:Patient Re-evaluated prior to induction Oxygen Delivery Method: Circle system utilized Preoxygenation: Pre-oxygenation with 100% oxygen Induction Type: IV induction and Rapid sequence Laryngoscope Size: 3 and McGraph Grade View: Grade I Tube type: Oral Tube size: 7.0 mm Number of attempts: 1 Airway Equipment and Method: Stylet and Video-laryngoscopy Placement Confirmation: ETT inserted through vocal cords under direct vision,  positive ETCO2 and breath sounds checked- equal and bilateral Secured at: 21 cm Tube secured with: Tape Dental Injury: Teeth and Oropharynx as per pre-operative assessment        

## 2019-07-08 NOTE — ED Notes (Signed)
[place] at bedside. Pt states she had a negative covid at [facility] yesterday. DR Marcelline Mates and this RN verify in records.

## 2019-07-08 NOTE — Op Note (Signed)
Procedure(s): DILATATION AND EVACUATION Procedure Note  [name] female 33 y.o. 07/08/2019  Indications: The patient is a 33 y.o. [phone] female with current incomplete abortion, s/p failed medical termination  Pre-operative Diagnosis: Incomplete abortion, s/p failed medical termination  Post-operative Diagnosis: Same  Surgeon: Rubie Maid, MD  Assistants:  None   Anesthesia: General endotracheal anesthesia  ASA Class:   Procedure Details: The patient was seen in the Holding Room. The risks, benefits, complications, treatment options, and expected outcomes were discussed with the patient.  The patient concurred with the proposed plan, giving informed consent.  The site of surgery properly noted/marked. The patient was taken to the Operating Room, identified as Pamela [name] and the procedure verified as Procedure(s) (LRB): DILATATION AND EVACUATION (N/A). A Time Out was held and the above information confirmed.  She was then placed under general anesthesia without difficulty. She was placed in the dorsal lithotomy position, and was prepped and draped in a sterile manner.  A straight catheterization was performed. A sterile speculum was inserted into the vagina and the cervix was grasped at the anterior lip using a single-toothed tenaculum.   The cervix was already noted to be approximately 1-1.5 cm dilated and was able to accommodate an 8 mm suction curette, that was gently advanced to the uterine fundus.  The suction device was then activated and the curette was slowly rotated to clear the uterine cavity of products of conception.  A sharp curettage with a serrated curette  was then performed to confirm complete emptying of the uterus. Minimal bleeding was encountered. The cervix was then injected circumferentially with 1% Xylocaine for local anesthetic. The tenaculum was removed along with all instruments  from the  vagina.   The patient was awakened, mobilized and taken to  the recovery room in satisfactory condition.  The procedure was well-tolerated.  The patient will be discharged to home as per PACU criteria.  Routine postoperative instructions were given along with a prescription for analgesics.  She will follow up in the clinic in 1-2 weeks for postoperative evaluation.  Findings: Uterus was midplane and 8-9 week size. Cervical os was 1-1.5 cm dilated on visual inspection. Tissue consistent with products of conception was removed and sent to Pathology.  Estimated Blood Loss:  200 ml      Drains: straight catheterization prior to procedure with 10 ml of clear urine         Total IV Fluids: 400 ml  Specimens: Products of conception.          Implants: None         Complications:  None; patient tolerated the procedure well.         Disposition: PACU - hemodynamically stable.         Condition: stable   Rubie Maid, MD [facility]

## 2019-07-08 NOTE — OR Nursing (Addendum)
Dr. Marcelline Mates states that pt was tested for covid yesterday at [facility] clinic results were negative. Sharmon Leyden Confirmed that if able to visualize results are negative then okay to use from [facility] facility. MD Cherry notified and confirmed COVID results were negative and that she read them herself.    2019 Novel Coronavirus (CoVID-19), NAA - [facility] (07/06/2019 11:25 AM EST) 2019 Novel Coronavirus (CoVID-19), NAA - [facility] (07/06/2019 11:25 AM EST)  Component Value Ref Range Performed At Pathologist Signature  SARS-COV-2, NAA - [facility] Not Detected Comment:  This nucleic acid amplification test was developed and its performance characteristics determined by [employer]. Nucleic acid amplification tests include PCR and TMA. This test has not been FDA cleared or approved. This test has been authorized by FDA under an Emergency Use Authorization (EUA). This test is only authorized for the duration of time the declaration that circumstances exist justifying the authorization of the emergency use of in vitro diagnostic tests for detection of SARS-CoV-2 virus and/or diagnosis of COVID-19 infection under section 564(b)(1) of the Act, 21 U.S.C. 597CBU-3(A) (1), unless the authorization is terminated or revoked sooner. When diagnostic testing is negative, the possibility of a false negative result should be considered in the context of a patient's recent exposures and the presence of clinical signs and symptoms consistent with COVID-19. An individual without symptoms of COVID-19 and who is not shedding SARS-CoV-2 virus would expect to have a negative (not detected) result in this assay. Not Detected [facility]    2019 Novel Coronavirus (CoVID-19), NAA - [facility] (07/06/2019 11:25 AM EST)  Specimen  Throat swab (specimen)   2019 Novel Coronavirus (CoVID-19), NAA - [facility] (07/06/2019 11:25 AM EST)  Narrative Performed At  Performed at: [address]  [address]  [place], [place] [id number]  Lab Director: Rush Farmer MD, Phone: [phone]  Franchot Mimes

## 2019-07-11 LAB — SURGICAL PATHOLOGY

## 2019-07-11 NOTE — Anesthesia Postprocedure Evaluation (Signed)
Anesthesia Post Note  Patient: Pamela [name]  Procedure(s) Performed: DILATATION AND EVACUATION (N/A Vagina )  Patient location during evaluation: PACU Anesthesia Type: General Level of consciousness: awake and alert Pain management: pain level controlled Vital Signs Assessment: post-procedure vital signs reviewed and stable Respiratory status: spontaneous breathing, nonlabored ventilation and respiratory function stable Cardiovascular status: blood pressure returned to baseline and stable Postop Assessment: no apparent nausea or vomiting Anesthetic complications: no     Last Vitals:  Vitals:   07/08/19 0234 07/08/19 0255  BP: 109/82 114/75  Pulse: 92 98  Resp: 17 18  Temp: 36.7 C   SpO2: 100% 100%    Last Pain:  Vitals:   07/08/19 0255  TempSrc:   PainSc: 0-No pain                 Durenda Hurt

## 2019-07-15 ENCOUNTER — Encounter: Payer: Self-pay | Admitting: Obstetrics and Gynecology

## 2019-07-15 ENCOUNTER — Other Ambulatory Visit: Payer: Self-pay

## 2019-07-15 ENCOUNTER — Ambulatory Visit (INDEPENDENT_AMBULATORY_CARE_PROVIDER_SITE_OTHER): Payer: Medicaid Other | Admitting: Obstetrics and Gynecology

## 2019-07-15 VITALS — BP 102/65 | HR 70 | Ht 68.0 in | Wt 147.9 lb

## 2019-07-15 DIAGNOSIS — Z3042 Encounter for surveillance of injectable contraceptive: Secondary | ICD-10-CM | POA: Diagnosis not present

## 2019-07-15 DIAGNOSIS — Z9889 Other specified postprocedural states: Secondary | ICD-10-CM

## 2019-07-15 DIAGNOSIS — Z4889 Encounter for other specified surgical aftercare: Secondary | ICD-10-CM

## 2019-07-15 DIAGNOSIS — Z8759 Personal history of other complications of pregnancy, childbirth and the puerperium: Secondary | ICD-10-CM

## 2019-07-15 MED ORDER — MEDROXYPROGESTERONE ACETATE 150 MG/ML IM SUSP
150.0000 mg | Freq: Once | INTRAMUSCULAR | Status: AC
  Administered 2019-07-15: 10:00:00 150 mg via INTRAMUSCULAR

## 2019-07-15 NOTE — Progress Notes (Signed)
    OBSTETRICS/GYNECOLOGY POST-OPERATIVE CLINIC VISIT  Subjective:     Pamela [name] is a 33 y.o. female who presents to the clinic 1 weeks status post suction D&C for incomplete miscarriage. Had a history of attempted medical termination with Cytotec 2 weeks prior (at [facility] in [place]).  Eating a regular diet without difficulty. Bowel movements are normal. The patient is not having any pain. Reports bleeding is minimal, denies passage of clots.   The following portions of the patient's history were reviewed and updated as appropriate: allergies, current medications, past family history, past medical history, past social history, past surgical history and problem list.  Review of Systems Pertinent items noted in HPI and remainder of comprehensive ROS otherwise negative.    Objective:    BP 102/65   Pulse 70   Ht 5\' 8"  (1.727 m)   Wt 147 lb 14.4 oz (67.1 kg)   BMI 22.49 kg/m  General:  alert and no distress  Abdomen: soft, bowel sounds active, non-tender  Pelvis:   external genitalia normal, rectovaginal septum normal.  Vagina without discharge, scant dark red blood in vaginal vault.  Cervix normal appearing, no lesions and no motion tenderness.  Bimanual exam not performed.     Pathology:   A. PRODUCTS OF CONCEPTION; DILATATION AND EVACUATION:  - PREDOMINANTLY BLOOD CLOT WITH RARE FRAGMENTS OF DEGENERATING DECIDUA  AND ENDOMETRIUM.  - CHORIONIC VILLI ARE NOT IDENTIFIED.   B. TISSUE FROM PRODUCTS OF CONCEPTION; DILATATION AND EVACUATION:  - DEGENERATING CHORIONIC VILLI AND EMBRYONIC SOMATIC TISSUE, CONSISTENT  WITH PRODUCTS OF CONCEPTION.   Assessment:    Doing well postoperatively.  S/p Dilation and curettage  Plan:   1. Doing well. Will check hormone levels (BHCG) to follow trend.  2. Operative findings again reviewed. Pathology report discussed. 3. Activity restrictions: none 4. Anticipated return to work: patient has already returned to work. 5.  Discussed contraceptive options with patient as recent pregnancy was unplanned and undesired. Patient would like to resume Depo Provera injections. Injection given today.  6. Follow up: 3 months with Dani Gobble, CNM for annual exam and next Depo Provera injection.     Rubie Maid, MD [facility]

## 2019-07-15 NOTE — Progress Notes (Signed)
Pt present for follow up visit after d/c. Pt stated that she was doing well and denies any issues at this time. Pt was given depo injection today.

## 2019-07-15 NOTE — Patient Instructions (Signed)
Medroxyprogesterone injection [Contraceptive] What is this medicine? MEDROXYPROGESTERONE (me DROX ee proe JES te rone) contraceptive injections prevent pregnancy. They provide effective birth control for 3 months. Depo-subQ Provera 104 is also used for treating pain related to endometriosis. This medicine may be used for other purposes; ask your health care provider or pharmacist if you have questions. COMMON BRAND NAME(S): Depo-Provera, Depo-subQ Provera 104 What should I tell my health care provider before I take this medicine? They need to know if you have any of these conditions:  frequently drink alcohol  asthma  blood vessel disease or a history of a blood clot in the lungs or legs  bone disease such as osteoporosis  breast cancer  diabetes  eating disorder (anorexia nervosa or bulimia)  high blood pressure  HIV infection or AIDS  kidney disease  liver disease  mental depression  migraine  seizures (convulsions)  stroke  tobacco smoker  vaginal bleeding  an unusual or allergic reaction to medroxyprogesterone, other hormones, medicines, foods, dyes, or preservatives  pregnant or trying to get pregnant  breast-feeding How should I use this medicine? Depo-Provera Contraceptive injection is given into a muscle. Depo-subQ Provera 104 injection is given under the skin. These injections are given by a health care professional. You must not be pregnant before getting an injection. The injection is usually given during the first 5 days after the start of a menstrual period or 6 weeks after delivery of a baby. Talk to your pediatrician regarding the use of this medicine in children. Special care may be needed. These injections have been used in female children who have started having menstrual periods. Overdosage: If you think you have taken too much of this medicine contact a poison control center or emergency room at once. NOTE: This medicine is only for you. Do not  share this medicine with others. What if I miss a dose? Try not to miss a dose. You must get an injection once every 3 months to maintain birth control. If you cannot keep an appointment, call and reschedule it. If you wait longer than 13 weeks between Depo-Provera contraceptive injections or longer than 14 weeks between Depo-subQ Provera 104 injections, you could get pregnant. Use another method for birth control if you miss your appointment. You may also need a pregnancy test before receiving another injection. What may interact with this medicine? Do not take this medicine with any of the following medications:  bosentan This medicine may also interact with the following medications:  aminoglutethimide  antibiotics or medicines for infections, especially rifampin, rifabutin, rifapentine, and griseofulvin  aprepitant  barbiturate medicines such as phenobarbital or primidone  bexarotene  carbamazepine  medicines for seizures like ethotoin, felbamate, oxcarbazepine, phenytoin, topiramate  modafinil  St. John's wort This list may not describe all possible interactions. Give your health care provider a list of all the medicines, herbs, non-prescription drugs, or dietary supplements you use. Also tell them if you smoke, drink alcohol, or use illegal drugs. Some items may interact with your medicine. What should I watch for while using this medicine? This drug does not protect you against HIV infection (AIDS) or other sexually transmitted diseases. Use of this product may cause you to lose calcium from your bones. Loss of calcium may cause weak bones (osteoporosis). Only use this product for more than 2 years if other forms of birth control are not right for you. The longer you use this product for birth control the more likely you will be at risk   for weak bones. Ask your health care professional how you can keep strong bones. You may have a change in bleeding pattern or irregular periods.  Many females stop having periods while taking this drug. If you have received your injections on time, your chance of being pregnant is very low. If you think you may be pregnant, see your health care professional as soon as possible. Tell your health care professional if you want to get pregnant within the next year. The effect of this medicine may last a long time after you get your last injection. What side effects may I notice from receiving this medicine? Side effects that you should report to your doctor or health care professional as soon as possible:  allergic reactions like skin rash, itching or hives, swelling of the face, lips, or tongue  breast tenderness or discharge  breathing problems  changes in vision  depression  feeling faint or lightheaded, falls  fever  pain in the abdomen, chest, groin, or leg  problems with balance, talking, walking  unusually weak or tired  yellowing of the eyes or skin Side effects that usually do not require medical attention (report to your doctor or health care professional if they continue or are bothersome):  acne  fluid retention and swelling  headache  irregular periods, spotting, or absent periods  temporary pain, itching, or skin reaction at site where injected  weight gain This list may not describe all possible side effects. Call your doctor for medical advice about side effects. You may report side effects to FDA at 1-800-FDA-1088. Where should I keep my medicine? This does not apply. The injection will be given to you by a health care professional. NOTE: This sheet is a summary. It may not cover all possible information. If you have questions about this medicine, talk to your doctor, pharmacist, or health care provider.  2020 Elsevier/Gold Standard (2008-09-08 18:37:56)  

## 2019-07-16 LAB — BETA HCG QUANT (REF LAB): hCG Quant: 767 m[IU]/mL

## 2019-10-04 ENCOUNTER — Ambulatory Visit: Payer: Medicaid Other | Attending: Internal Medicine

## 2019-10-04 DIAGNOSIS — Z20822 Contact with and (suspected) exposure to covid-19: Secondary | ICD-10-CM | POA: Diagnosis not present

## 2019-10-05 LAB — NOVEL CORONAVIRUS, NAA: SARS-CoV-2, NAA: NOT DETECTED

## 2019-10-07 ENCOUNTER — Ambulatory Visit: Payer: Medicaid Other | Attending: Internal Medicine

## 2019-10-07 DIAGNOSIS — Z20822 Contact with and (suspected) exposure to covid-19: Secondary | ICD-10-CM | POA: Diagnosis not present

## 2019-10-08 LAB — NOVEL CORONAVIRUS, NAA: SARS-CoV-2, NAA: DETECTED — AB

## 2019-10-22 NOTE — Progress Notes (Signed)
ANNUAL PREVENTATIVE CARE GYN  ENCOUNTER NOTE  Subjective:       Pamela [name] is a 34 y.o. [phone] female here for a routine annual gynecologic exam.  Current complaints: 1. Needs Pap smear 2. Depo provera injection due  Denies difficulty breathing or respiratory distress, chest pain, abdominal pain, excessive vaginal bleeding, dysuria, and leg pain or swelling.    Gynecologic History  No LMP recorded (lmp unknown). Patient has had an injection.  Contraception: Depo-Provera injections; next due 05/13-27, 2021.  Last Pap: 2018. Results were: normal  Obstetric History  OB History  Gravida Para Term Preterm AB Living  5 2 2  0 3 2  SAB TAB Ectopic Multiple Live Births  2 1 0 0 2    # Outcome Date GA Lbr Len/2nd Weight Sex Delivery Anes PTL Lv  5 SAB 07/2019     SAB     4 Term 07/22/17 [redacted]w[redacted]d  9 lb 11.2 oz (4.4 kg) F CS-LTranv Spinal  LIV  3 SAB 08/07/15 [redacted]w[redacted]d       ND  2 Term 12/01/08 [redacted]w[redacted]d  8 lb 11 oz (3.941 kg) F CS-LTranv EPI  LIV  1 TAB 2008        ND    Obstetric Comments  2016 [facility])    Past Medical History:  Diagnosis Date  . Headache   . History of recurrent UTIs   . Panic attacks   . Umbilical hernia   . Umbilical hernia     Past Surgical History:  Procedure Laterality Date  . CESAREAN SECTION  12/01/2008  . CESAREAN SECTION N/A 07/22/2017   Procedure: REPEAT CESAREAN SECTION (Female, 0948, 9lbs 11oz);  Surgeon: 07/24/2017, MD;  Location: [facility] ORS;  Service: Obstetrics;  Laterality: N/A;  . DILATION AND CURETTAGE OF UTERUS    . DILATION AND EVACUATION N/A 08/07/2015   Procedure: DILATATION AND EVACUATION;  Surgeon: 14/01/2015, MD;  Location: [facility] ORS;  Service: Gynecology;  Laterality: N/A;  . DILATION AND EVACUATION N/A 07/08/2019   Procedure: DILATATION AND EVACUATION;  Surgeon: 13/01/2019, MD;  Location: [facility] ORS;  Service: Gynecology;  Laterality: N/A;    Current Outpatient Medications on File Prior to Visit  Medication Sig Dispense Refill   . ibuprofen (ADVIL) 800 MG tablet Take 1 tablet (800 mg total) by mouth every 8 (eight) hours as needed for mild pain, moderate pain or cramping. 60 tablet 1  . medroxyPROGESTERone (DEPO-PROVERA) 150 MG/ML injection Inject 1 mL (150 mg total) into the muscle once for 1 dose. 1 mL 3   No current facility-administered medications on file prior to visit.    Allergies  Allergen Reactions  . Tramadol Itching    Social History   Socioeconomic History  . Marital status: Single    Spouse name: Not on file  . Number of children: Not on file  . Years of education: Not on file  . Highest education level: Not on file  Occupational History  . Not on file  Tobacco Use  . Smoking status: Never Smoker  . Smokeless tobacco: Never Used  Substance and Sexual Activity  . Alcohol use: Yes    Comment: rare  . Drug use: No  . Sexual activity: Yes    Birth control/protection: Injection  Other Topics Concern  . Not on file  Social History Narrative  . Not on file   Social Determinants of Health   Financial Resource Strain:   . Difficulty of Paying Living Expenses: Not  on file  Food Insecurity:   . Worried About Programme researcher, broadcasting/film/video in the Last Year: Not on file  . Ran Out of Food in the Last Year: Not on file  Transportation Needs:   . Lack of Transportation (Medical): Not on file  . Lack of Transportation (Non-Medical): Not on file  Physical Activity:   . Days of Exercise per Week: Not on file  . Minutes of Exercise per Session: Not on file  Stress:   . Feeling of Stress : Not on file  Social Connections:   . Frequency of Communication with Friends and Family: Not on file  . Frequency of Social Gatherings with Friends and Family: Not on file  . Attends Religious Services: Not on file  . Active Member of Clubs or Organizations: Not on file  . Attends Banker Meetings: Not on file  . Marital Status: Not on file  Intimate Partner Violence: Not At Risk  . Fear of Current  or Ex-Partner: No  . Emotionally Abused: No  . Physically Abused: No  . Sexually Abused: No    Family History  Problem Relation Age of Onset  . Healthy Mother   . Hypertension Father   . Alcohol abuse Neg Hx   . Arthritis Neg Hx   . Asthma Neg Hx   . Birth defects Neg Hx   . Cancer Neg Hx   . COPD Neg Hx   . Depression Neg Hx   . Diabetes Neg Hx   . Drug abuse Neg Hx   . Early death Neg Hx   . Hearing loss Neg Hx   . Heart disease Neg Hx   . Hyperlipidemia Neg Hx   . Kidney disease Neg Hx   . Learning disabilities Neg Hx   . Mental illness Neg Hx   . Mental retardation Neg Hx   . Miscarriages / Stillbirths Neg Hx   . Stroke Neg Hx   . Vision loss Neg Hx   . Varicose Veins Neg Hx     The following portions of the patient's history were reviewed and updated as appropriate: allergies, current medications, past family history, past medical history, past social history, past surgical history and problem list.  Review of Systems  ROS negative except as noted above. Information obtained from patient.    Objective:   BP (!) 111/57   Pulse 79   Ht 5\' 8"  (1.727 m)   Wt 162 lb 11.2 oz (73.8 kg)   LMP  (LMP Unknown)   Breastfeeding No   BMI 24.74 kg/m    CONSTITUTIONAL: Well-developed, well-nourished female in no acute distress.   PSYCHIATRIC: Normal mood and affect. Normal behavior. Normal judgment and thought content.  NEUROLGIC: Alert and oriented to person, place, and time. Normal muscle tone coordination. No cranial nerve deficit noted.  HENT:  Normocephalic, atraumatic, External right and left ear normal.  EYES: Conjunctivae and EOM are normal. Pupils are equal and round.   NECK: Normal range of motion, supple, no masses.  Normal thyroid.   SKIN: Skin is warm and dry. No rash noted. Not diaphoretic. No erythema. No pallor. Tattoos present.   CARDIOVASCULAR: Normal heart rate noted, regular rhythm, no murmur.  RESPIRATORY: Clear to auscultation bilaterally.  Effort and breath sounds normal, no problems with respiration noted.  BREASTS: Symmetric in size. No masses, skin changes, nipple drainage, or lymphadenopathy.  ABDOMEN: Soft, normal bowel sounds, no distention noted.  No tenderness, rebound or guarding. Umbilical hernia present.  PELVIC:  External Genitalia: Normal  Vagina: Normal  Cervix: Normal, Pap collected  Uterus: Normal  Adnexa: Normal    MUSCULOSKELETAL: Normal range of motion. No tenderness.  No cyanosis, clubbing, or edema.  2+ distal pulses.  LYMPHATIC: No Axillary, Supraclavicular, or Inguinal Adenopathy.  Assessment:   Annual gynecologic examination 34 y.o.   Contraception: Depo-Provera injections   Normal BMI   Problem List Items Addressed This Visit      Other   Umbilical hernia   Relevant Orders   Ambulatory referral to General Surgery    Other Visit Diagnoses    Well woman exam    -  Primary   Relevant Orders   Cytology - PAP   Ambulatory referral to General Surgery   Depot contraception       Screening for cervical cancer       Relevant Orders   Cytology - PAP   Encounter for management and injection of depo-Provera       Relevant Orders   POCT urine pregnancy (Completed)      Plan:   Pap: Pap Co Test   Depo provera injection given today, see chart  Labs: Declined  Routine preventative health maintenance measures emphasized: Exercise/Diet/Weight control, Tobacco Warnings, Alcohol/Substance use risks and Stress Management; see AVS  Reviewed red flag symptoms and when to call  RTC x 3 months for Depo provera injection  RTC x 1 year for ANNUAL EXAM or sooner if needed   Diona Fanti, CNM [facility], [facility] 10/28/19 1:36 AM    Diona Fanti, CNM [facility], [facility] 10/27/19 9:43 AM

## 2019-10-26 ENCOUNTER — Telehealth: Payer: Self-pay | Admitting: Certified Nurse Midwife

## 2019-10-26 ENCOUNTER — Other Ambulatory Visit: Payer: Self-pay

## 2019-10-26 MED ORDER — MEDROXYPROGESTERONE ACETATE 150 MG/ML IM SUSP: 150.0000 mg | Freq: Once | INTRAMUSCULAR | 3 refills | Status: DC

## 2019-10-26 MED ORDER — MEDROXYPROGESTERONE ACETATE 150 MG/ML IM SUSP: 150.0000 mg | Freq: Once | INTRAMUSCULAR | Status: DC

## 2019-10-26 NOTE — Telephone Encounter (Signed)
Prescription sent.  Called and spoke with patient, patient aware and verbalized understanding.

## 2019-10-26 NOTE — Telephone Encounter (Signed)
Pt called in and stated that she need sher refill of her depo sent to the pharmacy ( [facility] in [place]). The pt has an appt tomorrow. Please advise

## 2019-10-27 ENCOUNTER — Ambulatory Visit (INDEPENDENT_AMBULATORY_CARE_PROVIDER_SITE_OTHER): Payer: Medicaid Other | Admitting: Certified Nurse Midwife

## 2019-10-27 ENCOUNTER — Other Ambulatory Visit (HOSPITAL_COMMUNITY)
Admission: RE | Admit: 2019-10-27 | Discharge: 2019-10-27 | Disposition: A | Discharge status: Home / Self Care | Payer: Medicaid Other | Source: Ambulatory Visit | Attending: Certified Nurse Midwife | Admitting: Certified Nurse Midwife

## 2019-10-27 ENCOUNTER — Other Ambulatory Visit: Payer: Self-pay

## 2019-10-27 ENCOUNTER — Encounter: Payer: Self-pay | Admitting: Certified Nurse Midwife

## 2019-10-27 VITALS — BP 111/57 | HR 79 | Ht 68.0 in | Wt 162.7 lb

## 2019-10-27 DIAGNOSIS — Z124 Encounter for screening for malignant neoplasm of cervix: Secondary | ICD-10-CM | POA: Insufficient documentation

## 2019-10-27 DIAGNOSIS — Z3042 Encounter for surveillance of injectable contraceptive: Secondary | ICD-10-CM

## 2019-10-27 DIAGNOSIS — Z3202 Encounter for pregnancy test, result negative: Secondary | ICD-10-CM | POA: Diagnosis not present

## 2019-10-27 DIAGNOSIS — Z01419 Encounter for gynecological examination (general) (routine) without abnormal findings: Secondary | ICD-10-CM | POA: Diagnosis not present

## 2019-10-27 DIAGNOSIS — K429 Umbilical hernia without obstruction or gangrene: Secondary | ICD-10-CM

## 2019-10-27 LAB — POCT URINE PREGNANCY: Preg Test, Ur: NEGATIVE

## 2019-10-27 MED ORDER — MEDROXYPROGESTERONE ACETATE 150 MG/ML IM SUSP
150.0000 mg | Freq: Once | INTRAMUSCULAR | Status: AC
  Administered 2019-10-27: 150 mg via INTRAMUSCULAR

## 2019-10-27 NOTE — Progress Notes (Signed)
Patient here for annual exam.  Patient wanting to discuss umbilical hernia removal, c/o occasional discomfort.    Date last pap: 12/29/2016 (Negative). Last Depo-Provera: 07/15/2019. Side Effects if any: None per patient. Urine HCG indicated? Negative. Depo-Provera 150 mg IM given by: Park Meo, CMA. Next appointment due 5/13-5/27/2021.

## 2019-10-27 NOTE — Patient Instructions (Addendum)
Hernia, Adult     A hernia happens when tissue inside your body pushes out through a weak spot in your belly muscles (abdominal wall). This makes a round lump (bulge). The lump may be:  In a scar from surgery that was done in your belly (incisional hernia).  Near your belly button (umbilical hernia).  In your groin (inguinal hernia). Your groin is the area where your leg meets your lower belly (abdomen). This kind of hernia could also be: ? In your scrotum, if you are female. ? In folds of skin around your vagina, if you are female.  In your upper thigh (femoral hernia).  Inside your belly (hiatal hernia). This happens when your stomach slides above the muscle between your belly and your chest (diaphragm). If your hernia is small and it does not cause pain, you may not need treatment. If your hernia is large or it causes pain, you may need surgery. Follow these instructions at home: Activity  Avoid stretching or overusing (straining) the muscles near your hernia. Straining can happen when you: ? Lift something heavy. ? Poop (have a bowel movement).  Do not lift anything that is heavier than 10 lb (4.5 kg), or the limit that you are told, until your doctor says that it is safe.  Use the strength of your legs when you lift something heavy. Do not use only your back muscles to lift. General instructions  Do these things if told by your doctor so you do not have trouble pooping (constipation): ? Drink enough fluid to keep your pee (urine) pale yellow. ? Eat foods that are high in fiber. These include fresh fruits and vegetables, whole grains, and beans. ? Limit foods that are high in fat and processed sugars. These include foods that are fried or sweet. ? Take medicine for trouble pooping.  When you cough, try to cough gently.  You may try to push your hernia in by very gently pressing on it when you are lying down. Do not try to force the bulge back in if it will not push in  easily.  If you are overweight, work with your doctor to lose weight safely.  Do not use any products that have nicotine or tobacco in them. These include cigarettes and e-cigarettes. If you need help quitting, ask your doctor.  If you will be having surgery (hernia repair), watch your hernia for changes in shape, size, or color. Tell your doctor if you see any changes.  Take over-the-counter and prescription medicines only as told by your doctor.  Keep all follow-up visits as told by your doctor. Contact a doctor if:  You get new pain, swelling, or redness near your hernia.  You poop fewer times in a week than normal.  You have trouble pooping.  You have poop (stool) that is more dry than normal.  You have poop that is harder or larger than normal. Get help right away if:  You have a fever.  You have belly pain that gets worse.  You feel sick to your stomach (nauseous).  You throw up (vomit).  Your hernia cannot be pushed in by very gently pressing on it when you are lying down. Do not try to force the bulge back in if it will not push in easily.  Your hernia: ? Changes in shape or size. ? Changes color. ? Feels hard or it hurts when you touch it. These symptoms may represent a serious problem that is an emergency. Do not  wait to see if the symptoms will go away. Get medical help right away. Call your local emergency services (911 in the [place] Summary  A hernia happens when tissue inside your body pushes out through a weak spot in the belly muscles. This creates a bulge.  If your hernia is small and it does not hurt, you may not need treatment. If your hernia is large or it hurts, you may need surgery.  If you will be having surgery, watch your hernia for changes in shape, size, or color. Tell your doctor about any changes. This information is not intended to replace advice given to you by your health care provider. Make sure you discuss any questions you have with  your health care provider. Document Revised: 12/09/2018 Document Reviewed: 05/20/2017 Elsevier Patient Education  2020 [employer]. Contraceptive Injection A contraceptive injection is a shot that prevents pregnancy. It is also called the birth control shot. The shot contains the hormone progestin, which prevents pregnancy by:  Stopping the ovaries from releasing eggs.  Thickening cervical mucus to prevent sperm from entering the cervix.  Thinning the lining of the uterus to prevent a fertilized egg from attaching to the uterus. Contraceptive injections are given under the skin (subcutaneous) or into a muscle (intramuscular). For these shots to work, you must get one of them every 3 months (12 weeks) from a health care provider. Tell a health care provider about:  Any allergies you have.  All medicines you are taking, including vitamins, herbs, eye drops, creams, and over-the-counter medicines.  Any blood disorders you have.  Any medical conditions you have.  Whether you are pregnant or may be pregnant. What are the risks? Generally, this is a safe procedure. However, problems may occur, including:  Mood changes or depression.  Loss of bone density (osteoporosis) after long-term use.  Blood clots.  Higher risk of an egg being fertilized outside your uterus (ectopic pregnancy).This is rare. What happens before the procedure?  Your health care provider may do a routine physical exam.  You may have a test to make sure you are not pregnant. What happens during the procedure?  The area where the shot will be given will be cleaned and sanitized with alcohol.  A needle will be inserted into a muscle in your upper arm or buttock, or into the skin of your thigh or abdomen. The needle will be attached to a syringe with the medicine inside of it.  The medicine will be pushed through the syringe and injected into your body.  A small bandage (dressing) may be placed over the  injection site. What can I expect after the procedure?  After the procedure, it is common to have: ? Soreness around the injection site for a couple of days. ? Irregular menstrual bleeding. ? Weight gain. ? Breast tenderness. ? Headaches. ? Discomfort in your abdomen.  Ask your health care provider whether you need to use an added method of birth control (backup contraception), such as a condom, sponge, or spermicide. ? If the first shot is given 1-7 days after the start of your last period, you will not need backup contraception. ? If the first shot is given at any other time during your menstrual cycle, you should avoid having sex or you will need backup contraception for 7 days after you receive the shot. Follow these instructions at home: General instructions   Take over-the-counter and prescription medicines only as told by your health care provider.  Do not massage  the injection site.  Track your menstrual periods so you will know if they become irregular.  Always use a condom to protect against STIs (sexually transmitted infections).  Make sure you schedule an appointment in time for your next shot, and mark it on your calendar. For the birth control to prevent pregnancy, you must get the injections every 3 months (12 weeks). Lifestyle  Do not use any products that contain nicotine or tobacco, such as cigarettes and e-cigarettes. If you need help quitting, ask your health care provider.  Eat foods that are high in calcium and vitamin D, such as milk, cheese, and salmon. Doing this may help with any loss in bone density that is caused by the contraceptive injection. Ask your health care provider for dietary recommendations. Contact a health care provider if:  You have nausea or vomiting.  You have abnormal vaginal discharge or bleeding.  You miss a period or you think you might be pregnant.  You experience mood changes or depression.  You feel dizzy or  light-headed.  You have leg pain. Get help right away if:  You have chest pain.  You cough up blood.  You have shortness of breath.  You have a severe headache that does not go away.  You have numbness in any part of your body.  You have slurred speech.  You have vision problems.  You have vaginal bleeding that is abnormally heavy or does not stop.  You have severe pain in your abdomen.  You have depression that does not get better with treatment. If you ever feel like you may hurt yourself or others, or have thoughts about taking your own life, get help right away. You can go to your nearest emergency department or call:  Your local emergency services (911 in the [place]  A suicide crisis helpline, such as the [facility] at 9034415797. This is open 24 hours a day. Summary  A contraceptive injection is a shot that prevents pregnancy. It is also called the birth control shot.  The shot is given under the skin (subcutaneous) or into a muscle (intramuscular).  After this procedure, it is common to have soreness around the injection site for a couple of days.  To prevent pregnancy, the shot must be given by a health care provider every 3 months (12 weeks).  After you have the shot, ask your health care provider whether you need to use an added method of birth control (backup contraception), such as a condom, sponge, or spermicide. This information is not intended to replace advice given to you by your health care provider. Make sure you discuss any questions you have with your health care provider. Document Revised: 07/31/2017 Document Reviewed: 04/13/2017 Elsevier Patient Education  2020 [employer] 59-24 Years Old, Female Preventive care refers to visits with your health care provider and lifestyle choices that can promote health and wellness. This includes:  A yearly physical exam. This may also be called an annual well  check.  Regular dental visits and eye exams.  Immunizations.  Screening for certain conditions.  Healthy lifestyle choices, such as eating a healthy diet, getting regular exercise, not using drugs or products that contain nicotine and tobacco, and limiting alcohol use. What can I expect for my preventive care visit? Physical exam Your health care provider will check your:  Height and weight. This may be used to calculate body mass index (BMI), which tells if you are at a healthy weight.  Heart rate and blood pressure.  Skin for abnormal spots. Counseling Your health care provider may ask you questions about your:  Alcohol, tobacco, and drug use.  Emotional well-being.  Home and relationship well-being.  Sexual activity.  Eating habits.  Work and work Statistician.  Method of birth control.  Menstrual cycle.  Pregnancy history. What immunizations do I need?  Influenza (flu) vaccine  This is recommended every year. Tetanus, diphtheria, and pertussis (Tdap) vaccine  You may need a Td booster every 10 years. Varicella (chickenpox) vaccine  You may need this if you have not been vaccinated. Human papillomavirus (HPV) vaccine  If recommended by your health care provider, you may need three doses over 6 months. Measles, mumps, and rubella (MMR) vaccine  You may need at least one dose of MMR. You may also need a second dose. Meningococcal conjugate (MenACWY) vaccine  One dose is recommended if you are age 80-21 years and a first-year college student living in a residence hall, or if you have one of several medical conditions. You may also need additional booster doses. Pneumococcal conjugate (PCV13) vaccine  You may need this if you have certain conditions and were not previously vaccinated. Pneumococcal polysaccharide (PPSV23) vaccine  You may need one or two doses if you smoke cigarettes or if you have certain conditions. Hepatitis A vaccine  You may need  this if you have certain conditions or if you travel or work in places where you may be exposed to hepatitis A. Hepatitis B vaccine  You may need this if you have certain conditions or if you travel or work in places where you may be exposed to hepatitis B. Haemophilus influenzae type b (Hib) vaccine  You may need this if you have certain conditions. You may receive vaccines as individual doses or as more than one vaccine together in one shot (combination vaccines). Talk with your health care provider about the risks and benefits of combination vaccines. What tests do I need?  Blood tests  Lipid and cholesterol levels. These may be checked every 5 years starting at age 37.  Hepatitis C test.  Hepatitis B test. Screening  Diabetes screening. This is done by checking your blood sugar (glucose) after you have not eaten for a while (fasting).  Sexually transmitted disease (STD) testing.  BRCA-related cancer screening. This may be done if you have a family history of breast, ovarian, tubal, or peritoneal cancers.  Pelvic exam and Pap test. This may be done every 3 years starting at age 30. Starting at age 81, this may be done every 5 years if you have a Pap test in combination with an HPV test. Talk with your health care provider about your test results, treatment options, and if necessary, the need for more tests. Follow these instructions at home: Eating and drinking   Eat a diet that includes fresh fruits and vegetables, whole grains, lean protein, and low-fat dairy.  Take vitamin and mineral supplements as recommended by your health care provider.  Do not drink alcohol if: ? Your health care provider tells you not to drink. ? You are pregnant, may be pregnant, or are planning to become pregnant.  If you drink alcohol: ? Limit how much you have to 0-1 drink a day. ? Be aware of how much alcohol is in your drink. In the [place] one drink equals one 12 oz bottle of beer (355 mL),  one 5 oz glass of wine (148 mL), or one 1 oz glass of  hard liquor (44 mL). Lifestyle  Take daily care of your teeth and gums.  Stay active. Exercise for at least 30 minutes on 5 or more days each week.  Do not use any products that contain nicotine or tobacco, such as cigarettes, e-cigarettes, and chewing tobacco. If you need help quitting, ask your health care provider.  If you are sexually active, practice safe sex. Use a condom or other form of birth control (contraception) in order to prevent pregnancy and STIs (sexually transmitted infections). If you plan to become pregnant, see your health care provider for a preconception visit. What's next?  Visit your health care provider once a year for a well check visit.  Ask your health care provider how often you should have your eyes and teeth checked.  Stay up to date on all vaccines. This information is not intended to replace advice given to you by your health care provider. Make sure you discuss any questions you have with your health care provider. Document Revised: 04/29/2018 Document Reviewed: 04/29/2018 Elsevier Patient Education  2020 [employer].

## 2019-10-28 LAB — CYTOLOGY - PAP
Adequacy: ABSENT
Comment: NEGATIVE
Diagnosis: NEGATIVE
High risk HPV: NEGATIVE

## 2019-11-01 ENCOUNTER — Encounter: Payer: Self-pay | Admitting: Surgery

## 2019-11-01 ENCOUNTER — Other Ambulatory Visit: Payer: Self-pay

## 2019-11-01 ENCOUNTER — Ambulatory Visit (INDEPENDENT_AMBULATORY_CARE_PROVIDER_SITE_OTHER): Payer: Medicaid Other | Admitting: Surgery

## 2019-11-01 VITALS — BP 122/78 | HR 82 | Temp 97.5°F | Ht 68.0 in | Wt 162.8 lb

## 2019-11-01 DIAGNOSIS — K429 Umbilical hernia without obstruction or gangrene: Secondary | ICD-10-CM

## 2019-11-01 NOTE — Progress Notes (Signed)
Patient ID: Pamela [name], female   DOB: 01-Jan-1986, 34 y.o.   MRN: [id number]  Chief Complaint: Bulging at umbilical area  History of Present Illness Pamela [name] is a 34 y.o. female with suspected umbilical hernia.  She reports prior history of pain, but denies any pain recently.  She reports she has been avid at her workouts, and is concerned primarily about the bulging appearance.  She denies any history of constipation or diarrhea.  She does note some ill feeling/nausea with certain fragrances.  She reports the bulge has been present for 8 years.  She denies any pain with sit ups, abdominal crunches, sneezing or coughing.  Past Medical History Past Medical History:  Diagnosis Date  . Headache   . History of recurrent UTIs   . Panic attacks   . Umbilical hernia   . Umbilical hernia       Past Surgical History:  Procedure Laterality Date  . CESAREAN SECTION  12/01/2008  . CESAREAN SECTION N/A 07/22/2017   Procedure: REPEAT CESAREAN SECTION (Female, 0948, 9lbs 11oz);  Surgeon: Harlin Heys, MD;  Location: [facility];  Service: Obstetrics;  Laterality: N/A;  . DILATION AND CURETTAGE OF UTERUS    . DILATION AND EVACUATION N/A 08/07/2015   Procedure: DILATATION AND EVACUATION;  Surgeon: Emily Filbert, MD;  Location: [facility];  Service: Gynecology;  Laterality: N/A;  . DILATION AND EVACUATION N/A 07/08/2019   Procedure: DILATATION AND EVACUATION;  Surgeon: Rubie Maid, MD;  Location: [facility];  Service: Gynecology;  Laterality: N/A;    Allergies  Allergen Reactions  . Tramadol Itching    Current Outpatient Medications  Medication Sig Dispense Refill  . ibuprofen (ADVIL) 800 MG tablet Take 1 tablet (800 mg total) by mouth every 8 (eight) hours as needed for mild pain, moderate pain or cramping. 60 tablet 1  . medroxyPROGESTERone (DEPO-PROVERA) 150 MG/ML injection Inject 1 mL (150 mg total) into the muscle once for 1 dose. 1 mL 3   No current facility-administered  medications for this visit.    Family History Family History  Problem Relation Age of Onset  . Healthy Mother   . Healthy Father   . Alcohol abuse Neg Hx   . Arthritis Neg Hx   . Asthma Neg Hx   . Birth defects Neg Hx   . Cancer Neg Hx   . COPD Neg Hx   . Depression Neg Hx   . Diabetes Neg Hx   . Drug abuse Neg Hx   . Early death Neg Hx   . Hearing loss Neg Hx   . Heart disease Neg Hx   . Hyperlipidemia Neg Hx   . Kidney disease Neg Hx   . Learning disabilities Neg Hx   . Mental illness Neg Hx   . Mental retardation Neg Hx   . Miscarriages / Stillbirths Neg Hx   . Stroke Neg Hx   . Vision loss Neg Hx   . Varicose Veins Neg Hx       Social History Social History   Tobacco Use  . Smoking status: Never Smoker  . Smokeless tobacco: Never Used  Substance Use Topics  . Alcohol use: Yes    Comment: rare  . Drug use: No        Review of Systems  Constitutional: Negative.   HENT: Negative.   Eyes: Negative.   Respiratory: Negative.   Cardiovascular: Negative.   Gastrointestinal: Positive for nausea.  Genitourinary: Negative.   Musculoskeletal: Negative.  Skin: Negative.   Neurological: Negative.   Endo/Heme/Allergies: Negative.       Physical Exam Blood pressure 122/78, pulse 82, temperature (!) 97.5 F (36.4 C), temperature source Temporal, height 5\' 8"  (1.727 m), weight 162 lb 12.8 oz (73.8 kg), SpO2 98 %. Last Weight  Most recent update: 11/01/2019  2:07 PM   Weight  73.8 kg (162 lb 12.8 oz)            CONSTITUTIONAL: Well developed, and nourished, appropriately responsive and aware without distress.  Very athletic appearing.  She can hold a crunch during exam, without any evidence of pain or tenderness on exam. EYES: Sclera non-icteric.   EARS, NOSE, MOUTH AND THROAT: Mask worn.   Hearing is intact to voice.  NECK: Trachea is midline, and there is no jugular venous distension.  LYMPH NODES:  Lymph nodes in the neck are not enlarged. RESPIRATORY:   Lungs are clear, and breath sounds are equal bilaterally. Normal respiratory effort without pathologic use of accessory muscles. CARDIOVASCULAR: Heart is regular in rate and rhythm. GI: The abdomen is  soft, nontender, and nondistended. There were no palpable masses. I did not appreciate hepatosplenomegaly. There were normal bowel sounds.  There is a focal small defect at the umbilicus without significant hernia sac, likely intact dermal fascial junction circumferentially.  Essentially nontender. MUSCULOSKELETAL:  Symmetrical muscle tone appreciated in all four extremities.    SKIN: Skin turgor is normal. No pathologic skin lesions appreciated.  NEUROLOGIC:  Motor and sensation appear grossly normal.  Cranial nerves are grossly without defect. PSYCH:  Alert and oriented to person, place and time. Affect is appropriate for situation.  Data Reviewed I have personally reviewed what is currently available of the patient's imaging, recent labs and medical records.   Labs:  CBC Latest Ref Rng & Units 07/07/2019 07/21/2017 04/24/2017  WBC 4.0 - 10.5 K/uL 14.5(H) 10.0 10.9(H)  Hemoglobin 12.0 - 15.0 g/dL 04/26/2017 35.4 65.6  Hematocrit 36.0 - 46.0 % 37.5 39.5 36.0  Platelets 150 - 400 K/uL 269 187 224   CMP Latest Ref Rng & Units 07/07/2019  Glucose 70 - 99 mg/dL 13/12/2018)  BUN 6 - 20 mg/dL 10  Creatinine 751(Z - 0.01 mg/dL 7.49  Sodium 4.49 - 675 mmol/L 137  Potassium 3.5 - 5.1 mmol/L 3.4(L)  Chloride 98 - 111 mmol/L 100  CO2 22 - 32 mmol/L 25  Calcium 8.9 - 10.3 mg/dL 9.5  Total Protein 6.5 - 8.1 g/dL 8.1  Total Bilirubin 0.3 - 1.2 mg/dL 916)  Alkaline Phos 38 - 126 U/L 81  AST 15 - 41 U/L 48(H)  ALT 0 - 44 U/L 58(H)      Imaging:  Within last 24 hrs: No results found.  Assessment    Small umbilical fascial defect, without significant symptomatic umbilical hernia in rather athletic patient.  Uncertain as to degree of future childbearing. Patient Active Problem List   Diagnosis Date Noted   . Nipple discharge in female 07/23/2018  . Umbilical hernia 01/28/2017  . History of 2 cesarean sections 01/28/2017    Plan    We discussed various options for repair, and the likelihood of developing any complications in the interim.  We agreed that it would be worthwhile deferring any intervention considering the lack of symptoms and pain.  Size of the hernia is also sufficiently small as to be of minimal risk for incarceration.  We will gladly see her back on an as-needed basis.  Face-to-face time spent with the  patient and accompanying care providers(if present) was 25 minutes, with more than 50% of the time spent counseling, educating, and coordinating care of the patient.      Campbell Lerner M.D., FACS 11/01/2019, 4:09 PM

## 2019-11-01 NOTE — Patient Instructions (Addendum)
Dr.Rodenberg discussed with patient the different surgical treatment options with her at today's visit.  Patient will give our office a call if she noticed the area getting worse.  Umbilical Hernia, Adult  A hernia is a bulge of tissue that pushes through an opening between muscles. An umbilical hernia happens in the abdomen, near the belly button (umbilicus). The hernia may contain tissues from the small intestine, large intestine, or fatty tissue covering the intestines (omentum). Umbilical hernias in adults tend to get worse over time, and they require surgical treatment. There are several types of umbilical hernias. You may have:  A hernia located just above or below the umbilicus (indirect hernia). This is the most common type of umbilical hernia in adults.  A hernia that forms through an opening formed by the umbilicus (direct hernia).  A hernia that comes and goes (reducible hernia). A reducible hernia may be visible only when you strain, lift something heavy, or cough. This type of hernia can be pushed back into the abdomen (reduced).  A hernia that traps abdominal tissue inside the hernia (incarcerated hernia). This type of hernia cannot be reduced.  A hernia that cuts off blood flow to the tissues inside the hernia (strangulated hernia). The tissues can start to die if this happens. This type of hernia requires emergency treatment. What are the causes? An umbilical hernia happens when tissue inside the abdomen presses on a weak area of the abdominal muscles. What increases the risk? You may have a greater risk of this condition if you:  Are obese.  Have had several pregnancies.  Have a buildup of fluid inside your abdomen (ascites).  Have had surgery that weakens the abdominal muscles. What are the signs or symptoms? The main symptom of this condition is a painless bulge at or near the belly button. A reducible hernia may be visible only when you strain, lift something heavy,  or cough. Other symptoms may include:  Dull pain.  A feeling of pressure. Symptoms of a strangulated hernia may include:  Pain that gets increasingly worse.  Nausea and vomiting.  Pain when pressing on the hernia.  Skin over the hernia becoming red or purple.  Constipation.  Blood in the stool. How is this diagnosed? This condition may be diagnosed based on:  A physical exam. You may be asked to cough or strain while standing. These actions increase the pressure inside your abdomen and force the hernia through the opening in your muscles. Your health care provider may try to reduce the hernia by pressing on it.  Your symptoms and medical history. How is this treated? Surgery is the only treatment for an umbilical hernia. Surgery for a strangulated hernia is done as soon as possible. If you have a small hernia that is not incarcerated, you may need to lose weight before having surgery. Follow these instructions at home:  Lose weight, if told by your health care provider.  Do not try to push the hernia back in.  Watch your hernia for any changes in color or size. Tell your health care provider if any changes occur.  You may need to avoid activities that increase pressure on your hernia.  Do not lift anything that is heavier than 10 lb (4.5 kg) until your health care provider says that this is safe.  Take over-the-counter and prescription medicines only as told by your health care provider.  Keep all follow-up visits as told by your health care provider. This is important. Contact a health  care provider if:  Your hernia gets larger.  Your hernia becomes painful. Get help right away if:  You develop sudden, severe pain near the area of your hernia.  You have pain as well as nausea or vomiting.  You have pain and the skin over your hernia changes color.  You develop a fever. This information is not intended to replace advice given to you by your health care provider.  Make sure you discuss any questions you have with your health care provider. Document Revised: 09/30/2017 Document Reviewed: 02/16/2017 Elsevier Patient Education  [place].

## 2019-11-16 IMAGING — US US OB < 14 WEEKS - US OB TV
1 series · 14 of 28 positions shown · non-contrast
Comparison: July 17, 2017

CLINICAL DATA: Abnormal vaginal bleeding. Status post elective
abortion 2 weeks prior.

EXAM:
OBSTETRIC <14 WK US AND TRANSVAGINAL OB US
TECHNIQUE: Both transabdominal and transvaginal ultrasound examinations were
performed for complete evaluation of the gestation as well as the
maternal uterus, adnexal regions, and pelvic cul-de-sac.
Transvaginal technique was performed to assess early pregnancy.

[Series 1: us ob < 14 weeks - us ob tv · 14 of 69 slices shown]
[im 3/69]
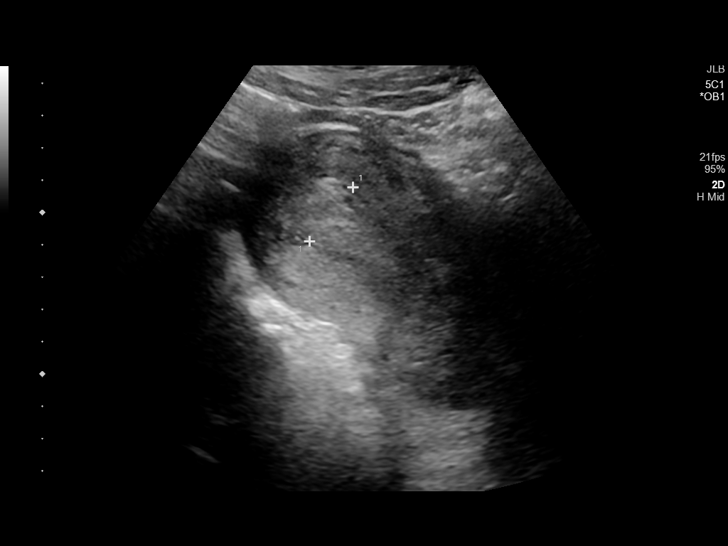
[im 8/69]
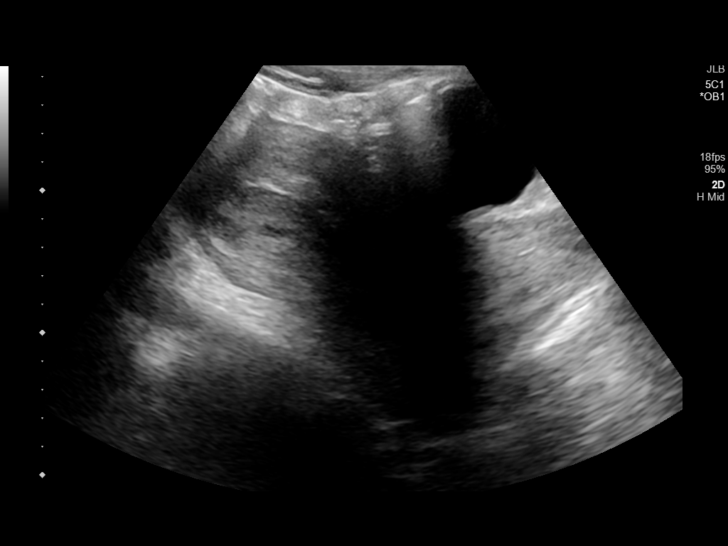
[im 13/69]
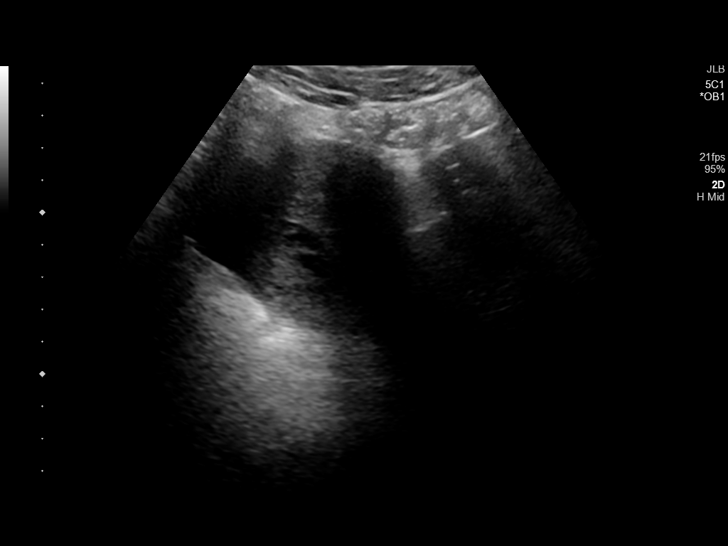
[im 18/69]
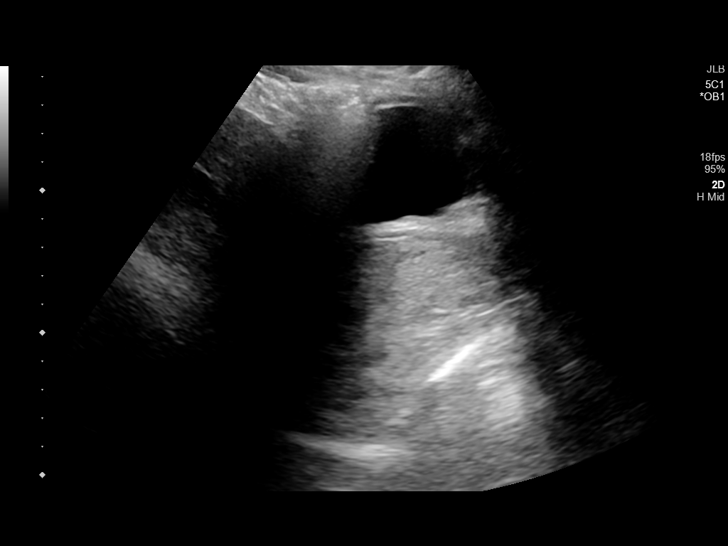
[im 23/69]
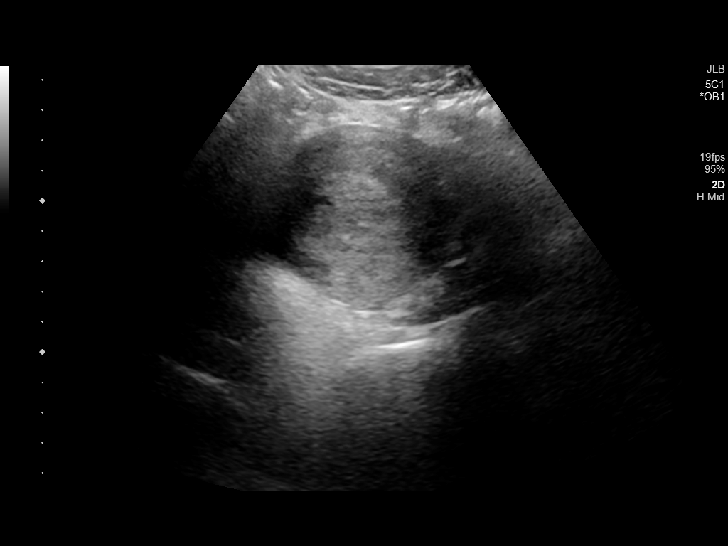
[im 28/69]
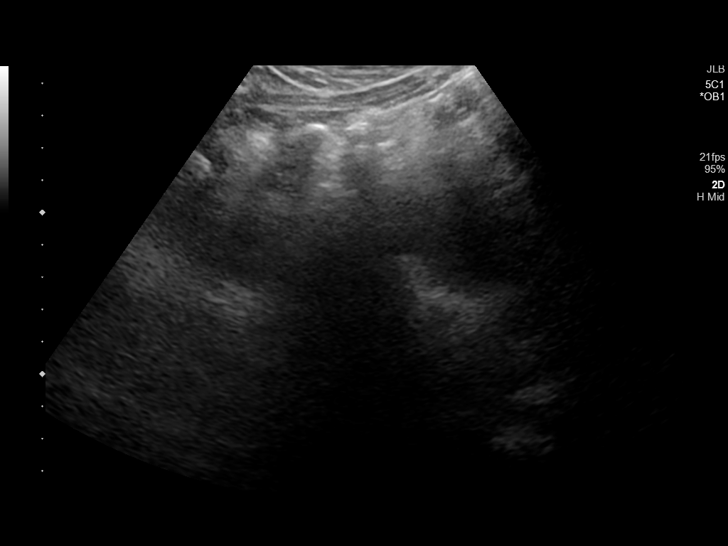
[im 33/69]
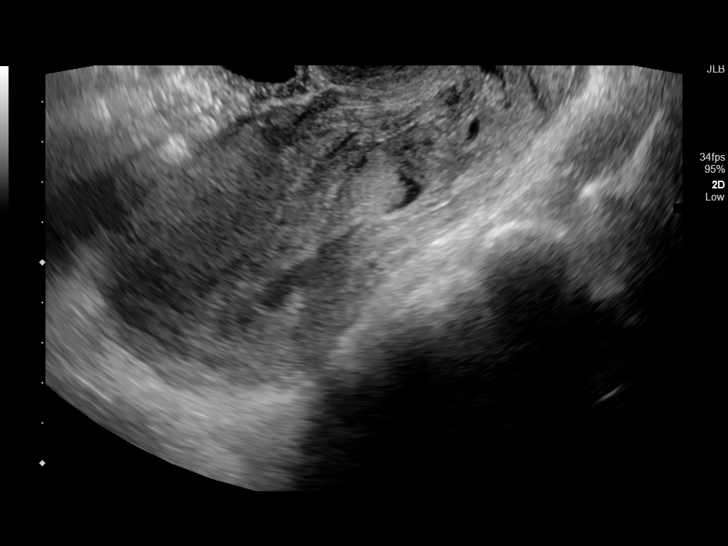
[im 38/69]
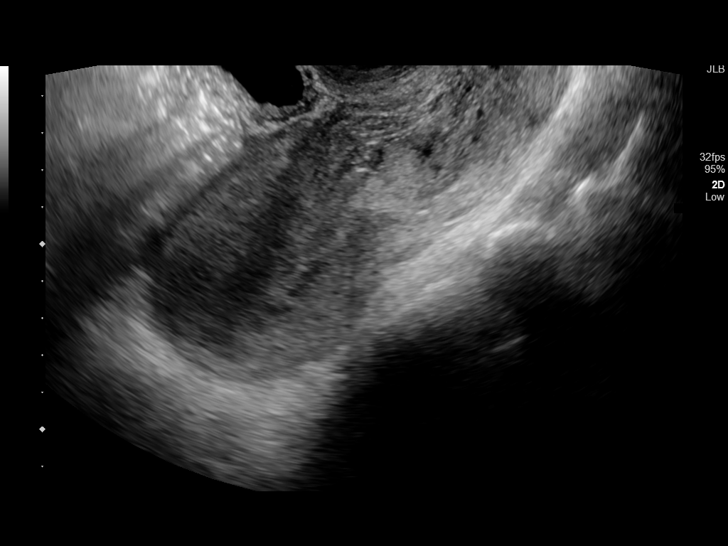
[im 43/69]
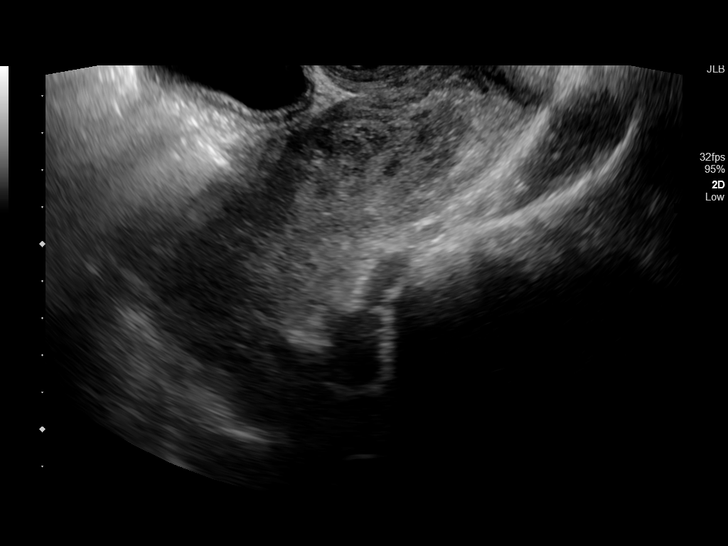
[im 48/69]
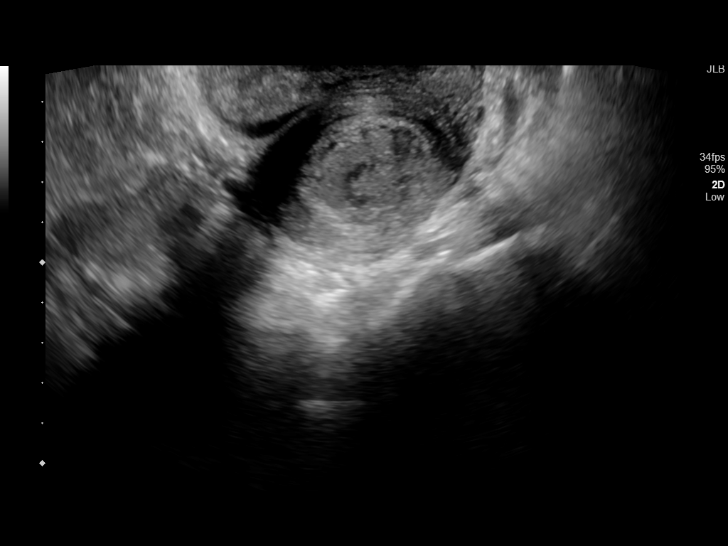
[im 53/69]
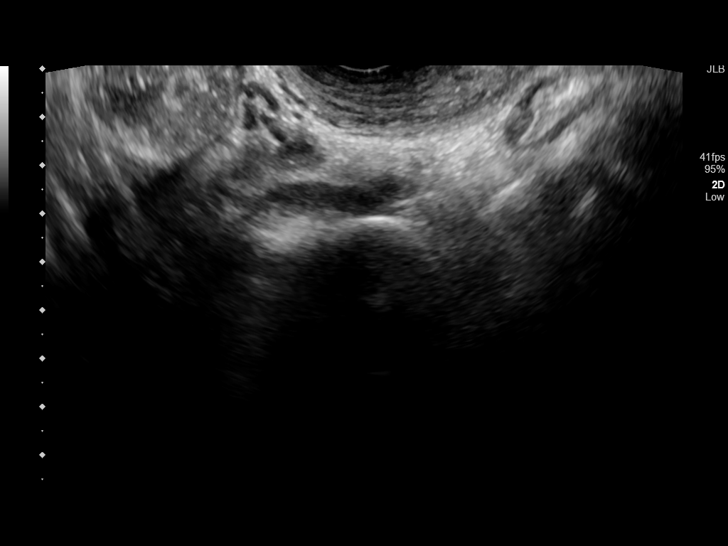
[im 58/69]
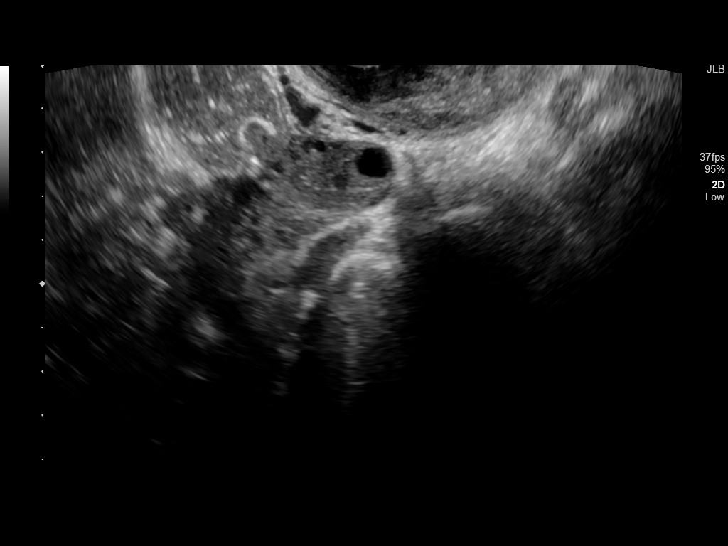
[im 63/69]
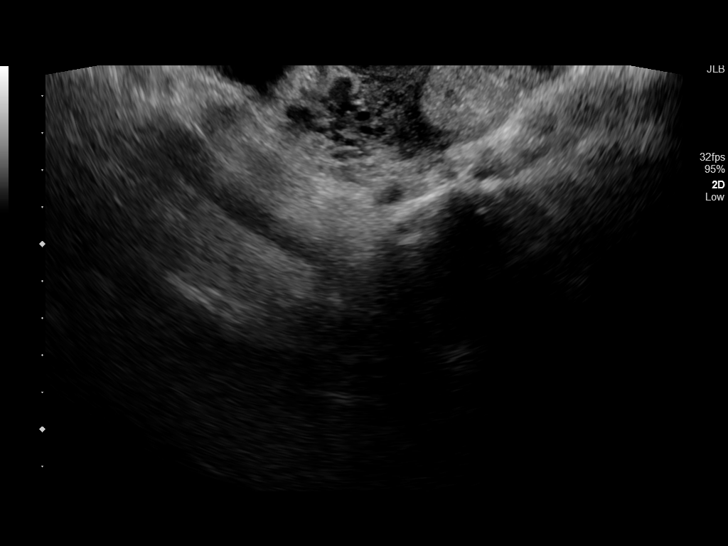
[im 69/69]
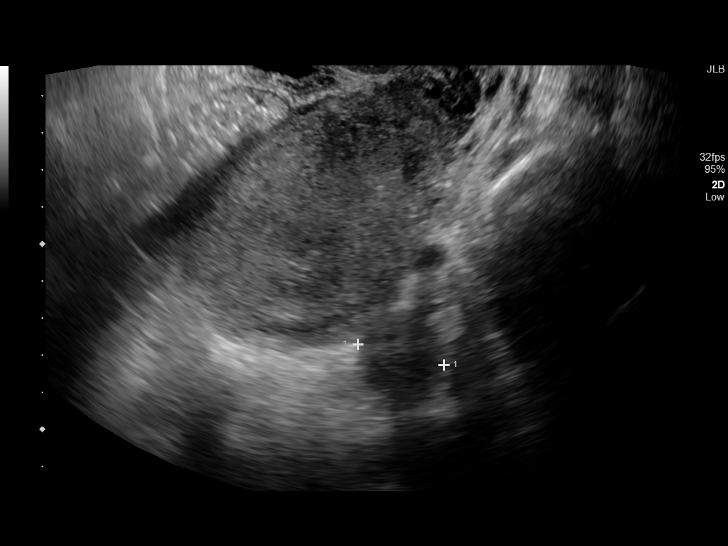

[14 of 28 positions shown; findings below may reference images not displayed]

FINDINGS: Intrauterine gestational sac: None

Yolk sac:  Not Visualized.

Embryo:  Not Visualized.

Cardiac Activity: Not Visualized.

Maternal uterus/adnexae: The right ovary measures 5.2 x 2.2 x
cm. There is a dominant 1.5 cm follicle. The left ovary measures
x 3.1 x 2.4 cm. The endometrium is thickened and heterogeneous and
hypervascular. Thickness is difficult to accurately measure.
IMPRESSION: 1. No IUP.
2. Thickened hypervascular, heterogeneous endometrium. Findings may
be secondary to retained products of conception.
3. Unremarkable appearance of the ovaries.

## 2020-01-19 ENCOUNTER — Ambulatory Visit (INDEPENDENT_AMBULATORY_CARE_PROVIDER_SITE_OTHER): Payer: Medicaid Other | Admitting: Certified Nurse Midwife

## 2020-01-19 ENCOUNTER — Other Ambulatory Visit: Payer: Self-pay

## 2020-01-19 DIAGNOSIS — Z3042 Encounter for surveillance of injectable contraceptive: Secondary | ICD-10-CM | POA: Diagnosis not present

## 2020-01-19 MED ORDER — MEDROXYPROGESTERONE ACETATE 150 MG/ML IM SUSP
150.0000 mg | Freq: Once | INTRAMUSCULAR | Status: AC
  Administered 2020-01-19: 150 mg via INTRAMUSCULAR

## 2020-01-19 NOTE — Patient Instructions (Incomplete)
Medroxyprogesterone injection [Contraceptive] What is this medicine? MEDROXYPROGESTERONE (me DROX ee proe JES te rone) contraceptive injections prevent pregnancy. They provide effective birth control for 3 months. Depo-subQ Provera 104 is also used for treating pain related to endometriosis. This medicine may be used for other purposes; ask your health care provider or pharmacist if you have questions. COMMON BRAND NAME(S): Depo-Provera, Depo-subQ Provera 104 What should I tell my health care provider before I take this medicine? They need to know if you have any of these conditions:  frequently drink alcohol  asthma  blood vessel disease or a history of a blood clot in the lungs or legs  bone disease such as osteoporosis  breast cancer  diabetes  eating disorder (anorexia nervosa or bulimia)  high blood pressure  HIV infection or AIDS  kidney disease  liver disease  mental depression  migraine  seizures (convulsions)  stroke  tobacco smoker  vaginal bleeding  an unusual or allergic reaction to medroxyprogesterone, other hormones, medicines, foods, dyes, or preservatives  pregnant or trying to get pregnant  breast-feeding How should I use this medicine? Depo-Provera Contraceptive injection is given into a muscle. Depo-subQ Provera 104 injection is given under the skin. These injections are given by a health care professional. You must not be pregnant before getting an injection. The injection is usually given during the first 5 days after the start of a menstrual period or 6 weeks after delivery of a baby. Talk to your pediatrician regarding the use of this medicine in children. Special care may be needed. These injections have been used in female children who have started having menstrual periods. Overdosage: If you think you have taken too much of this medicine contact a poison control center or emergency room at once. NOTE: This medicine is only for you. Do not  share this medicine with others. What if I miss a dose? Try not to miss a dose. You must get an injection once every 3 months to maintain birth control. If you cannot keep an appointment, call and reschedule it. If you wait longer than 13 weeks between Depo-Provera contraceptive injections or longer than 14 weeks between Depo-subQ Provera 104 injections, you could get pregnant. Use another method for birth control if you miss your appointment. You may also need a pregnancy test before receiving another injection. What may interact with this medicine? Do not take this medicine with any of the following medications:  bosentan This medicine may also interact with the following medications:  aminoglutethimide  antibiotics or medicines for infections, especially rifampin, rifabutin, rifapentine, and griseofulvin  aprepitant  barbiturate medicines such as phenobarbital or primidone  bexarotene  carbamazepine  medicines for seizures like ethotoin, felbamate, oxcarbazepine, phenytoin, topiramate  modafinil  St. John's wort This list may not describe all possible interactions. Give your health care provider a list of all the medicines, herbs, non-prescription drugs, or dietary supplements you use. Also tell them if you smoke, drink alcohol, or use illegal drugs. Some items may interact with your medicine. What should I watch for while using this medicine? This drug does not protect you against HIV infection (AIDS) or other sexually transmitted diseases. Use of this product may cause you to lose calcium from your bones. Loss of calcium may cause weak bones (osteoporosis). Only use this product for more than 2 years if other forms of birth control are not right for you. The longer you use this product for birth control the more likely you will be at risk   for weak bones. Ask your health care professional how you can keep strong bones. You may have a change in bleeding pattern or irregular periods.  Many females stop having periods while taking this drug. If you have received your injections on time, your chance of being pregnant is very low. If you think you may be pregnant, see your health care professional as soon as possible. Tell your health care professional if you want to get pregnant within the next year. The effect of this medicine may last a long time after you get your last injection. What side effects may I notice from receiving this medicine? Side effects that you should report to your doctor or health care professional as soon as possible:  allergic reactions like skin rash, itching or hives, swelling of the face, lips, or tongue  breast tenderness or discharge  breathing problems  changes in vision  depression  feeling faint or lightheaded, falls  fever  pain in the abdomen, chest, groin, or leg  problems with balance, talking, walking  unusually weak or tired  yellowing of the eyes or skin Side effects that usually do not require medical attention (report to your doctor or health care professional if they continue or are bothersome):  acne  fluid retention and swelling  headache  irregular periods, spotting, or absent periods  temporary pain, itching, or skin reaction at site where injected  weight gain This list may not describe all possible side effects. Call your doctor for medical advice about side effects. You may report side effects to FDA at 1-800-FDA-1088. Where should I keep my medicine? This does not apply. The injection will be given to you by a health care professional. NOTE: This sheet is a summary. It may not cover all possible information. If you have questions about this medicine, talk to your doctor, pharmacist, or health care provider.  2020 Elsevier/Gold Standard (2008-09-08 18:37:56)  

## 2020-01-19 NOTE — Progress Notes (Signed)
Date last pap: 10/27/2019. Last Depo-Provera: 10/27/2019. Side Effects if any: hormone issues Serum HCG indicated? n/a Depo-Provera 150 mg IM given by: Shawn Route, LPN. Next appointment due August 5-19, 2021.  Pt maybe changing birth control method soon.

## 2020-01-19 NOTE — Progress Notes (Signed)
I have reviewed the record and concur with patient management and plan of care.    Gunnar Bulla, CNM [facility], [facility] 01/19/20 5:09 PM

## 2020-04-16 ENCOUNTER — Ambulatory Visit: Payer: Medicaid Other

## 2020-04-16 NOTE — Progress Notes (Signed)
No show

## 2020-04-17 ENCOUNTER — Ambulatory Visit: Payer: Medicaid Other

## 2020-04-20 ENCOUNTER — Other Ambulatory Visit: Payer: Self-pay

## 2020-04-20 ENCOUNTER — Ambulatory Visit (INDEPENDENT_AMBULATORY_CARE_PROVIDER_SITE_OTHER): Payer: Medicaid Other | Admitting: Certified Nurse Midwife

## 2020-04-20 DIAGNOSIS — Z3042 Encounter for surveillance of injectable contraceptive: Secondary | ICD-10-CM

## 2020-04-20 MED ORDER — MEDROXYPROGESTERONE ACETATE 150 MG/ML IM SUSP
150.0000 mg | Freq: Once | INTRAMUSCULAR | Status: AC
  Administered 2020-04-20: 150 mg via INTRAMUSCULAR

## 2020-04-20 NOTE — Progress Notes (Signed)
Date last pap: 10/27/2019. Last Depo-Provera: 01/19/2020 Side Effects if any: decrease sex drive Serum HCG indicated? n/a Depo-Provera 150 mg IM given by: Jari Pigg, LPN Next appointment due November 5-19, 2021.   Pt having breast leaking and pain. Pt advised to schedule an appointment with provider.

## 2020-04-30 ENCOUNTER — Other Ambulatory Visit: Payer: Self-pay

## 2020-04-30 ENCOUNTER — Encounter: Payer: Self-pay | Admitting: Certified Nurse Midwife

## 2020-04-30 ENCOUNTER — Ambulatory Visit (INDEPENDENT_AMBULATORY_CARE_PROVIDER_SITE_OTHER): Payer: Medicaid Other | Admitting: Certified Nurse Midwife

## 2020-04-30 VITALS — BP 120/73 | HR 70 | Ht 68.0 in | Wt 171.7 lb

## 2020-04-30 DIAGNOSIS — L089 Local infection of the skin and subcutaneous tissue, unspecified: Secondary | ICD-10-CM

## 2020-04-30 DIAGNOSIS — N644 Mastodynia: Secondary | ICD-10-CM

## 2020-04-30 MED ORDER — CLINDAMYCIN HCL 300 MG PO CAPS: 300.0000 mg | ORAL_CAPSULE | Freq: Three times a day (TID) | ORAL | 0 refills | Status: AC

## 2020-04-30 NOTE — Patient Instructions (Addendum)
Clindamycin capsules What is this medicine? CLINDAMYCIN (Dupont sin) is a lincosamide antibiotic. It is used to treat certain kinds of bacterial infections. It will not work for colds, flu, or other viral infections. This medicine may be used for other purposes; ask your health care provider or pharmacist if you have questions. COMMON BRAND NAME(S): Cleocin What should I tell my health care provider before I take this medicine? They need to know if you have any of these conditions:  kidney disease  liver disease  stomach problems like colitis  an unusual or allergic reaction to clindamycin, lincomycin, or other medicines, foods, dyes like tartrazine or preservatives  pregnant or trying to get pregnant  breast-feeding How should I use this medicine? Take this medicine by mouth with a full glass of water. Follow the directions on the prescription label. You can take this medicine with food or on an empty stomach. If the medicine upsets your stomach, take it with food. Take your medicine at regular intervals. Do not take your medicine more often than directed. Take all of your medicine as directed even if you think your are better. Do not skip doses or stop your medicine early. Talk to your pediatrician regarding the use of this medicine in children. Special care may be needed. Overdosage: If you think you have taken too much of this medicine contact a poison control center or emergency room at once. NOTE: This medicine is only for you. Do not share this medicine with others. What if I miss a dose? If you miss a dose, take it as soon as you can. If it is almost time for your next dose, take only that dose. Do not take double or extra doses. What may interact with this medicine?  birth control pills  erythromycin  medicines that relax muscles for surgery  rifampin This list may not describe all possible interactions. Give your health care provider a list of all the medicines,  herbs, non-prescription drugs, or dietary supplements you use. Also tell them if you smoke, drink alcohol, or use illegal drugs. Some items may interact with your medicine. What should I watch for while using this medicine? Tell your doctor or health care provider if your symptoms do not start to get better or if they get worse. This medicine may cause serious skin reactions. They can happen weeks to months after starting the medicine. Contact your health care provider right away if you notice fevers or flu-like symptoms with a rash. The rash may be red or purple and then turn into blisters or peeling of the skin. Or, you might notice a red rash with swelling of the face, lips or lymph nodes in your neck or under your arms. Do not treat diarrhea with over the counter products. Contact your doctor if you have diarrhea that lasts more than 2 days or if it is severe and watery. What side effects may I notice from receiving this medicine? Side effects that you should report to your doctor or health care professional as soon as possible:  allergic reactions like skin rash, itching or hives, swelling of the face, lips, or tongue  dark urine  pain on swallowing  rash, fever, and swollen lymph nodes  redness, blistering, peeling or loosening of the skin, including inside the mouth  unusual bleeding or bruising  unusually weak or tired  yellowing of eyes or skin Side effects that usually do not require medical attention (report to your doctor or health care professional  if they continue or are bothersome):  diarrhea  itching in the rectal or genital area  joint pain  nausea, vomiting  stomach pain This list may not describe all possible side effects. Call your doctor for medical advice about side effects. You may report side effects to FDA at 1-800-FDA-1088. Where should I keep my medicine? Keep out of the reach of children. Store at room temperature between 20 and 25 degrees C (68 and 77  degrees F). Throw away any unused medicine after the expiration date. NOTE: This sheet is a summary. It may not cover all possible information. If you have questions about this medicine, talk to your doctor, pharmacist, or health care provider.  2020 Elsevier/Gold Standard (2018-11-18 12:02:12)  

## 2020-04-30 NOTE — Progress Notes (Signed)
GYN ENCOUNTER NOTE  Subjective:       Pamela [name] is a 34 y.o. [id number] female here for gynecologic evaluation of the following issues:  1. Right nipple pain and pustule  First noticed after removing nipple piercings. No relief with home treatment measures.   Denies difficulty breathing or respiratory distress, chest pain, abdominal pain, excessive vaginal bleeding, dysuria, and leg pain or swelling.    Gynecologic History  No LMP recorded. Patient has had an injection.  Contraception: Depo-Provera injections  Last Pap:10/2019. Results were: Neg/Neg  Obstetric History  OB History  Gravida Para Term Preterm AB Living  5 2 2  0 3 2  SAB TAB Ectopic Multiple Live Births  2 1 0 0 2    # Outcome Date GA Lbr Len/2nd Weight Sex Delivery Anes PTL Lv  5 SAB 07/2019     SAB     4 Term 07/22/17 [redacted]w[redacted]d  9 lb 11.2 oz (4.4 kg) F CS-LTranv Spinal  LIV  3 SAB 08/07/15 [redacted]w[redacted]d       ND  2 Term 12/01/08 [redacted]w[redacted]d  8 lb 11 oz (3.941 kg) F CS-LTranv EPI  LIV  1 TAB 2008        ND    Obstetric Comments  2016 [facility])    Past Medical History:  Diagnosis Date  . Headache   . History of recurrent UTIs   . Panic attacks   . Umbilical hernia   . Umbilical hernia     Past Surgical History:  Procedure Laterality Date  . CESAREAN SECTION  12/01/2008  . CESAREAN SECTION N/A 07/22/2017   Procedure: REPEAT CESAREAN SECTION (Female, 0948, 9lbs 11oz);  Surgeon: 07/24/2017, MD;  Location: [facility] ORS;  Service: Obstetrics;  Laterality: N/A;  . DILATION AND CURETTAGE OF UTERUS    . DILATION AND EVACUATION N/A 08/07/2015   Procedure: DILATATION AND EVACUATION;  Surgeon: 14/01/2015, MD;  Location: [facility] ORS;  Service: Gynecology;  Laterality: N/A;  . DILATION AND EVACUATION N/A 07/08/2019   Procedure: DILATATION AND EVACUATION;  Surgeon: 13/01/2019, MD;  Location: [facility] ORS;  Service: Gynecology;  Laterality: N/A;    Current Outpatient Medications on File Prior to Visit  Medication Sig Dispense  Refill  . ibuprofen (ADVIL) 800 MG tablet Take 1 tablet (800 mg total) by mouth every 8 (eight) hours as needed for mild pain, moderate pain or cramping. 60 tablet 1  . medroxyPROGESTERone (DEPO-PROVERA) 150 MG/ML injection Inject 1 mL (150 mg total) into the muscle once for 1 dose. 1 mL 3   No current facility-administered medications on file prior to visit.    Allergies  Allergen Reactions  . Tramadol Itching    Social History   Socioeconomic History  . Marital status: Single    Spouse name: Not on file  . Number of children: Not on file  . Years of education: Not on file  . Highest education level: Not on file  Occupational History  . Not on file  Tobacco Use  . Smoking status: Never Smoker  . Smokeless tobacco: Never Used  Vaping Use  . Vaping Use: Never used  Substance and Sexual Activity  . Alcohol use: Yes    Comment: socially  . Drug use: No  . Sexual activity: Yes    Birth control/protection: Injection  Other Topics Concern  . Not on file  Social History Narrative  . Not on file   Social Determinants of Health   Financial Resource Strain:   .  Difficulty of Paying Living Expenses: Not on file  Food Insecurity:   . Worried About Programme researcher, broadcasting/film/video in the Last Year: Not on file  . Ran Out of Food in the Last Year: Not on file  Transportation Needs:   . Lack of Transportation (Medical): Not on file  . Lack of Transportation (Non-Medical): Not on file  Physical Activity:   . Days of Exercise per Week: Not on file  . Minutes of Exercise per Session: Not on file  Stress:   . Feeling of Stress : Not on file  Social Connections:   . Frequency of Communication with Friends and Family: Not on file  . Frequency of Social Gatherings with Friends and Family: Not on file  . Attends Religious Services: Not on file  . Active Member of Clubs or Organizations: Not on file  . Attends Banker Meetings: Not on file  . Marital Status: Not on file  Intimate  Partner Violence: Not At Risk  . Fear of Current or Ex-Partner: No  . Emotionally Abused: No  . Physically Abused: No  . Sexually Abused: No    Family History  Problem Relation Age of Onset  . Healthy Mother   . Healthy Father   . Alcohol abuse Neg Hx   . Arthritis Neg Hx   . Asthma Neg Hx   . Birth defects Neg Hx   . Cancer Neg Hx   . COPD Neg Hx   . Depression Neg Hx   . Diabetes Neg Hx   . Drug abuse Neg Hx   . Early death Neg Hx   . Hearing loss Neg Hx   . Heart disease Neg Hx   . Hyperlipidemia Neg Hx   . Kidney disease Neg Hx   . Learning disabilities Neg Hx   . Mental illness Neg Hx   . Mental retardation Neg Hx   . Miscarriages / Stillbirths Neg Hx   . Stroke Neg Hx   . Vision loss Neg Hx   . Varicose Veins Neg Hx     The following portions of the patient's history were reviewed and updated as appropriate: allergies, current medications, past family history, past medical history, past social history, past surgical history and problem list.  Review of Systems  ROS negative except as noted above. Information obtained from patient.   Objective:   BP 120/73   Pulse 70   Ht 5\' 8"  (1.727 m)   Wt 171 lb 11.2 oz (77.9 kg)   BMI 26.11 kg/m    CONSTITUTIONAL: Well-developed, well-nourished female in no acute distress.   BREASTS: Breasts appear normal, no suspicious masses, no skin or nipple changes or axillary nodes except for single pustule to right nipple at piercing site.    Assessment:   1. Mastalgia  - Anaerobic and Aerobic Culture  2. Pustule  - Anaerobic and Aerobic Culture     Plan:   Right nipple pustule drained using sterile technique and fluid collected for culture; see orders.   Rx Clindamycin, see orders.   Reviewed red flag symptoms and when to call.   RTC if symptoms worsen or fail to improve.    , CNM [facility], [facility]

## 2020-05-05 LAB — ANAEROBIC AND AEROBIC CULTURE

## 2020-07-13 ENCOUNTER — Ambulatory Visit (INDEPENDENT_AMBULATORY_CARE_PROVIDER_SITE_OTHER): Payer: Medicaid Other | Admitting: Certified Nurse Midwife

## 2020-07-13 ENCOUNTER — Encounter: Payer: Self-pay | Admitting: Certified Nurse Midwife

## 2020-07-13 ENCOUNTER — Other Ambulatory Visit: Payer: Self-pay

## 2020-07-13 VITALS — BP 114/71 | HR 57 | Ht 68.0 in | Wt 172.3 lb

## 2020-07-13 DIAGNOSIS — Z30011 Encounter for initial prescription of contraceptive pills: Secondary | ICD-10-CM | POA: Diagnosis not present

## 2020-07-13 MED ORDER — LO LOESTRIN FE 1 MG-10 MCG / 10 MCG PO TABS: 1.0000 | ORAL_TABLET | Freq: Every day | ORAL | 4 refills | Status: DC

## 2020-07-13 NOTE — Patient Instructions (Signed)
Ethinyl Estradiol; Norethindrone Acetate; Ferrous fumarate tablets or capsules What is this medicine? ETHINYL ESTRADIOL; NORETHINDRONE ACETATE; FERROUS FUMARATE (ETH in il es tra DYE ole; nor eth IN drone AS e tate; FER us FUE ma rate) is an oral contraceptive. The products combine two types of female hormones, an estrogen and a progestin. They are used to prevent ovulation and pregnancy. Some products are also used to treat acne in females. This medicine may be used for other purposes; ask your health care provider or pharmacist if you have questions. COMMON BRAND NAME(S): Aurovela 24 Fe 1/20, Aurovela Fe, Blisovi 24 Fe, Blisovi Fe, Estrostep Fe, Gildess 24 Fe, Gildess Fe 1.5/30, Gildess Fe 1/20, Hailey 24 Fe, Hailey Fe 1.5/30, Junel Fe 1.5/30, Junel Fe 1/20, Junel Fe 24, Larin Fe, Lo Loestrin Fe, Loestrin 24 Fe, Loestrin FE 1.5/30, Loestrin FE 1/20, Lomedia 24 Fe, Microgestin 24 Fe, Microgestin Fe 1.5/30, Microgestin Fe 1/20, Tarina 24 Fe, Tarina Fe 1/20, Taytulla, Tilia Fe, Tri-Legest Fe What should I tell my health care provider before I take this medicine? They need to know if you have any of these conditions:  abnormal vaginal bleeding  blood vessel disease  breast, cervical, endometrial, ovarian, liver, or uterine cancer  diabetes  gallbladder disease  heart disease or recent heart attack  high blood pressure  high cholesterol  history of blood clots  kidney disease  liver disease  migraine headaches  smoke tobacco  stroke  systemic lupus erythematosus (SLE)  an unusual or allergic reaction to estrogens, progestins, other medicines, foods, dyes, or preservatives  pregnant or trying to get pregnant  breast-feeding How should I use this medicine? Take this medicine by mouth. To reduce nausea, this medicine may be taken with food. Follow the directions on the prescription label. Take this medicine at the same time each day and in the order directed on the package. Do  not take your medicine more often than directed. A patient package insert for the product will be given with each prescription and refill. Read this sheet carefully each time. The sheet may change frequently. Contact your pediatrician regarding the use of this medicine in children. Special care may be needed. This medicine has been used in female children who have started having menstrual periods. Overdosage: If you think you have taken too much of this medicine contact a poison control center or emergency room at once. NOTE: This medicine is only for you. Do not share this medicine with others. What if I miss a dose? If you miss a dose, refer to the patient information sheet you received with your medicine for direction. If you miss more than one pill, this medicine may not be as effective and you may need to use another form of birth control. What may interact with this medicine? Do not take this medicine with the following medication:  dasabuvir; ombitasvir; paritaprevir; ritonavir  ombitasvir; paritaprevir; ritonavir This medicine may also interact with the following medications:  acetaminophen  antibiotics or medicines for infections, especially rifampin, rifabutin, rifapentine, and griseofulvin, and possibly penicillins or tetracyclines  aprepitant  ascorbic acid (vitamin C)  atorvastatin  barbiturate medicines, such as phenobarbital  bosentan  carbamazepine  caffeine  clofibrate  cyclosporine  dantrolene  doxercalciferol  felbamate  grapefruit juice  hydrocortisone  medicines for anxiety or sleeping problems, such as diazepam or temazepam  medicines for diabetes, including pioglitazone  mineral oil  modafinil  mycophenolate  nefazodone  oxcarbazepine  phenytoin  prednisolone  ritonavir or other medicines for   HIV infection or AIDS  rosuvastatin  selegiline  soy isoflavones supplements  St. John's wort  tamoxifen or  raloxifene  theophylline  thyroid hormones  topiramate  warfarin This list may not describe all possible interactions. Give your health care provider a list of all the medicines, herbs, non-prescription drugs, or dietary supplements you use. Also tell them if you smoke, drink alcohol, or use illegal drugs. Some items may interact with your medicine. What should I watch for while using this medicine? Visit your doctor or health care professional for regular checks on your progress. You will need a regular breast and pelvic exam and Pap smear while on this medicine. Use an additional method of contraception during the first cycle that you take these tablets. If you have any reason to think you are pregnant, stop taking this medicine right away and contact your doctor or health care professional. If you are taking this medicine for hormone related problems, it may take several cycles of use to see improvement in your condition. Smoking increases the risk of getting a blood clot or having a stroke while you are taking birth control pills, especially if you are more than 33 years old. You are strongly advised not to smoke. This medicine can make your body retain fluid, making your fingers, hands, or ankles swell. Your blood pressure can go up. Contact your doctor or health care professional if you feel you are retaining fluid. This medicine can make you more sensitive to the sun. Keep out of the sun. If you cannot avoid being in the sun, wear protective clothing and use sunscreen. Do not use sun lamps or tanning beds/booths. If you wear contact lenses and notice visual changes, or if the lenses begin to feel uncomfortable, consult your eye care specialist. In some women, tenderness, swelling, or minor bleeding of the gums may occur. Notify your dentist if this happens. Brushing and flossing your teeth regularly may help limit this. See your dentist regularly and inform your dentist of the medicines you  are taking. If you are going to have elective surgery, you may need to stop taking this medicine before the surgery. Consult your health care professional for advice. This medicine does not protect you against HIV infection (AIDS) or any other sexually transmitted diseases. What side effects may I notice from receiving this medicine? Side effects that you should report to your doctor or health care professional as soon as possible:  allergic reactions like skin rash, itching or hives, swelling of the face, lips, or tongue  breast tissue changes or discharge  changes in vaginal bleeding during your period or between your periods  changes in vision  chest pain  confusion  coughing up blood  dizziness  feeling faint or lightheaded  headaches or migraines  leg, arm or groin pain  loss of balance or coordination  severe or sudden headaches  stomach pain (severe)  sudden shortness of breath  sudden numbness or weakness of the face, arm or leg  symptoms of vaginal infection like itching, irritation or unusual discharge  tenderness in the upper abdomen  trouble speaking or understanding  vomiting  yellowing of the eyes or skin Side effects that usually do not require medical attention (report to your doctor or health care professional if they continue or are bothersome):  breakthrough bleeding and spotting that continues beyond the 3 initial cycles of pills  breast tenderness  mood changes, anxiety, depression, frustration, anger, or emotional outbursts  increased sensitivity to sun  or ultraviolet light  nausea  skin rash, acne, or brown spots on the skin  weight gain (slight) This list may not describe all possible side effects. Call your doctor for medical advice about side effects. You may report side effects to FDA at 1-800-FDA-1088. Where should I keep my medicine? Keep out of the reach of children. Store at room temperature between 15 and 30 degrees C  (59 and 86 degrees F). Throw away any unused medicine after the expiration date. NOTE: This sheet is a summary. It may not cover all possible information. If you have questions about this medicine, talk to your doctor, pharmacist, or health care provider.  2020 Elsevier/Gold Standard (2016-04-28 08:04:41)   Preparing for Pregnancy If you are considering becoming pregnant, make an appointment to see your regular health care provider to learn how to prepare for a safe and healthy pregnancy (preconception care). During a preconception care visit, your health care provider will:  Do a complete physical exam, including a Pap test.  Take a complete medical history.  Give you information, answer your questions, and help you resolve problems. Preconception checklist Medical history  Tell your health care provider about any current or past medical conditions. Your pregnancy or your ability to become pregnant may be affected by chronic conditions, such as diabetes, chronic hypertension, and thyroid problems.  Include your family's medical history as well as your partner's medical history.  Tell your health care provider about any history of STIs (sexually transmitted infections).These can affect your pregnancy. In some cases, they can be passed to your baby. Discuss any concerns that you have about STIs.  If indicated, discuss the benefits of genetic testing. This testing will show whether there are any genetic conditions that may be passed from you or your partner to your baby.  Tell your health care provider about: ? Any problems you have had with conception or pregnancy. ? Any medicines you take. These include vitamins, herbal supplements, and over-the-counter medicines. ? Your history of immunizations. Discuss any vaccinations that you may need. Diet  Ask your health care provider what to include in a healthy diet that has a balance of nutrients. This is especially important when you are  pregnant or preparing to become pregnant.  Ask your health care provider to help you reach a healthy weight before pregnancy. ? If you are overweight, you may be at higher risk for certain complications, such as high blood pressure, diabetes, and preterm birth. ? If you are underweight, you are more likely to have a baby who has a low birth weight. Lifestyle, work, and home  Let your health care provider know: ? About any lifestyle habits that you have, such as alcohol use, drug use, or smoking. ? About recreational activities that may put you at risk during pregnancy, such as downhill skiing and certain exercise programs. ? Tell your health care provider about any international travel, especially any travel to places with an active [place] virus outbreak. ? About harmful substances that you may be exposed to at work or at home. These include chemicals, pesticides, radiation, or even litter boxes. ? If you do not feel safe at home. Mental health  Tell your health care provider about: ? Any history of mental health conditions, including feelings of depression, sadness, or anxiety. ? Any medicines that you take for a mental health condition. These include herbs and supplements. Home instructions to prepare for pregnancy Lifestyle   Eat a balanced diet. This includes fresh fruits  and vegetables, whole grains, lean meats, low-fat dairy products, healthy fats, and foods that are high in fiber. Ask to meet with a nutritionist or registered dietitian for assistance with meal planning and goals.  Get regular exercise. Try to be active for at least 30 minutes a day on most days of the week. Ask your health care provider which activities are safe during pregnancy.  Do not use any products that contain nicotine or tobacco, such as cigarettes and e-cigarettes. If you need help quitting, ask your health care provider.  Do not drink alcohol.  Do not take illegal drugs.  Maintain a healthy weight. Ask  your health care provider what weight range is right for you. General instructions  Keep an accurate record of your menstrual periods. This makes it easier for your health care provider to determine your baby's due date.  Begin taking prenatal vitamins and folic acid supplements daily as directed by your health care provider.  Manage any chronic conditions, such as high blood pressure and diabetes, as told by your health care provider. This is important. How do I know that I am pregnant? You may be pregnant if you have been sexually active and you miss your period. Symptoms of early pregnancy include:  Mild cramping.  Very light vaginal bleeding (spotting).  Feeling unusually tired.  Nausea and vomiting (morning sickness). If you have any of these symptoms and you suspect that you might be pregnant, you can take a home pregnancy test. These tests check for a hormone in your urine (human chorionic gonadotropin, or hCG). A woman's body begins to make this hormone during early pregnancy. These tests are very accurate. Wait until at least the first day after you miss your period to take one. If the test shows that you are pregnant (you get a positive result), call your health care provider to make an appointment for prenatal care. What should I do if I become pregnant?      Make an appointment with your health care provider as soon as you suspect you are pregnant.  Do not use any products that contain nicotine, such as cigarettes, chewing tobacco, and e-cigarettes. If you need help quitting, ask your health care provider.  Do not drink alcoholic beverages. Alcohol is related to a number of birth defects.  Avoid toxic odors and chemicals.  You may continue to have sexual intercourse if it does not cause pain or other problems, such as vaginal bleeding. This information is not intended to replace advice given to you by your health care provider. Make sure you discuss any questions you have  with your health care provider. Document Revised: 08/20/2017 Document Reviewed: 03/09/2016 Elsevier Patient Education  2020 [employer].

## 2020-07-13 NOTE — Progress Notes (Signed)
GYN ENCOUNTER NOTE  Subjective:       Pamela [name] is a 34 y.o. [id number] female here for contraception counseling.  Considering pregnancy in the future and wishes to change from depo to birth control pill to regulate cycles.   No risk factors. Denies difficulty breathing or respiratory distress, chest pain, abdominal pain, excessive vaginal bleeding, dysuria, and leg pain or swelling.    Gynecologic History  Patient's last menstrual period was 06/29/2020 (approximate).  Contraception: Depo-Provera injections, last dose 04/20/2020  Last Pap: 10/2019. Results were: Neg/Neg  Obstetric History  OB History  Gravida Para Term Preterm AB Living  5 2 2  0 3 2  SAB TAB Ectopic Multiple Live Births  2 1 0 0 2    # Outcome Date GA Lbr Len/2nd Weight Sex Delivery Anes PTL Lv  5 SAB 07/2019     SAB     4 Term 07/22/17 [redacted]w[redacted]d  9 lb 11.2 oz (4.4 kg) F CS-LTranv Spinal  LIV  3 SAB 08/07/15 [redacted]w[redacted]d       ND  2 Term 12/01/08 [redacted]w[redacted]d  8 lb 11 oz (3.941 kg) F CS-LTranv EPI  LIV  1 TAB 2008        ND    Obstetric Comments  2016 (D&C)    Past Medical History:  Diagnosis Date   Headache    History of recurrent UTIs    Panic attacks    Umbilical hernia    Umbilical hernia     Past Surgical History:  Procedure Laterality Date   CESAREAN SECTION  12/01/2008   CESAREAN SECTION N/A 07/22/2017   Procedure: REPEAT CESAREAN SECTION (Female, 0948, 9lbs 11oz);  Surgeon: 07/24/2017, MD;  Location: [facility];  Service: Obstetrics;  Laterality: N/A;   DILATION AND CURETTAGE OF UTERUS     DILATION AND EVACUATION N/A 08/07/2015   Procedure: DILATATION AND EVACUATION;  Surgeon: 14/01/2015, MD;  Location: [facility];  Service: Gynecology;  Laterality: N/A;   DILATION AND EVACUATION N/A 07/08/2019   Procedure: DILATATION AND EVACUATION;  Surgeon: 13/01/2019, MD;  Location: [facility];  Service: Gynecology;  Laterality: N/A;    Current Outpatient Medications on File Prior to Visit   Medication Sig Dispense Refill   ibuprofen (ADVIL) 800 MG tablet Take 1 tablet (800 mg total) by mouth every 8 (eight) hours as needed for mild pain, moderate pain or cramping. 60 tablet 1   No current facility-administered medications on file prior to visit.    Allergies  Allergen Reactions   Tramadol Itching    Social History   Socioeconomic History   Marital status: Single    Spouse name: Not on file   Number of children: Not on file   Years of education: Not on file   Highest education level: Not on file  Occupational History   Not on file  Tobacco Use   Smoking status: Never Smoker   Smokeless tobacco: Never Used  Vaping Use   Vaping Use: Never used  Substance and Sexual Activity   Alcohol use: Yes    Comment: socially   Drug use: No   Sexual activity: Yes    Birth control/protection: Injection    Comment: requests OCP  Other Topics Concern   Not on file  Social History Narrative   Not on file   Social Determinants of Health   Financial Resource Strain:    Difficulty of Paying Living Expenses: Not on file  Food Insecurity:  Worried About Programme researcher, broadcasting/film/video in the Last Year: Not on file   [employer] of Food in the Last Year: Not on file  Transportation Needs:    Lack of Transportation (Medical): Not on file   Lack of Transportation (Non-Medical): Not on file  Physical Activity:    Days of Exercise per Week: Not on file   Minutes of Exercise per Session: Not on file  Stress:    Feeling of Stress : Not on file  Social Connections:    Frequency of Communication with Friends and Family: Not on file   Frequency of Social Gatherings with Friends and Family: Not on file   Attends Religious Services: Not on file   Active Member of Clubs or Organizations: Not on file   Attends Banker Meetings: Not on file   Marital Status: Not on file  Intimate Partner Violence:    Fear of Current or Ex-Partner: Not on file    Emotionally Abused: Not on file   Physically Abused: Not on file   Sexually Abused: Not on file    Family History  Problem Relation Age of Onset   Healthy Mother    Healthy Father    Alcohol abuse Neg Hx    Arthritis Neg Hx    Asthma Neg Hx    Birth defects Neg Hx    Cancer Neg Hx    COPD Neg Hx    Depression Neg Hx    Diabetes Neg Hx    Drug abuse Neg Hx    Early death Neg Hx    Hearing loss Neg Hx    Heart disease Neg Hx    Hyperlipidemia Neg Hx    Kidney disease Neg Hx    Learning disabilities Neg Hx    Mental illness Neg Hx    Mental retardation Neg Hx    Miscarriages / Stillbirths Neg Hx    Stroke Neg Hx    Vision loss Neg Hx    Varicose Veins Neg Hx     The following portions of the patient's history were reviewed and updated as appropriate: allergies, current medications, past family history, past medical history, past social history, past surgical history and problem list.  Review of Systems  ROS negative except as noted above. Information obtained from patient.   Objective:   BP 114/71    Pulse (!) 57    Ht 5\' 8"  (1.727 m)    Wt 172 lb 4.8 oz (78.2 kg)    LMP 06/29/2020 (Approximate)    BMI 26.20 kg/m    CONSTITUTIONAL: Well-developed, well-nourished female in no acute distress.   PHYSICAL EXAM: Not indicated.    Assessment:   1. Initiation of oral contraception   Plan:   Rx Lo loestrin, see orders. Samples provided.   Education provided including ACHES, see AVS.   Reviewed red flag symptoms and when to call.   RTC as previously scheduled or sooner if needed.    07/01/2020, CNM [facility], [facility] 07/13/20 9:44 AM

## 2020-10-15 ENCOUNTER — Encounter: Payer: Self-pay | Admitting: Certified Nurse Midwife

## 2020-10-15 ENCOUNTER — Other Ambulatory Visit: Payer: Self-pay

## 2020-10-15 ENCOUNTER — Ambulatory Visit (INDEPENDENT_AMBULATORY_CARE_PROVIDER_SITE_OTHER): Payer: Medicaid Other | Admitting: Certified Nurse Midwife

## 2020-10-15 VITALS — BP 115/69 | HR 62 | Ht 68.0 in | Wt 169.4 lb

## 2020-10-15 DIAGNOSIS — Z3041 Encounter for surveillance of contraceptive pills: Secondary | ICD-10-CM

## 2020-10-15 DIAGNOSIS — M79672 Pain in left foot: Secondary | ICD-10-CM | POA: Diagnosis not present

## 2020-10-15 DIAGNOSIS — S8012XA Contusion of left lower leg, initial encounter: Secondary | ICD-10-CM

## 2020-10-15 NOTE — Progress Notes (Signed)
Pt present due to having bruise on left leg since 09/01/2020.

## 2020-10-15 NOTE — Patient Instructions (Signed)
Foot Pain Many things can cause foot pain. Some common causes are:  An injury.  A sprain.  Arthritis.  Blisters.  Bunions. Follow these instructions at home: Managing pain, stiffness, and swelling If directed, put ice on the painful area:  Put ice in a plastic bag.  Place a towel between your skin and the bag.  Leave the ice on for 20 minutes, 2-3 times a day.   Activity  Do not stand or walk for long periods.  Return to your normal activities as told by your health care provider. Ask your health care provider what activities are safe for you.  Do stretches to relieve foot pain and stiffness as told by your health care provider.  Do not lift anything that is heavier than 10 lb (4.5 kg), or the limit that you are told, until your health care provider says that it is safe. Lifting a lot of weight can put added pressure on your feet. Lifestyle  Wear comfortable, supportive shoes that fit you well. Do not wear high heels.  Keep your feet clean and dry. General instructions  Take over-the-counter and prescription medicines only as told by your health care provider.  Rub your foot gently.  Pay attention to any changes in your symptoms.  Keep all follow-up visits as told by your health care provider. This is important. Contact a health care provider if:  Your pain does not get better after a few days of self-care.  Your pain gets worse.  You cannot stand on your foot. Get help right away if:  Your foot is numb or tingling.  Your foot or toes are swollen.  Your foot or toes turn white or blue.  You have warmth and redness along your foot. Summary  Common causes of foot pain are injury, sprain, arthritis, blisters or bunions.  Ice, medicines, and comfortable shoes may help foot pain.  Contact your health care provider if your pain does not get better after a few days of self-care. This information is not intended to replace advice given to you by your health  care provider. Make sure you discuss any questions you have with your health care provider. Document Revised: 06/03/2018 Document Reviewed: 06/03/2018 Elsevier Patient Education  2021 Elsevier Inc.   Contusion A contusion is a deep bruise. This is a result of an injury that causes bleeding under the skin. Symptoms of bruising include pain, swelling, and discolored skin. The skin may turn blue, purple, or yellow. Follow these instructions at home: Managing pain, stiffness, and swelling You may use RICE. This stands for:  Resting.  Icing.  Compression, or putting pressure.  Elevating, or raising the injured area. To follow this method, do these actions:  Rest the injured area.  If told, put ice on the injured area. ? Put ice in a plastic bag. ? Place a towel between your skin and the bag. ? Leave the ice on for 20 minutes, 2-3 times per day.  If told, put light pressure (compression) on the injured area using an elastic bandage. Make sure the bandage is not too tight. If the area tingles or becomes numb, remove it and put it back on as told by your doctor.  If possible, raise (elevate) the injured area above the level of your heart while you are sitting or lying down.   General instructions  Take over-the-counter and prescription medicines only as told by your doctor.  Keep all follow-up visits as told by your doctor. This is important.  Contact a doctor if:  Your symptoms do not get better after several days of treatment.  Your symptoms get worse.  You have trouble moving the injured area. Get help right away if:  You have very bad pain.  You have a loss of feeling (numbness) in a hand or foot.  Your hand or foot turns pale or cold. Summary  A contusion is a deep bruise. This is a result of an injury that causes bleeding under the skin.  Symptoms of bruising include pain, swelling, and discolored skin. The skin may turn blue, purple, or yellow.  This condition is  treated with rest, ice, compression, and elevation. This is also called RICE. You may be given over-the-counter medicines for pain.  Contact a doctor if you do not feel better, or you feel worse. Get help right away if you have very bad pain, have lost feeling in a hand or foot, or the area turns pale or cold. This information is not intended to replace advice given to you by your health care provider. Make sure you discuss any questions you have with your health care provider. Document Revised: 04/09/2018 Document Reviewed: 04/09/2018 Elsevier Patient Education  2021 [employer].
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
## 2020-10-16 LAB — COMPREHENSIVE METABOLIC PANEL
ALT: 21 IU/L (ref 0–32)
AST: 25 IU/L (ref 0–40)
Albumin/Globulin Ratio: 1.3 (ref 1.2–2.2)
Albumin: 4.3 g/dL (ref 3.8–4.8)
Alkaline Phosphatase: 58 IU/L (ref 44–121)
BUN/Creatinine Ratio: 11 (ref 9–23)
BUN: 11 mg/dL (ref 6–20)
Bilirubin Total: 0.3 mg/dL (ref 0.0–1.2)
CO2: 25 mmol/L (ref 20–29)
Calcium: 9.3 mg/dL (ref 8.7–10.2)
Chloride: 102 mmol/L (ref 96–106)
Creatinine, Ser: 1.04 mg/dL — ABNORMAL HIGH (ref 0.57–1.00)
GFR calc Af Amer: 82 mL/min/{1.73_m2} (ref >59)
GFR calc non Af Amer: 71 mL/min/{1.73_m2} (ref >59)
Globulin, Total: 3.2 g/dL (ref 1.5–4.5)
Glucose: 69 mg/dL (ref 65–99)
Potassium: 4.3 mmol/L (ref 3.5–5.2)
Sodium: 140 mmol/L (ref 134–144)
Total Protein: 7.5 g/dL (ref 6.0–8.5)

## 2020-10-16 LAB — CBC
Hematocrit: 41.9 % (ref 34.0–46.6)
Hemoglobin: 13.9 g/dL (ref 11.1–15.9)
MCH: 30 pg (ref 26.6–33.0)
MCHC: 33.2 g/dL (ref 31.5–35.7)
MCV: 90 fL (ref 79–97)
Platelets: 264 10*3/uL (ref 150–450)
RBC: 4.64 x10E6/uL (ref 3.77–5.28)
RDW: 12.9 % (ref 11.7–15.4)
WBC: 6.7 10*3/uL (ref 3.4–10.8)

## 2020-10-16 LAB — B12 AND FOLATE PANEL
Folate: 7.6 ng/mL (ref >3.0)
Vitamin B-12: 490 pg/mL (ref 232–1245)

## 2020-10-16 LAB — TSH: TSH: 1.13 u[IU]/mL (ref 0.450–4.500)

## 2020-11-01 ENCOUNTER — Encounter: Payer: Medicaid Other | Admitting: Certified Nurse Midwife

## 2020-12-27 ENCOUNTER — Encounter: Payer: Self-pay | Admitting: Certified Nurse Midwife

## 2020-12-27 ENCOUNTER — Ambulatory Visit (INDEPENDENT_AMBULATORY_CARE_PROVIDER_SITE_OTHER): Payer: Medicaid Other | Admitting: Certified Nurse Midwife

## 2020-12-27 ENCOUNTER — Other Ambulatory Visit: Payer: Self-pay

## 2020-12-27 VITALS — BP 115/78 | HR 62 | Ht 68.0 in | Wt 168.1 lb

## 2020-12-27 DIAGNOSIS — Z01419 Encounter for gynecological examination (general) (routine) without abnormal findings: Secondary | ICD-10-CM | POA: Diagnosis not present

## 2020-12-27 DIAGNOSIS — R6882 Decreased libido: Secondary | ICD-10-CM

## 2020-12-27 NOTE — Patient Instructions (Signed)

## 2020-12-27 NOTE — Progress Notes (Signed)
Pt present for annual exam. Pt stated that she was doing well.  

## 2020-12-27 NOTE — Progress Notes (Signed)
ANNUAL PREVENTATIVE CARE GYN  ENCOUNTER NOTE  Subjective:       Pamela [name] is a 35 y.o. [phone] female here for a routine annual gynecologic exam.  Current complaints: 1. Low libido  Denies difficulty breathing or respiratory distress, chest pain, abdominal pain, excessive vaginal bleeding, dysuria, and leg pain or swelling.    Gynecologic History  Patient's last menstrual period was 12/06/2020. Period Duration (Days): 3-4 Period Pattern: Regular Menstrual Flow: Light Menstrual Control: Thin pad,Panty liner Menstrual Control Change Freq (Hours): 2-3 Dysmenorrhea: None  Contraception: none  Last Pap: 10/2019. Results were: Neg/Neg  Obstetric History  OB History  Gravida Para Term Preterm AB Living  5 2 2  0 3 2  SAB IAB Ectopic Multiple Live Births  2 1 0 0 2    # Outcome Date GA Lbr Len/2nd Weight Sex Delivery Anes PTL Lv  5 SAB 07/2019     SAB     4 Term 07/22/17 [redacted]w[redacted]d  9 lb 11.2 oz (4.4 kg) F CS-LTranv Spinal  LIV  3 SAB 08/07/15 [redacted]w[redacted]d       ND  2 Term 12/01/08 [redacted]w[redacted]d  8 lb 11 oz (3.941 kg) F CS-LTranv EPI  LIV  1 IAB 2008        ND    Obstetric Comments  2016 [facility])    Past Medical History:  Diagnosis Date  . Headache   . History of recurrent UTIs   . Panic attacks   . Umbilical hernia   . Umbilical hernia     Past Surgical History:  Procedure Laterality Date  . CESAREAN SECTION  12/01/2008  . CESAREAN SECTION N/A 07/22/2017   Procedure: REPEAT CESAREAN SECTION (Female, 0948, 9lbs 11oz);  Surgeon: 07/24/2017, MD;  Location: [facility];  Service: Obstetrics;  Laterality: N/A;  . DILATION AND CURETTAGE OF UTERUS    . DILATION AND EVACUATION N/A 08/07/2015   Procedure: DILATATION AND EVACUATION;  Surgeon: 14/01/2015, MD;  Location: [facility];  Service: Gynecology;  Laterality: N/A;  . DILATION AND EVACUATION N/A 07/08/2019   Procedure: DILATATION AND EVACUATION;  Surgeon: 13/01/2019, MD;  Location: [facility];  Service: Gynecology;  Laterality: N/A;     Current Outpatient Medications on File Prior to Visit  Medication Sig Dispense Refill  . IBUPROFEN PO Take by mouth as needed.    . Multiple Vitamin (MULTI-VITAMIN DAILY PO) Take by mouth.     No current facility-administered medications on file prior to visit.    Allergies  Allergen Reactions  . Tramadol Itching    Social History   Socioeconomic History  . Marital status: Single    Spouse name: Not on file  . Number of children: Not on file  . Years of education: Not on file  . Highest education level: Not on file  Occupational History  . Not on file  Tobacco Use  . Smoking status: Never Smoker  . Smokeless tobacco: Never Used  Vaping Use  . Vaping Use: Never used  Substance and Sexual Activity  . Alcohol use: Yes    Comment: socially  . Drug use: No  . Sexual activity: Yes    Birth control/protection: Injection    Comment: requests OCP  Other Topics Concern  . Not on file  Social History Narrative  . Not on file   Social Determinants of Health   Financial Resource Strain: Not on file  Food Insecurity: Not on file  Transportation Needs: Not on file  Physical  Activity: Not on file  Stress: Not on file  Social Connections: Not on file  Intimate Partner Violence: Not on file    Family History  Problem Relation Age of Onset  . Healthy Mother   . Healthy Father   . Alcohol abuse Neg Hx   . Arthritis Neg Hx   . Asthma Neg Hx   . Birth defects Neg Hx   . Cancer Neg Hx   . COPD Neg Hx   . Depression Neg Hx   . Diabetes Neg Hx   . Drug abuse Neg Hx   . Early death Neg Hx   . Hearing loss Neg Hx   . Heart disease Neg Hx   . Hyperlipidemia Neg Hx   . Kidney disease Neg Hx   . Learning disabilities Neg Hx   . Mental illness Neg Hx   . Mental retardation Neg Hx   . Miscarriages / Stillbirths Neg Hx   . Stroke Neg Hx   . Vision loss Neg Hx   . Varicose Veins Neg Hx     The following portions of the patient's history were reviewed and updated as  appropriate: allergies, current medications, past family history, past medical history, past social history, past surgical history and problem list.  Review of Systems  ROS negative except as noted above. Information obtained from patient.    Objective:   BP 115/78   Pulse 62   Ht 5\' 8"  (1.727 m)   Wt 168 lb 1.6 oz (76.2 kg)   LMP 12/06/2020   BMI 25.56 kg/m    CONSTITUTIONAL: Well-developed, well-nourished female in no acute distress.   PSYCHIATRIC: Normal mood and affect. Normal behavior. Normal judgment and thought content.  NEUROLGIC: Alert and oriented to person, place, and time. Normal muscle tone coordination. No  cranial nerve deficit noted.  HENT:  Normocephalic, atraumatic.  EYES: Conjunctivae and EOM are normal.   NECK: Normal range of motion, supple, no masses.  Normal thyroid.   SKIN: Skin is warm and dry. No rash noted. Not diaphoretic. No erythema. No pallor. Professional tattoos present.   CARDIOVASCULAR: Normal heart rate noted, regular rhythm, no murmur.  RESPIRATORY: Clear to auscultation bilaterally. Effort and breath sounds normal, no problems with  respiration noted.  BREASTS: Symmetric in size. No masses, skin changes, nipple drainage, or lymphadenopathy.  ABDOMEN: Soft, normal bowel sounds, no distention noted.  No tenderness, rebound or guarding.   PELVIC: Declined by patient.    MUSCULOSKELETAL: Normal range of motion. No tenderness.  No cyanosis, clubbing, or edema.  2+ distal pulses.  Assessment:   Annual gynecologic examination 35 y.o.   Contraception: none   Normal BMI   Problem List Items Addressed This Visit   None   Visit Diagnoses    Encounter for well woman exam with routine gynecological exam    -  Primary   Relevant Orders   Testosterone, Free, Total, SHBG   FSH/LH   Progesterone   Estradiol   TSH   Low libido       Relevant Orders   Testosterone, Free, Total, SHBG   FSH/LH   Progesterone   Estradiol   TSH       Plan:   Pap: Not needed  Labs: See orders  Routine preventative health maintenance measures emphasized: Exercise/Diet/Weight control, Tobacco Warnings, Alcohol/Substance use risks and Stress Management; see AVS  Reviewed red flag symptoms and when to call  Return to Clinic - 1 Year for 33 or sooner  if needed   Serafina Royals, CNM  [facility], [facility] 12/27/20 1:27 PM

## 2020-12-30 LAB — TESTOSTERONE, FREE, TOTAL, SHBG
Sex Hormone Binding: 77.8 nmol/L (ref 24.6–122.0)
Testosterone, Free: 0.8 pg/mL (ref 0.0–4.2)
Testosterone: 17 ng/dL (ref 8–60)

## 2020-12-30 LAB — FSH/LH
FSH: 3 m[IU]/mL
LH: 4.5 m[IU]/mL

## 2020-12-30 LAB — PROGESTERONE: Progesterone: 10.5 ng/mL

## 2020-12-30 LAB — ESTRADIOL: Estradiol: 164 pg/mL

## 2020-12-30 LAB — TSH: TSH: 1.27 u[IU]/mL (ref 0.450–4.500)

## 2021-08-13 ENCOUNTER — Other Ambulatory Visit: Payer: Self-pay

## 2021-08-13 ENCOUNTER — Ambulatory Visit: Payer: Medicaid Other | Admitting: Obstetrics and Gynecology

## 2021-08-13 ENCOUNTER — Encounter: Payer: Self-pay | Admitting: Obstetrics and Gynecology

## 2021-08-13 VITALS — BP 117/76 | HR 79 | Ht 68.0 in | Wt 168.6 lb

## 2021-08-13 DIAGNOSIS — Z3201 Encounter for pregnancy test, result positive: Secondary | ICD-10-CM | POA: Diagnosis not present

## 2021-08-13 DIAGNOSIS — Z3200 Encounter for pregnancy test, result unknown: Secondary | ICD-10-CM

## 2021-08-13 DIAGNOSIS — N912 Amenorrhea, unspecified: Secondary | ICD-10-CM | POA: Diagnosis not present

## 2021-08-13 LAB — POCT URINE PREGNANCY: Preg Test, Ur: POSITIVE — AB

## 2021-08-13 NOTE — Progress Notes (Signed)
HPI:      Ms. JANEYA DEYO is a 35 y.o. [id number] who LMP was Patient's last menstrual period was 06/16/2021 (exact date).  Subjective:   She presents today after missing a menstrual period.  She believes she is approximately 8 weeks estimated gestational age based on LMP. She has had an abdominal wall hernia that causes her problems "only when I am pregnant".  She says that it is slightly worse at this time.  She has had a surgical consult regarding this and as she has not not to get it fixed yet. She is not yet taking prenatal vitamins. She has a history of prior cesarean delivery x2 and desires a repeat cesarean delivery.    Hx: The following portions of the patient's history were reviewed and updated as appropriate:             She  has a past medical history of Headache, History of recurrent UTIs, Panic attacks, Umbilical hernia, and Umbilical hernia. She does not have any pertinent problems on file. She  has a past surgical history that includes Cesarean section (12/01/2008); Dilation and evacuation (N/A, 08/07/2015); Cesarean section (N/A, 07/22/2017); Dilation and evacuation (N/A, 07/08/2019); and Dilation and curettage of uterus. Her family history includes Healthy in her father and mother. She  reports that she has never smoked. She has never used smokeless tobacco. She reports current alcohol use. She reports that she does not use drugs. She has a current medication list which includes the following prescription(s): ibuprofen and multiple vitamin. She is allergic to tramadol.       Review of Systems:  Review of Systems  Constitutional: Denied constitutional symptoms, night sweats, recent illness, fatigue, fever, insomnia and weight loss.  Eyes: Denied eye symptoms, eye pain, photophobia, vision change and visual disturbance.  Ears/Nose/Throat/Neck: Denied ear, nose, throat or neck symptoms, hearing loss, nasal discharge, sinus congestion and sore throat.  Cardiovascular: Denied  cardiovascular symptoms, arrhythmia, chest pain/pressure, edema, exercise intolerance, orthopnea and palpitations.  Respiratory: Denied pulmonary symptoms, asthma, pleuritic pain, productive sputum, cough, dyspnea and wheezing.  Gastrointestinal: Denied, gastro-esophageal reflux, melena, nausea and vomiting.  Genitourinary: Denied genitourinary symptoms including symptomatic vaginal discharge, pelvic relaxation issues, and urinary complaints.  Musculoskeletal: Denied musculoskeletal symptoms, stiffness, swelling, muscle weakness and myalgia.  Dermatologic: Denied dermatology symptoms, rash and scar.  Neurologic: Denied neurology symptoms, dizziness, headache, neck pain and syncope.  Psychiatric: Denied psychiatric symptoms, anxiety and depression.  Endocrine: Denied endocrine symptoms including hot flashes and night sweats.   Meds:   Current Outpatient Medications on File Prior to Visit  Medication Sig Dispense Refill   IBUPROFEN PO Take by mouth as needed. (Patient not taking: Reported on 08/13/2021)     Multiple Vitamin (MULTI-VITAMIN DAILY PO) Take by mouth. (Patient not taking: Reported on 08/13/2021)     No current facility-administered medications on file prior to visit.      Objective:     Vitals:   08/13/21 0850  BP: 117/76  Pulse: 79   Filed Weights   08/13/21 0850  Weight: 168 lb 9.6 oz (76.5 kg)              Urinary pregnancy test positive          Assessment:    [id number] Patient Active Problem List   Diagnosis Date Noted   Nipple discharge in female 07/23/2018   Umbilical hernia 01/28/2017   History of 2 cesarean sections 01/28/2017     1. Possible pregnancy, not yet confirmed  Approximately 8 weeks estimated gestational age based on LMP.   Plan:            Prenatal Plan 1.  The patient was given prenatal literature. 2.  She was begun on prenatal vitamins. 3.  A prenatal lab panel to be drawn at nurse visit. 4.  An ultrasound was ordered to  better determine an EDC. 5.  A nurse visit was scheduled. 6.  Genetic testing and testing for other inheritable conditions discussed in detail. She will decide in the future whether to have these labs performed. 7.  A general overview of pregnancy testing, visit schedule, ultrasound schedule, and prenatal care was discussed. 8.  COVID and its risks associated with pregnancy, prevention by limiting exposure and use of masks, as well as the risks and benefits of vaccination during pregnancy were discussed in detail.  [facility] policy regarding office and hospital visitation and testing was explained. 9.  Benefits of breast-feeding discussed in detail including both maternal and infant benefits. Ready Set Baby website discussed.  Orders Orders Placed This Encounter  Procedures   US OB Comp Less 14 Wks   POCT urine pregnancy    No orders of the defined types were placed in this encounter.     F/U  Return in about 5 weeks (around 09/17/2021). I spent 23 minutes involved in the care of this patient preparing to see the patient by obtaining and reviewing her medical history (including labs, imaging tests and prior procedures), documenting clinical information in the electronic health record (EHR), counseling and coordinating care plans, writing and sending prescriptions, ordering tests or procedures and in direct communicating with the patient and medical staff discussing pertinent items from her history and physical exam.  Elonda Husky, M.D. 08/13/2021 9:26 AM

## 2021-08-21 ENCOUNTER — Telehealth: Payer: Self-pay | Admitting: Obstetrics and Gynecology

## 2021-08-21 NOTE — Telephone Encounter (Signed)
Pt is calling in stating that she would like to go with Prenatal Vitamins [facility].  Pt stated that she will be out of the samples she was given.   Pharm:  [facility] [place], [place]

## 2021-08-22 ENCOUNTER — Other Ambulatory Visit: Payer: Self-pay

## 2021-08-22 MED ORDER — PRENATE PIXIE 10-0.6-0.4-200 MG PO CAPS: 1.0000 | ORAL_CAPSULE | Freq: Every day | ORAL | 3 refills | Status: DC

## 2021-08-22 NOTE — Telephone Encounter (Signed)
Prenatals sent.

## 2021-09-01 NOTE — L&D Delivery Note (Signed)
Delivery Summary for Pamela [name]  Labor Events:   Preterm labor: No data found  Rupture date: No data found  Rupture time: No data found  Rupture type: No data found  Fluid Color: No data found  Induction: No data found  Augmentation: No data found  Complications: No data found  Cervical ripening: No data found No data found   No data found     Delivery:   Episiotomy: No data found  Lacerations: No data found  Repair suture: No data found  Repair # of packets: No data found  Blood loss (ml): No data found   Information for the patient's newbornKristyl, [name] [[id number]]   Delivery 03/17/2022 8:24 AM by  C-Section, Low Transverse Sex:  female Gestational Age: [redacted]w[redacted]d Delivery Clinician:   Living?:         APGARS  One minute Five minutes Ten minutes  Skin color:        Heart rate:        Grimace:        Muscle tone:        Breathing:        Totals: 9  9      Presentation/position:      Resuscitation:   Cord information:    Disposition of cord blood:     Blood gases sent?  Complications:   Placenta: Delivered:       appearance Newborn Measurements: Weight: 8 lb 6 oz (3800 g)  Height: 21"  Head circumference:    Chest circumference:    Other providers:    Additional  information: Forceps:   Vacuum:   Breech:   Observed anomalies       See Dr. Oretha Milch operative note for details of C-section procedure.    Hildred Laser, MD [facility]

## 2021-09-03 ENCOUNTER — Ambulatory Visit (INDEPENDENT_AMBULATORY_CARE_PROVIDER_SITE_OTHER): Payer: Medicaid Other

## 2021-09-03 ENCOUNTER — Other Ambulatory Visit: Payer: Self-pay

## 2021-09-03 DIAGNOSIS — Z3200 Encounter for pregnancy test, result unknown: Secondary | ICD-10-CM | POA: Diagnosis not present

## 2021-09-06 ENCOUNTER — Ambulatory Visit (INDEPENDENT_AMBULATORY_CARE_PROVIDER_SITE_OTHER): Payer: Medicaid Other | Admitting: Obstetrics and Gynecology

## 2021-09-06 ENCOUNTER — Other Ambulatory Visit: Payer: Self-pay

## 2021-09-06 VITALS — BP 109/71 | HR 77 | Wt 171.4 lb

## 2021-09-06 DIAGNOSIS — Z1379 Encounter for other screening for genetic and chromosomal anomalies: Secondary | ICD-10-CM | POA: Diagnosis not present

## 2021-09-06 DIAGNOSIS — Z3A11 11 weeks gestation of pregnancy: Secondary | ICD-10-CM | POA: Diagnosis not present

## 2021-09-06 DIAGNOSIS — Z0283 Encounter for blood-alcohol and blood-drug test: Secondary | ICD-10-CM | POA: Diagnosis not present

## 2021-09-06 DIAGNOSIS — Z3481 Encounter for supervision of other normal pregnancy, first trimester: Secondary | ICD-10-CM | POA: Diagnosis not present

## 2021-09-06 DIAGNOSIS — Z113 Encounter for screening for infections with a predominantly sexual mode of transmission: Secondary | ICD-10-CM

## 2021-09-06 NOTE — Progress Notes (Signed)
Pamela [name] presents for NOB nurse interview visit. Pregnancy confirmation done 08/13/21.  [id number]. Pregnancy education material explained and given. Body mass index is 26.06 kg/m. (sickle cell). HIV labs and Drug screen were explained optional and she did not decline. Drug screen ordered. PNV encouraged. Genetic screening options discussed. Genetic testing: Ordered; materniti21.  Pt may discuss with provider.  Financial policy reviewed. FMLA from reviewed and signed. Pt. To follow up with provider in 3 weeks for NOB physical.  All questions answered.

## 2021-09-07 LAB — URINALYSIS, ROUTINE W REFLEX MICROSCOPIC
Bilirubin, UA: NEGATIVE
Glucose, UA: NEGATIVE
Ketones, UA: NEGATIVE
Nitrite, UA: NEGATIVE
Specific Gravity, UA: 1.016 (ref 1.005–1.030)
Urobilinogen, Ur: 0.2 mg/dL (ref 0.2–1.0)
pH, UA: 8 — ABNORMAL HIGH (ref 5.0–7.5)

## 2021-09-07 LAB — MICROSCOPIC EXAMINATION
Casts: NONE SEEN /lpf
RBC, Urine: NONE SEEN /hpf (ref 0–2)

## 2021-09-08 LAB — URINE CULTURE, OB REFLEX

## 2021-09-08 LAB — CULTURE, OB URINE

## 2021-09-09 LAB — MONITOR DRUG PROFILE 14(MW)
Amphetamine Scrn, Ur: NEGATIVE ng/mL
BARBITURATE SCREEN URINE: NEGATIVE ng/mL
BENZODIAZEPINE SCREEN, URINE: NEGATIVE ng/mL
Buprenorphine, Urine: NEGATIVE ng/mL
CANNABINOIDS UR QL SCN: NEGATIVE ng/mL
Cocaine (Metab) Scrn, Ur: NEGATIVE ng/mL
Creatinine(Crt), U: 123.7 mg/dL (ref 20.0–300.0)
Fentanyl, Urine: NEGATIVE pg/mL
Meperidine Screen, Urine: NEGATIVE ng/mL
Methadone Screen, Urine: NEGATIVE ng/mL
OXYCODONE+OXYMORPHONE UR QL SCN: NEGATIVE ng/mL
Opiate Scrn, Ur: NEGATIVE ng/mL
Ph of Urine: 8.1 (ref 4.5–8.9)
Phencyclidine Qn, Ur: NEGATIVE ng/mL
Propoxyphene Scrn, Ur: NEGATIVE ng/mL
SPECIFIC GRAVITY: 1.022
Tramadol Screen, Urine: NEGATIVE ng/mL

## 2021-09-09 LAB — GC/CHLAMYDIA PROBE AMP
Chlamydia trachomatis, NAA: NEGATIVE
Neisseria Gonorrhoeae by PCR: NEGATIVE

## 2021-09-09 LAB — NICOTINE SCREEN, URINE: Cotinine Ql Scrn, Ur: NEGATIVE ng/mL

## 2021-09-10 LAB — HIV ANTIBODY (ROUTINE TESTING W REFLEX): HIV Screen 4th Generation wRfx: NONREACTIVE

## 2021-09-10 LAB — ABO AND RH: Rh Factor: POSITIVE

## 2021-09-10 LAB — TOXOPLASMA ANTIBODIES- IGG AND  IGM
Toxoplasma Antibody- IgM: <3 AU/mL (ref 0.0–7.9)
Toxoplasma IgG Ratio: <3 IU/mL (ref 0.0–7.1)

## 2021-09-10 LAB — VIRAL HEPATITIS HBV, HCV
HCV Ab: 0.2 s/co ratio (ref 0.0–0.9)
Hep B Core Total Ab: NEGATIVE
Hep B Surface Ab, Qual: REACTIVE
Hepatitis B Surface Ag: NEGATIVE

## 2021-09-10 LAB — ANTIBODY SCREEN: Antibody Screen: NEGATIVE

## 2021-09-10 LAB — VARICELLA ZOSTER ANTIBODY, IGG: Varicella zoster IgG: 575 index (ref >165)

## 2021-09-10 LAB — RPR: RPR Ser Ql: NONREACTIVE

## 2021-09-10 LAB — RUBELLA SCREEN: Rubella Antibodies, IGG: 3.58 index (ref >0.99)

## 2021-09-10 LAB — HCV INTERPRETATION

## 2021-09-10 LAB — HGB SOLU + RFLX FRAC: Sickle Solubility Test - HGBRFX: NEGATIVE

## 2021-09-19 ENCOUNTER — Encounter: Payer: Medicaid Other | Admitting: Obstetrics and Gynecology

## 2021-09-20 ENCOUNTER — Telehealth: Payer: Self-pay | Admitting: Obstetrics and Gynecology

## 2021-09-20 ENCOUNTER — Encounter: Payer: Medicaid Other | Admitting: Obstetrics and Gynecology

## 2021-09-20 NOTE — Telephone Encounter (Signed)
MaterniTi 21 was not collected. Called patient to have her come back in for labs only. She was upset but willing to have test redone.

## 2021-09-20 NOTE — Telephone Encounter (Signed)
Pt called she is asking the know the sex of her baby- said she just wants to be told over the phone. Please Advise.

## 2021-09-23 ENCOUNTER — Other Ambulatory Visit: Payer: Medicaid Other

## 2021-09-23 ENCOUNTER — Other Ambulatory Visit: Payer: Self-pay

## 2021-09-23 DIAGNOSIS — Z1379 Encounter for other screening for genetic and chromosomal anomalies: Secondary | ICD-10-CM | POA: Diagnosis not present

## 2021-09-28 LAB — MATERNIT21  PLUS CORE+ESS+SCA, BLOOD

## 2021-09-30 ENCOUNTER — Encounter: Payer: Self-pay | Admitting: Obstetrics and Gynecology

## 2021-09-30 ENCOUNTER — Other Ambulatory Visit: Payer: Self-pay

## 2021-09-30 ENCOUNTER — Ambulatory Visit (INDEPENDENT_AMBULATORY_CARE_PROVIDER_SITE_OTHER): Payer: Medicaid Other | Admitting: Obstetrics and Gynecology

## 2021-09-30 VITALS — BP 109/62 | HR 71 | Wt 172.1 lb

## 2021-09-30 DIAGNOSIS — Z3A15 15 weeks gestation of pregnancy: Secondary | ICD-10-CM

## 2021-09-30 DIAGNOSIS — Z3482 Encounter for supervision of other normal pregnancy, second trimester: Secondary | ICD-10-CM

## 2021-09-30 LAB — POCT URINALYSIS DIPSTICK OB
Bilirubin, UA: NEGATIVE
Blood, UA: NEGATIVE
Glucose, UA: NEGATIVE
Ketones, UA: NEGATIVE
Nitrite, UA: NEGATIVE
POC,PROTEIN,UA: NEGATIVE
Spec Grav, UA: 1.01 (ref 1.010–1.025)
Urobilinogen, UA: 0.2 E.U./dL
pH, UA: 8 (ref 5.0–8.0)

## 2021-09-30 NOTE — Progress Notes (Signed)
ROB: She is doing well, no concerns today. 

## 2021-09-30 NOTE — Progress Notes (Signed)
NOB: Still feels a little lethargic but "happy to be in the second trimester".  AFP today.  Ultrasound scheduled for anatomy.  Exam seems like monilia vaginitis-recommend use of Monistat.  Physical examination General NAD, Conversant  HEENT Atraumatic; Op clear with mmm.  Normo-cephalic. Pupils reactive. Anicteric sclerae  Thyroid/Neck Smooth without nodularity or enlargement. Normal ROM.  Neck Supple.  Skin No rashes, lesions or ulceration. Normal palpated skin turgor. No nodularity.  Breasts: No masses or discharge.  Symmetric.  No axillary adenopathy.  Lungs: Clear to auscultation.No rales or wheezes. Normal Respiratory effort, no retractions.  Heart: NSR.  No murmurs or rubs appreciated. No periferal edema  Abdomen: Soft.  Non-tender.  No masses.  No HSM. No hernia  Extremities: Moves all appropriately.  Normal ROM for age. No lymphadenopathy.  Neuro: Oriented to PPT.  Normal mood. Normal affect.     Pelvic:   Vulva: Normal appearance.  No lesions.  Vagina: No lesions or abnormalities noted.  Thick white vaginal discharge consistent with monilia.  Support: Normal pelvic support.  Urethra No masses tenderness or scarring.  Meatus Normal size without lesions or prolapse.  Cervix: Normal appearance.  No lesions.  Anus: Normal exam.  No lesions.  Perineum: Normal exam.  No lesions.        Bimanual   Adnexae: No masses.  Non-tender to palpation.  Uterus: Enlarged.  15 weeks 153 bpm non-tender.  Mobile.  AV.  Adnexae: No masses.  Non-tender to palpation.  Cul-de-sac: Negative for abnormality.  Adnexae: No masses.  Non-tender to palpation.         Pelvimetry   Diagonal: Reached.  Spines: Average.  Sacrum: Concave.  Pubic Arch: Normal.

## 2021-10-02 LAB — AFP, SERUM, OPEN SPINA BIFIDA
AFP MoM: 1.23
AFP Value: 38.6 ng/mL
Gest. Age on Collection Date: 15.1 weeks
Maternal Age At EDD: 35.1 yr
OSBR Risk 1 IN: 10000
Test Results:: NEGATIVE
Weight: 172 [lb_av]

## 2021-11-06 ENCOUNTER — Encounter: Payer: Self-pay | Admitting: Obstetrics and Gynecology

## 2021-11-06 ENCOUNTER — Other Ambulatory Visit: Payer: Self-pay

## 2021-11-06 ENCOUNTER — Ambulatory Visit (INDEPENDENT_AMBULATORY_CARE_PROVIDER_SITE_OTHER): Payer: Medicaid Other

## 2021-11-06 ENCOUNTER — Ambulatory Visit (INDEPENDENT_AMBULATORY_CARE_PROVIDER_SITE_OTHER): Payer: Medicaid Other | Admitting: Obstetrics and Gynecology

## 2021-11-06 VITALS — BP 107/48 | HR 79 | Wt 179.6 lb

## 2021-11-06 DIAGNOSIS — Z3A15 15 weeks gestation of pregnancy: Secondary | ICD-10-CM | POA: Diagnosis not present

## 2021-11-06 DIAGNOSIS — Z98891 History of uterine scar from previous surgery: Secondary | ICD-10-CM

## 2021-11-06 DIAGNOSIS — O09522 Supervision of elderly multigravida, second trimester: Secondary | ICD-10-CM

## 2021-11-06 DIAGNOSIS — Z3A20 20 weeks gestation of pregnancy: Secondary | ICD-10-CM

## 2021-11-06 DIAGNOSIS — Z3482 Encounter for supervision of other normal pregnancy, second trimester: Secondary | ICD-10-CM

## 2021-11-06 LAB — POCT URINALYSIS DIPSTICK OB
Bilirubin, UA: NEGATIVE
Glucose, UA: NEGATIVE
Ketones, UA: NEGATIVE
Nitrite, UA: NEGATIVE
Spec Grav, UA: 1.01 (ref 1.010–1.025)
Urobilinogen, UA: 0.2 E.U./dL
pH, UA: 8.5 — AB (ref 5.0–8.0)

## 2021-11-06 NOTE — Patient Instructions (Signed)
Second Trimester of Pregnancy °The second trimester of pregnancy is from week 13 through week 27. This is also called months 4 through 6 of pregnancy. This is often the time when you feel your best. °During the second trimester: °Morning sickness is less or has stopped. °You may have more energy. °You may feel hungry more often. °At this time, your unborn baby (fetus) is growing very fast. At the end of the sixth month, the unborn baby may be up to 12 inches long and weigh about 1½ pounds. You will likely start to feel the baby move between 16 and 20 weeks of pregnancy. °Body changes during your second trimester °Your body continues to go through many changes during this time. The changes vary and generally return to normal after the baby is born. °Physical changes °You will gain more weight. °You may start to get stretch marks on your hips, belly (abdomen), and breasts. °Your breasts will grow and may hurt. °Dark spots or blotches may develop on your face. °A dark line from your belly button to the pubic area (linea nigra) may appear. °You may have changes in your hair. °Health changes °You may have headaches. °You may have heartburn. °You may have trouble pooping (constipation). °You may have hemorrhoids or swollen, bulging veins (varicose veins). °Your gums may bleed. °You may pee (urinate) more often. °You may have back pain. °Follow these instructions at home: °Medicines °Take over-the-counter and prescription medicines only as told by your doctor. Some medicines are not safe during pregnancy. °Take a prenatal vitamin that contains at least 600 micrograms (mcg) of folic acid. °Eating and drinking °Eat healthy meals that include: °Fresh fruits and vegetables. °Whole grains. °Good sources of protein, such as meat, eggs, or tofu. °Low-fat dairy products. °Avoid raw meat and unpasteurized juice, milk, and cheese. °You may need to take these actions to prevent or treat trouble pooping: °Drink enough fluids to keep  your pee (urine) pale yellow. °Eat foods that are high in fiber. These include beans, whole grains, and fresh fruits and vegetables. °Limit foods that are high in fat and sugar. These include fried or sweet foods. °Activity °Exercise only as told by your doctor. Most people can do their usual exercise during pregnancy. Try to exercise for 30 minutes at least 5 days a week. °Stop exercising if you have pain or cramps in your belly or lower back. °Do not exercise if it is too hot or too humid, or if you are in a place of great height (high altitude). °Avoid heavy lifting. °If you choose to, you may have sex unless your doctor tells you not to. °Relieving pain and discomfort °Wear a good support bra if your breasts are sore. °Take warm water baths (sitz baths) to soothe pain or discomfort caused by hemorrhoids. Use hemorrhoid cream if your doctor approves. °Rest with your legs raised (elevated) if you have leg cramps or low back pain. °If you develop bulging veins in your legs: °Wear support hose as told by your doctor. °Raise your feet for 15 minutes, 3-4 times a day. °Limit salt in your food. °Safety °Wear your seat belt at all times when you are in a car. °Talk with your doctor if someone is hurting you or yelling at you a lot. °Lifestyle °Do not use hot tubs, steam rooms, or saunas. °Do not douche. Do not use tampons or scented sanitary pads. °Avoid cat litter boxes and soil used by cats. These carry germs that can harm your baby and   can cause a loss of your baby by miscarriage or stillbirth. °Do not use herbal medicines, illegal drugs, or medicines that are not approved by your doctor. Do not drink alcohol. °Do not smoke or use any products that contain nicotine or tobacco. If you need help quitting, ask your doctor. °General instructions °Keep all follow-up visits. This is important. °Ask your doctor about local prenatal classes. °Ask your doctor about the right foods to eat or for help finding a  counselor. °Where to find more information °American Pregnancy Association: americanpregnancy.org °American College of Obstetricians and Gynecologists: www.acog.org °Office on Women's Health: womenshealth.gov/pregnancy °Contact a doctor if: °You have a headache that does not go away when you take medicine. °You have changes in how you see, or you see spots in front of your eyes. °You have mild cramps, pressure, or pain in your lower belly. °You continue to feel like you may vomit (nauseous), you vomit, or you have watery poop (diarrhea). °You have bad-smelling fluid coming from your vagina. °You have pain when you pee or your pee smells bad. °You have very bad swelling of your face, hands, ankles, feet, or legs. °You have a fever. °Get help right away if: °You are leaking fluid from your vagina. °You have spotting or bleeding from your vagina. °You have very bad belly cramping or pain. °You have trouble breathing. °You have chest pain. °You faint. °You have not felt your baby move for the time period told by your doctor. °You have new or increased pain, swelling, or redness in an arm or leg. °Summary °The second trimester of pregnancy is from week 13 through week 27 (months 4 through 6). °Eat healthy meals. °Exercise as told by your doctor. Most people can do their usual exercise during pregnancy. °Do not use herbal medicines, illegal drugs, or medicines that are not approved by your doctor. Do not drink alcohol. °Call your doctor if you get sick or if you notice anything unusual about your pregnancy. °This information is not intended to replace advice given to you by your health care provider. Make sure you discuss any questions you have with your health care provider. °Document Revised: 01/25/2020 Document Reviewed: 12/01/2019 °Elsevier Patient Education © 2022 [employer]. ° °

## 2021-11-06 NOTE — Progress Notes (Signed)
ROB: Doing well, no complaints. Has questions regarding when she will schedule her C-section.  Discussed delivery in 39th week, will discuss official scheduled date around [redacted] weeks gestation. For anatomy scan after today's visit. RTC in 4 weeks.  ?

## 2021-11-06 NOTE — Progress Notes (Signed)
ROB: She is doing well today, no new concerns. 

## 2021-12-06 ENCOUNTER — Encounter: Payer: Medicaid Other | Admitting: Obstetrics and Gynecology

## 2021-12-18 ENCOUNTER — Encounter: Payer: Self-pay | Admitting: Obstetrics and Gynecology

## 2021-12-18 ENCOUNTER — Ambulatory Visit (INDEPENDENT_AMBULATORY_CARE_PROVIDER_SITE_OTHER): Payer: Medicaid Other | Admitting: Obstetrics and Gynecology

## 2021-12-18 VITALS — BP 123/70 | HR 68 | Wt 182.4 lb

## 2021-12-18 DIAGNOSIS — O09522 Supervision of elderly multigravida, second trimester: Secondary | ICD-10-CM

## 2021-12-18 DIAGNOSIS — Z3A26 26 weeks gestation of pregnancy: Secondary | ICD-10-CM

## 2021-12-18 LAB — POCT URINALYSIS DIPSTICK OB
Bilirubin, UA: NEGATIVE
Blood, UA: NEGATIVE
Glucose, UA: NEGATIVE
Ketones, UA: NEGATIVE
Nitrite, UA: NEGATIVE
POC,PROTEIN,UA: NEGATIVE
Spec Grav, UA: 1.015 (ref 1.010–1.025)
Urobilinogen, UA: 0.2 E.U./dL
pH, UA: 7 (ref 5.0–8.0)

## 2021-12-18 NOTE — Progress Notes (Signed)
ROB. Patient states fetal movement with daily pain and pressure. Patient states no questions or concerns at this time.   ?

## 2021-12-18 NOTE — Progress Notes (Signed)
ROB: Complains of umbilical hernia pain but otherwise well.  Taking vitamins as directed.  Desires cesarean delivery.  To schedule at next visit.  1 hour GCT in 2 weeks. ?

## 2021-12-20 ENCOUNTER — Encounter: Payer: Medicaid Other | Admitting: Obstetrics and Gynecology

## 2021-12-31 ENCOUNTER — Encounter: Payer: Medicaid Other | Admitting: Obstetrics and Gynecology

## 2022-01-01 ENCOUNTER — Other Ambulatory Visit: Payer: Medicaid Other

## 2022-01-03 ENCOUNTER — Other Ambulatory Visit: Payer: Medicaid Other

## 2022-01-03 DIAGNOSIS — Z3A26 26 weeks gestation of pregnancy: Secondary | ICD-10-CM | POA: Diagnosis not present

## 2022-01-03 DIAGNOSIS — Z3492 Encounter for supervision of normal pregnancy, unspecified, second trimester: Secondary | ICD-10-CM | POA: Diagnosis not present

## 2022-01-04 LAB — CBC WITH DIFF/PLATELET
Basophils Absolute: 0 10*3/uL (ref 0.0–0.2)
Basos: 0 %
EOS (ABSOLUTE): 0.1 10*3/uL (ref 0.0–0.4)
Eos: 1 %
Hematocrit: 33.9 % — ABNORMAL LOW (ref 34.0–46.6)
Hemoglobin: 11.5 g/dL (ref 11.1–15.9)
Immature Grans (Abs): 0.3 10*3/uL — ABNORMAL HIGH (ref 0.0–0.1)
Immature Granulocytes: 3 %
Lymphocytes Absolute: 1.4 10*3/uL (ref 0.7–3.1)
Lymphs: 18 %
MCH: 30.2 pg (ref 26.6–33.0)
MCHC: 33.9 g/dL (ref 31.5–35.7)
MCV: 89 fL (ref 79–97)
Monocytes Absolute: 0.5 10*3/uL (ref 0.1–0.9)
Monocytes: 6 %
Neutrophils Absolute: 5.6 10*3/uL (ref 1.4–7.0)
Neutrophils: 72 %
Platelets: 229 10*3/uL (ref 150–450)
RBC: 3.81 x10E6/uL (ref 3.77–5.28)
RDW: 12.8 % (ref 11.7–15.4)
WBC: 7.8 10*3/uL (ref 3.4–10.8)

## 2022-01-04 LAB — RPR: RPR Ser Ql: NONREACTIVE

## 2022-01-04 LAB — GLUCOSE TOLERANCE, 1 HOUR: Glucose, 1Hr PP: 107 mg/dL (ref 70–199)

## 2022-01-08 ENCOUNTER — Ambulatory Visit (INDEPENDENT_AMBULATORY_CARE_PROVIDER_SITE_OTHER): Payer: Medicaid Other | Admitting: Obstetrics and Gynecology

## 2022-01-08 ENCOUNTER — Encounter: Payer: Self-pay | Admitting: Obstetrics and Gynecology

## 2022-01-08 ENCOUNTER — Other Ambulatory Visit: Payer: Medicaid Other

## 2022-01-08 VITALS — BP 116/72 | HR 84 | Wt 195.2 lb

## 2022-01-08 DIAGNOSIS — Z23 Encounter for immunization: Secondary | ICD-10-CM

## 2022-01-08 DIAGNOSIS — R252 Cramp and spasm: Secondary | ICD-10-CM

## 2022-01-08 DIAGNOSIS — O09523 Supervision of elderly multigravida, third trimester: Secondary | ICD-10-CM

## 2022-01-08 DIAGNOSIS — O99891 Other specified diseases and conditions complicating pregnancy: Secondary | ICD-10-CM

## 2022-01-08 DIAGNOSIS — Z3A29 29 weeks gestation of pregnancy: Secondary | ICD-10-CM

## 2022-01-08 LAB — POCT URINALYSIS DIPSTICK OB
Bilirubin, UA: NEGATIVE
Glucose, UA: NEGATIVE
Ketones, UA: NEGATIVE
Nitrite, UA: NEGATIVE
POC,PROTEIN,UA: NEGATIVE
Spec Grav, UA: 1.015 (ref 1.010–1.025)
Urobilinogen, UA: 0.2 E.U./dL
pH, UA: 6.5 (ref 5.0–8.0)

## 2022-01-08 NOTE — Progress Notes (Signed)
ROB: Patient reports leg cramps in the evenings. Encouraged adequate hydration, Tums or milk before bed, leg massage prior to bed. Normal glucola . Plans to breastfeed, desires Depo Provera for contraception. For Tdap today, signed blood consent.  Will plan to schedule repeat C/S at 39 weeks (03/17/2022). RTC in 2 weeks.  ? ? ?

## 2022-01-08 NOTE — Progress Notes (Signed)
ROB: She is doing well, she has no new concerns. TDAP/BTC done today. ?

## 2022-01-22 ENCOUNTER — Ambulatory Visit (INDEPENDENT_AMBULATORY_CARE_PROVIDER_SITE_OTHER): Payer: Medicaid Other | Admitting: Obstetrics and Gynecology

## 2022-01-22 ENCOUNTER — Encounter: Payer: Self-pay | Admitting: Obstetrics and Gynecology

## 2022-01-22 ENCOUNTER — Other Ambulatory Visit: Payer: Self-pay

## 2022-01-22 VITALS — BP 114/77 | HR 78 | Wt 198.0 lb

## 2022-01-22 DIAGNOSIS — O09523 Supervision of elderly multigravida, third trimester: Secondary | ICD-10-CM

## 2022-01-22 DIAGNOSIS — R82998 Other abnormal findings in urine: Secondary | ICD-10-CM

## 2022-01-22 DIAGNOSIS — Z3A31 31 weeks gestation of pregnancy: Secondary | ICD-10-CM

## 2022-01-22 LAB — POCT URINALYSIS DIPSTICK OB
Bilirubin, UA: NEGATIVE
Blood, UA: NEGATIVE
Glucose, UA: NEGATIVE
Ketones, UA: NEGATIVE
Nitrite, UA: NEGATIVE
POC,PROTEIN,UA: NEGATIVE
Spec Grav, UA: 1.02 (ref 1.010–1.025)
Urobilinogen, UA: 0.2 E.U./dL
pH, UA: 6.5 (ref 5.0–8.0)

## 2022-01-22 NOTE — Progress Notes (Signed)
ROB. Patient states fetal movement with increased pressure. She states questions about pressure along with baby postioning and the possibility of going into spontaneous labor. Patient states previous concerns regarding leg cramps have resolved. Patient states no questions or concerns at this time.

## 2022-01-22 NOTE — Progress Notes (Signed)
ROB: Complains of occasional pelvic pressure.  Says it was much worse 2 days ago but now feels better.  Urine sent for C&S.  Leg cramps not as bad.  Repeat cesarean delivery previously scheduled.  All questions answered.

## 2022-01-26 LAB — URINE CULTURE

## 2022-02-02 NOTE — Progress Notes (Signed)
Results are c/w UTI - needs Rx: MacroBid 1 PO BID for 7 days

## 2022-02-03 ENCOUNTER — Other Ambulatory Visit: Payer: Self-pay

## 2022-02-03 DIAGNOSIS — B9620 Unspecified Escherichia coli [E. coli] as the cause of diseases classified elsewhere: Secondary | ICD-10-CM

## 2022-02-03 MED ORDER — NITROFURANTOIN MONOHYD MACRO 100 MG PO CAPS: 100.0000 mg | ORAL_CAPSULE | Freq: Two times a day (BID) | ORAL | 0 refills | Status: DC

## 2022-02-03 MED ORDER — PRENATE PIXIE 10-0.6-0.4-200 MG PO CAPS
1.0000 | ORAL_CAPSULE | Freq: Every day | ORAL | 3 refills | Status: DC
Start: 2022-02-03 — End: 2023-01-02

## 2022-02-03 NOTE — Progress Notes (Signed)
Macrobid prescribed for UTI per Dr. Logan Bores and office protocol

## 2022-02-06 ENCOUNTER — Ambulatory Visit (INDEPENDENT_AMBULATORY_CARE_PROVIDER_SITE_OTHER): Payer: Medicaid Other | Admitting: Obstetrics and Gynecology

## 2022-02-06 ENCOUNTER — Encounter: Payer: Self-pay | Admitting: Obstetrics and Gynecology

## 2022-02-06 VITALS — BP 108/61 | HR 77 | Wt 203.9 lb

## 2022-02-06 DIAGNOSIS — Z3A33 33 weeks gestation of pregnancy: Secondary | ICD-10-CM

## 2022-02-06 DIAGNOSIS — Z98891 History of uterine scar from previous surgery: Secondary | ICD-10-CM

## 2022-02-06 DIAGNOSIS — O09523 Supervision of elderly multigravida, third trimester: Secondary | ICD-10-CM

## 2022-02-06 NOTE — Progress Notes (Signed)
ROB: She is doing well, has no new concerns. No urine left at intake.

## 2022-02-06 NOTE — Progress Notes (Signed)
ROB: Doing ok, feels tired.  Has questions about fetal growth and position. RTC in 2-3 weeks for OB visit. For 3rd trimester cultures at that time.

## 2022-02-19 ENCOUNTER — Ambulatory Visit: Payer: Medicaid Other | Admitting: Obstetrics and Gynecology

## 2022-02-19 DIAGNOSIS — Z113 Encounter for screening for infections with a predominantly sexual mode of transmission: Secondary | ICD-10-CM

## 2022-02-19 DIAGNOSIS — Z3A35 35 weeks gestation of pregnancy: Secondary | ICD-10-CM

## 2022-02-19 DIAGNOSIS — O09523 Supervision of elderly multigravida, third trimester: Secondary | ICD-10-CM

## 2022-02-19 DIAGNOSIS — Z3685 Encounter for antenatal screening for Streptococcus B: Secondary | ICD-10-CM

## 2022-02-26 ENCOUNTER — Other Ambulatory Visit (HOSPITAL_COMMUNITY)
Admission: RE | Admit: 2022-02-26 | Discharge: 2022-02-26 | Disposition: A | Discharge status: Home / Self Care | Payer: Medicaid Other | Source: Ambulatory Visit | Attending: Obstetrics and Gynecology | Admitting: Obstetrics and Gynecology

## 2022-02-26 ENCOUNTER — Ambulatory Visit (INDEPENDENT_AMBULATORY_CARE_PROVIDER_SITE_OTHER): Payer: Medicaid Other | Admitting: Obstetrics and Gynecology

## 2022-02-26 ENCOUNTER — Encounter: Payer: Self-pay | Admitting: Obstetrics and Gynecology

## 2022-02-26 VITALS — BP 110/71 | HR 83 | Wt 206.4 lb

## 2022-02-26 DIAGNOSIS — Z3A36 36 weeks gestation of pregnancy: Secondary | ICD-10-CM

## 2022-02-26 DIAGNOSIS — Z3685 Encounter for antenatal screening for Streptococcus B: Secondary | ICD-10-CM | POA: Diagnosis not present

## 2022-02-26 DIAGNOSIS — O26893 Other specified pregnancy related conditions, third trimester: Secondary | ICD-10-CM

## 2022-02-26 DIAGNOSIS — O09523 Supervision of elderly multigravida, third trimester: Secondary | ICD-10-CM | POA: Insufficient documentation

## 2022-02-26 DIAGNOSIS — N898 Other specified noninflammatory disorders of vagina: Secondary | ICD-10-CM | POA: Diagnosis not present

## 2022-02-26 DIAGNOSIS — Z113 Encounter for screening for infections with a predominantly sexual mode of transmission: Secondary | ICD-10-CM

## 2022-02-26 LAB — POCT URINALYSIS DIPSTICK OB
Bilirubin, UA: NEGATIVE
Blood, UA: NEGATIVE
Glucose, UA: NEGATIVE
Ketones, UA: NEGATIVE
Leukocytes, UA: NEGATIVE
Nitrite, UA: NEGATIVE
POC,PROTEIN,UA: NEGATIVE
Spec Grav, UA: 1.01 (ref 1.010–1.025)
Urobilinogen, UA: 0.2 E.U./dL
pH, UA: 5 (ref 5.0–8.0)

## 2022-02-26 NOTE — Progress Notes (Signed)
Pamela [name]. Patient states fetal movement with increased back pain. She states stopping her Macrobid for UTI due to increased back pain, had three days remaining, denies urinary symptoms. GC/CT and GBS cultures ordered. Patient states no questions or concerns at this time.

## 2022-02-26 NOTE — Progress Notes (Signed)
ROB: She remains uncomfortable with left-sided rib pain/musculoskeletal pain.  We have discussed this in some detail.  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  We have also discussed her cesarean delivery and where she would like her scar.  She reports daily fetal movement.  She complains of a vaginal discharge that is relatively asymptomatic.  GC/CT-Nuswab performed, GBS performed.

## 2022-02-28 LAB — CERVICOVAGINAL ANCILLARY ONLY
Bacterial Vaginitis (gardnerella): NEGATIVE
Candida Glabrata: NEGATIVE
Candida Vaginitis: POSITIVE — AB
Chlamydia: NEGATIVE
Comment: NEGATIVE
Comment: NEGATIVE
Comment: NEGATIVE
Comment: NEGATIVE
Comment: NEGATIVE
Comment: NORMAL
Neisseria Gonorrhea: NEGATIVE
Trichomonas: NEGATIVE

## 2022-02-28 LAB — STREP GP B NAA: Strep Gp B NAA: NEGATIVE

## 2022-03-06 ENCOUNTER — Encounter: Payer: Medicaid Other | Admitting: Obstetrics and Gynecology

## 2022-03-06 ENCOUNTER — Encounter: Payer: Self-pay | Admitting: Obstetrics and Gynecology

## 2022-03-06 ENCOUNTER — Ambulatory Visit (INDEPENDENT_AMBULATORY_CARE_PROVIDER_SITE_OTHER): Payer: Medicaid Other | Admitting: Obstetrics and Gynecology

## 2022-03-06 VITALS — BP 123/81 | HR 94 | Wt 203.3 lb

## 2022-03-06 DIAGNOSIS — N3001 Acute cystitis with hematuria: Secondary | ICD-10-CM

## 2022-03-06 DIAGNOSIS — Z3A37 37 weeks gestation of pregnancy: Secondary | ICD-10-CM

## 2022-03-06 DIAGNOSIS — O09523 Supervision of elderly multigravida, third trimester: Secondary | ICD-10-CM | POA: Diagnosis not present

## 2022-03-06 DIAGNOSIS — Z98891 History of uterine scar from previous surgery: Secondary | ICD-10-CM

## 2022-03-06 LAB — POCT URINALYSIS DIPSTICK OB
Bilirubin, UA: NEGATIVE
Glucose, UA: NEGATIVE
Ketones, UA: NEGATIVE
Nitrite, UA: POSITIVE
POC,PROTEIN,UA: NEGATIVE
Spec Grav, UA: 1.01 (ref 1.010–1.025)
Urobilinogen, UA: 0.2 E.U./dL
pH, UA: 6.5 (ref 5.0–8.0)

## 2022-03-06 MED ORDER — CEFUROXIME AXETIL 250 MG PO TABS
250.0000 mg | ORAL_TABLET | Freq: Two times a day (BID) | ORAL | 0 refills | Status: DC
Start: 2022-03-06 — End: 2022-03-20

## 2022-03-06 NOTE — Progress Notes (Signed)
ROB: She is doing well. No new concerns. She has positive nitrates in her urine.

## 2022-03-06 NOTE — Patient Instructions (Signed)
Cesarean Delivery Cesarean birth, or cesarean delivery, is the surgical delivery of a baby through an incision in the abdomen and the uterus. This may be referred to as a C-section. This procedure may be scheduled ahead of time, or it may be done in an emergency situation. Tell a health care provider about: Any allergies you have. All medicines you are taking, including vitamins, herbs, eye drops, creams, and over-the-counter medicines. Any problems you or family members have had with anesthetic medicines. Any bleeding problems you have. Any surgeries you have had. Any medical conditions you have. Whether you or any members of your family have a history of deep vein thrombosis (DVT) or pulmonary embolism (PE). What are the risks? Generally, this is a safe procedure. However, problems may occur, including: Infection. Bleeding. Allergic reactions to medicines. Damage to other structures or organs. Blood clots. Injury to your baby. What happens before the procedure? Medicines Ask your health care provider about: Changing or stopping your regular medicines. This is especially important if you are taking diabetes medicines or blood thinners. Taking medicines such as aspirin and ibuprofen. These medicines can thin your blood. Do not take these medicines unless your health care provider tells you to take them. Taking over-the-counter medicines, vitamins, herbs, and supplements. Tests Depending on the reason for your cesarean delivery, you may have a physical exam or additional testing, such as an ultrasound. You may have your blood or urine tested. General instructions Follow instructions from your health care provider about eating or drinking restrictions. Do not shave your pubic area if you know that you are going to have a cesarean delivery. Shaving before the procedure may increase your risk of infection. Plan to have someone take you home from the hospital. Ask your health care provider  what steps will be taken to prevent infection. These may include: Washing skin with a germ-killing soap. Taking antibiotic medicine. Questions for your health care provider Ask your health care provider about: Your pain management plan. This is especially important if you plan to breastfeed your baby. How long you will be in the hospital after the procedure. Any concerns you may have about receiving blood products, if you need them during the procedure. Cord blood banking, if you plan to collect your baby's umbilical cord blood. You may also want to ask your health care provider: Whether you will be able to hold or breastfeed your baby while you are still in the operating room. Whether your baby can stay with you immediately after the procedure and during your recovery. Whether a family member or a person of your choice can go with you into the operating room and stay with you during the procedure, immediately after the procedure, and during your recovery. What happens during the procedure?  An IV will be inserted into one of your veins. Fluid and medicines, such as antibiotics, will be given before the surgery. Fetal monitors will be placed on your abdomen to check your baby's heart rate. You may be given a warming gown to wear to keep your temperature stable. A catheter may be inserted into your bladder through your urethra. This drains your urine during the procedure. You may be given one or more of the following: A medicine to numb the area (local anesthetic). A medicine to make you fall asleep (general anesthetic). A medicine (regional anesthetic) that is injected into your back or through a small thin tube placed in your back (spinal anesthetic or epidural anesthetic). This numbs everything below the  injection site and allows you to stay awake during your procedure. If this makes you feel nauseous, tell your health care provider. Medicines will be available to help reduce any nausea you  may feel. An incision will be made in your abdomen, and then in your uterus. If you are awake during your procedure, you may feel tugging and pulling in your abdomen, but you should not feel pain. If you feel pain, tell your health care provider immediately. Your baby will be removed from your uterus. You may feel more pressure or pushing while this happens. Immediately after birth, your baby will be dried and kept warm. You may be able to hold and breastfeed your baby. The umbilical cord may be clamped and cut during this time. This usually occurs after waiting a period of 1-2 minutes after delivery. Your placenta will be removed from your uterus. Your incisions will be closed with stitches (sutures). Staples, skin glue, or adhesive strips may also be applied to the incision in your abdomen. Bandages (dressings) may be placed over the incision in your abdomen. The procedure may vary among health care providers and hospitals. What happens after the procedure? Your blood pressure, heart rate, breathing rate, and blood oxygen level will be monitored until you leave the hospital or clinic. You may continue to receive fluids and medicines through an IV. You will have some pain. Medicines will be available to help control your pain. To help prevent blood clots: You may be given medicines. You may have to wear compression stockings or devices. You will be encouraged to walk around when you are able. Hospital staff will encourage and support bonding with your baby. Your hospital may have you and your baby stay in the same room (rooming in) during your hospital stay to encourage successful bonding and breastfeeding. You may be encouraged to cough and breathe deeply often. This helps to prevent lung problems. If you have a catheter draining your urine, it will be removed as soon as possible after your procedure. Summary Cesarean birth, or cesarean delivery, is the surgical delivery of a baby through an  incision in the abdomen and the uterus. Follow instructions from your health care provider about eating or drinking restrictions before the procedure. You will have some pain after the procedure. Medicines will be available to help control your pain. Hospital staff will encourage and support bonding with your baby after the procedure. Your hospital may have you and your baby stay in the same room (rooming in) during your hospital stay to encourage successful bonding and breastfeeding. This information is not intended to replace advice given to you by your health care provider. Make sure you discuss any questions you have with your health care provider. Document Revised: 03/20/2021 Document Reviewed: 03/20/2021 Elsevier Patient Education  2023 [employer]  

## 2022-03-06 NOTE — Progress Notes (Signed)
ROB: Patient reports that she is tired of being pregnant.  Otherwise has no major concerns.  UA today notes positive nitrates, patient with recent UTI several weeks ago, was partially treated with Macrobid.  Patient stopped medication as she thought it was causing worsening back pain.  Will prescribe cefuroxime today as last urine culture noted and sensitivities to several antibiotics.  Patient scheduled for preadmission testing tomorrow for her C-section that is scheduled for 7/17.  RTC in 1 week.

## 2022-03-07 ENCOUNTER — Other Ambulatory Visit: Payer: Self-pay

## 2022-03-07 ENCOUNTER — Encounter
Admission: RE | Admit: 2022-03-07 | Discharge: 2022-03-07 | Disposition: A | Discharge status: Home / Self Care | Payer: Medicaid Other | Source: Ambulatory Visit | Attending: Obstetrics and Gynecology | Admitting: Obstetrics and Gynecology

## 2022-03-07 DIAGNOSIS — Z98891 History of uterine scar from previous surgery: Secondary | ICD-10-CM | POA: Insufficient documentation

## 2022-03-07 DIAGNOSIS — Z01812 Encounter for preprocedural laboratory examination: Secondary | ICD-10-CM | POA: Diagnosis not present

## 2022-03-07 LAB — TYPE AND SCREEN
ABO/RH(D): A POS
Antibody Screen: NEGATIVE
Extend sample reason: UNDETERMINED

## 2022-03-07 NOTE — Patient Instructions (Addendum)
Your procedure is scheduled on: 03/17/22 Report to [facility] located on the 3rd floor of the hospital Arrive at 5:30 am and enter thru the Emergency Department   Remember: Instructions that are not followed completely may result in serious medical risk, up to and including death, or upon the discretion of your surgeon and anesthesiologist your surgery may need to be rescheduled.     _X__ 1. Do not eat food after midnight the night before your procedure.                 No gum chewing or hard candies. You may drink clear liquids up to 2 hours                 before you are scheduled to arrive for your surgery- DO not drink clear                 liquids within 2 hours of the start of your surgery.                 Clear Liquids include:  water, apple juice without pulp, clear carbohydrate                 drink such as Clearfast or Gatorade, Black Coffee or Tea (Do not add                 anything to coffee or tea). Diabetics water only  __X__2.  On the morning of surgery brush your teeth with toothpaste and water, you                 may rinse your mouth with mouthwash if you wish.  Do not swallow any              toothpaste of mouthwash.     _X__ 3.  No Alcohol for 24 hours before or after surgery.   _X__ 4.  Do Not Smoke or use e-cigarettes For 24 Hours Prior to Your Surgery.                 Do not use any chewable tobacco products for at least 6 hours prior to                 surgery.  ____  5.  Bring all medications with you on the day of surgery if instructed.   __X__  6.  Notify your doctor if there is any change in your medical condition      (cold, fever, infections).     Do not wear jewelry, make-up, hairpins, clips or nail polish. Do not wear lotions, powders, or perfumes.  Do not shave 48 hours prior to surgery. Men may shave face and neck. Do not bring valuables to the hospital.    [facility] is not responsible for any belongings or valuables.  Contacts,  dentures/partials or body piercings may not be worn into surgery. Bring a case for your contacts, glasses or hearing aids, a denture cup will be supplied. Leave your suitcase in the car. After surgery it may be brought to your room. For patients admitted to the hospital, discharge time is determined by your treatment team.   Patients discharged the day of surgery will not be allowed to drive home.   Please read over the following fact sheets that you were given:   Chg soap  __X__ Take these medicines the morning of surgery with A SIP OF WATER:    1. none  2.  3.   4.  5.  6.  ____ Fleet Enema (as directed)   __X__ Use CHG Soap/SAGE wipes as directed  ____ Use inhalers on the day of surgery  ____ Stop metformin/Janumet/Farxiga 2 days prior to surgery    ____ Take 1/2 of usual insulin dose the night before surgery. No insulin the morning          of surgery.   ____ Stop Blood Thinners Coumadin/Plavix/Xarelto/Pleta/Pradaxa/Eliquis/Effient/Aspirin  on   Or contact your Surgeon, Cardiologist or Medical Doctor regarding  ability to stop your blood thinners  __X__ Stop Anti-inflammatories 7 days before surgery such as Advil, Ibuprofen, Motrin,  BC or Goodies Powder, Naprosyn, Naproxen, Aleve, Aspirin    __X__ Stop all herbal supplements, fish oil or vitamin E until after surgery.    ____ Bring C-Pap to the hospital.

## 2022-03-08 LAB — RPR: RPR Ser Ql: NONREACTIVE

## 2022-03-09 LAB — URINE CULTURE

## 2022-03-11 ENCOUNTER — Encounter: Payer: Self-pay | Admitting: Obstetrics and Gynecology

## 2022-03-11 ENCOUNTER — Ambulatory Visit (INDEPENDENT_AMBULATORY_CARE_PROVIDER_SITE_OTHER): Payer: Medicaid Other | Admitting: Obstetrics and Gynecology

## 2022-03-11 VITALS — BP 129/79 | HR 80 | Wt 206.0 lb

## 2022-03-11 DIAGNOSIS — Z3A38 38 weeks gestation of pregnancy: Secondary | ICD-10-CM

## 2022-03-11 DIAGNOSIS — O09523 Supervision of elderly multigravida, third trimester: Secondary | ICD-10-CM

## 2022-03-11 LAB — POCT URINALYSIS DIPSTICK OB
Bilirubin, UA: NEGATIVE
Blood, UA: NEGATIVE
Glucose, UA: NEGATIVE
Ketones, UA: NEGATIVE
Nitrite, UA: NEGATIVE
POC,PROTEIN,UA: NEGATIVE
Spec Grav, UA: <=1.005 — AB (ref 1.010–1.025)
Urobilinogen, UA: 0.2 E.U./dL
pH, UA: 6.5 (ref 5.0–8.0)

## 2022-03-11 NOTE — Progress Notes (Signed)
ROB: No problems.  Noting improvement in symptoms since changing antibiotics.  Cesarean scheduled for Monday.  Cesarean discussed-questions answered.

## 2022-03-11 NOTE — Progress Notes (Signed)
Pamela [name]. Patient states fetal movement with occasional braxton hicks. She states urinary symptoms are improving with medication. Patient states no questions or concerns at this time.

## 2022-03-16 NOTE — Anesthesia Preprocedure Evaluation (Signed)
Anesthesia Evaluation  Patient identified by MRN, date of birth, ID band Patient awake    Reviewed: Allergy & Precautions, NPO status , Patient's Chart, lab work & pertinent test results  Airway Mallampati: III  TM Distance: >3 FB Neck ROM: full    Dental no notable dental hx.    Pulmonary neg pulmonary ROS,    Pulmonary exam normal        Cardiovascular Exercise Tolerance: Good negative cardio ROS Normal cardiovascular exam     Neuro/Psych    GI/Hepatic negative GI ROS,   Endo/Other    Renal/GU   negative genitourinary   Musculoskeletal   Abdominal Normal abdominal exam  (+)   Peds  Hematology negative hematology ROS (+)   Anesthesia Other Findings History of 2 cesarean sections  Past Medical History: No date: Headache No date: History of recurrent UTIs No date: Panic attacks No date: Umbilical hernia No date: Umbilical hernia  Past Surgical History: 12/01/2008: CESAREAN SECTION 07/22/2017: CESAREAN SECTION; N/A     Comment:  Procedure: REPEAT CESAREAN SECTION (Female, 0948, 9lbs               11oz);  Surgeon: Linzie Collin, MD;  Location: [facility]               ORS;  Service: Obstetrics;  Laterality: N/A; No date: DILATION AND CURETTAGE OF UTERUS 08/07/2015: DILATION AND EVACUATION; N/A     Comment:  Procedure: DILATATION AND EVACUATION;  Surgeon: Allie Bossier, MD;  Location: [facility] ORS;  Service: Gynecology;                Laterality: N/A; 07/08/2019: DILATION AND EVACUATION; N/A     Comment:  Procedure: DILATATION AND EVACUATION;  Surgeon: Hildred Laser, MD;  Location: [facility] ORS;  Service: Gynecology;                Laterality: N/A;     Reproductive/Obstetrics (+) Pregnancy                            Anesthesia Physical Anesthesia Plan  ASA: 2  Anesthesia Plan: Spinal   Post-op Pain Management: Minimal or no pain anticipated   Induction:  Intravenous  PONV Risk Score and Plan: Ondansetron  Airway Management Planned: Natural Airway  Additional Equipment:   Intra-op Plan:   Post-operative Plan:   Informed Consent: I have reviewed the patients History and Physical, chart, labs and discussed the procedure including the risks, benefits and alternatives for the proposed anesthesia with the patient or authorized representative who has indicated his/her understanding and acceptance.     Dental Advisory Given  Plan Discussed with: Anesthesiologist  Anesthesia Plan Comments:        Anesthesia Quick Evaluation

## 2022-03-17 ENCOUNTER — Inpatient Hospital Stay: Payer: Medicaid Other | Admitting: Anesthesiology

## 2022-03-17 ENCOUNTER — Inpatient Hospital Stay
Admission: RE | Admit: 2022-03-17 | Discharge: 2022-03-20 | DRG: 788 | Disposition: A | Discharge status: Home / Self Care | Payer: Medicaid Other | Source: Ambulatory Visit | Attending: Obstetrics and Gynecology | Admitting: Obstetrics and Gynecology

## 2022-03-17 ENCOUNTER — Encounter
Admission: RE | Disposition: A | Discharge status: Home / Self Care | Payer: Self-pay | Source: Ambulatory Visit | Attending: Obstetrics and Gynecology

## 2022-03-17 ENCOUNTER — Other Ambulatory Visit: Payer: Self-pay

## 2022-03-17 ENCOUNTER — Encounter: Payer: Self-pay | Admitting: Obstetrics and Gynecology

## 2022-03-17 DIAGNOSIS — Z98891 History of uterine scar from previous surgery: Secondary | ICD-10-CM

## 2022-03-17 DIAGNOSIS — Z3A39 39 weeks gestation of pregnancy: Secondary | ICD-10-CM | POA: Diagnosis not present

## 2022-03-17 DIAGNOSIS — O34211 Maternal care for low transverse scar from previous cesarean delivery: Secondary | ICD-10-CM | POA: Diagnosis not present

## 2022-03-17 DIAGNOSIS — O34219 Maternal care for unspecified type scar from previous cesarean delivery: Secondary | ICD-10-CM | POA: Diagnosis not present

## 2022-03-17 DIAGNOSIS — Z3A00 Weeks of gestation of pregnancy not specified: Secondary | ICD-10-CM | POA: Diagnosis not present

## 2022-03-17 DIAGNOSIS — Z87440 Personal history of urinary (tract) infections: Secondary | ICD-10-CM | POA: Diagnosis not present

## 2022-03-17 LAB — CBC
HCT: 33.5 % — ABNORMAL LOW (ref 36.0–46.0)
Hemoglobin: 10.7 g/dL — ABNORMAL LOW (ref 12.0–15.0)
MCH: 27.7 pg (ref 26.0–34.0)
MCHC: 31.9 g/dL (ref 30.0–36.0)
MCV: 86.8 fL (ref 80.0–100.0)
Platelets: 218 10*3/uL (ref 150–400)
RBC: 3.86 MIL/uL — ABNORMAL LOW (ref 3.87–5.11)
RDW: 14.6 % (ref 11.5–15.5)
WBC: 8.8 10*3/uL (ref 4.0–10.5)
nRBC: 0 % (ref 0.0–0.2)

## 2022-03-17 LAB — TYPE AND SCREEN
ABO/RH(D): A POS
Antibody Screen: NEGATIVE

## 2022-03-17 SURGERY — Surgical Case
Anesthesia: Spinal

## 2022-03-17 MED ORDER — ONDANSETRON HCL 4 MG/2ML IJ SOLN
INTRAMUSCULAR | Status: DC | PRN
  Administered 2022-03-17: 4 mg via INTRAVENOUS

## 2022-03-17 MED ORDER — GABAPENTIN 300 MG PO CAPS
300.0000 mg | ORAL_CAPSULE | ORAL | Status: AC
  Administered 2022-03-17: 300 mg via ORAL
  Filled 2022-03-17: qty 1

## 2022-03-17 MED ORDER — CEFAZOLIN SODIUM-DEXTROSE 2-4 GM/100ML-% IV SOLN
2.0000 g | INTRAVENOUS | Status: AC
  Administered 2022-03-17: 2 g via INTRAVENOUS
  Filled 2022-03-17: qty 100

## 2022-03-17 MED ORDER — DIBUCAINE (PERIANAL) 1 % EX OINT: 1.0000 | TOPICAL_OINTMENT | CUTANEOUS | Status: DC | PRN

## 2022-03-17 MED ORDER — KETOROLAC TROMETHAMINE 30 MG/ML IJ SOLN
30.0000 mg | Freq: Four times a day (QID) | INTRAMUSCULAR | Status: DC | PRN
  Administered 2022-03-17: 30 mg via INTRAVENOUS
  Filled 2022-03-17: qty 1

## 2022-03-17 MED ORDER — SIMETHICONE 80 MG PO CHEW: 80.0000 mg | CHEWABLE_TABLET | ORAL | Status: DC | PRN

## 2022-03-17 MED ORDER — SOD CITRATE-CITRIC ACID 500-334 MG/5ML PO SOLN: 30.0000 mL | ORAL | Status: AC

## 2022-03-17 MED ORDER — MAGNESIUM HYDROXIDE 400 MG/5ML PO SUSP: 30.0000 mL | ORAL | Status: DC | PRN

## 2022-03-17 MED ORDER — OXYTOCIN-SODIUM CHLORIDE 30-0.9 UT/500ML-% IV SOLN
2.5000 [IU]/h | INTRAVENOUS | Status: AC
Start: 2022-03-17 — End: 2022-03-18
  Administered 2022-03-17: 2.5 [IU]/h via INTRAVENOUS

## 2022-03-17 MED ORDER — DIPHENHYDRAMINE HCL 25 MG PO CAPS: 25.0000 mg | ORAL_CAPSULE | ORAL | Status: DC | PRN

## 2022-03-17 MED ORDER — GABAPENTIN 300 MG PO CAPS
300.0000 mg | ORAL_CAPSULE | Freq: Two times a day (BID) | ORAL | Status: DC
  Administered 2022-03-17 – 2022-03-20 (×6): 300 mg via ORAL
  Filled 2022-03-17 (×7): qty 1

## 2022-03-17 MED ORDER — SENNOSIDES-DOCUSATE SODIUM 8.6-50 MG PO TABS
2.0000 | ORAL_TABLET | Freq: Every day | ORAL | Status: DC
  Administered 2022-03-18 – 2022-03-20 (×3): 2 via ORAL
  Filled 2022-03-17 (×3): qty 2

## 2022-03-17 MED ORDER — MORPHINE SULFATE (PF) 0.5 MG/ML IJ SOLN
INTRAMUSCULAR | Status: AC
  Filled 2022-03-17: qty 10

## 2022-03-17 MED ORDER — OXYCODONE HCL 5 MG PO TABS: 5.0000 mg | ORAL_TABLET | Freq: Four times a day (QID) | ORAL | Status: DC | PRN

## 2022-03-17 MED ORDER — NALOXONE HCL 4 MG/10ML IJ SOLN: 1.0000 ug/kg/h | INTRAVENOUS | Status: DC | PRN

## 2022-03-17 MED ORDER — POVIDONE-IODINE 10 % EX SWAB
2.0000 | Freq: Once | CUTANEOUS | Status: AC
  Administered 2022-03-17: 2 via TOPICAL

## 2022-03-17 MED ORDER — ZOLPIDEM TARTRATE 5 MG PO TABS: 5.0000 mg | ORAL_TABLET | Freq: Every evening | ORAL | Status: DC | PRN

## 2022-03-17 MED ORDER — MENTHOL 3 MG MT LOZG: 1.0000 | LOZENGE | OROMUCOSAL | Status: DC | PRN

## 2022-03-17 MED ORDER — OXYTOCIN-SODIUM CHLORIDE 30-0.9 UT/500ML-% IV SOLN
INTRAVENOUS | Status: DC | PRN
  Administered 2022-03-17: 250 mL/h via INTRAVENOUS

## 2022-03-17 MED ORDER — SOD CITRATE-CITRIC ACID 500-334 MG/5ML PO SOLN
ORAL | Status: AC
  Administered 2022-03-17: 30 mL via ORAL
  Filled 2022-03-17: qty 15

## 2022-03-17 MED ORDER — KETOROLAC TROMETHAMINE 30 MG/ML IJ SOLN: 30.0000 mg | Freq: Four times a day (QID) | INTRAMUSCULAR | Status: DC | PRN

## 2022-03-17 MED ORDER — LACTATED RINGERS IV SOLN: INTRAVENOUS | Status: DC

## 2022-03-17 MED ORDER — KETOROLAC TROMETHAMINE 30 MG/ML IJ SOLN
30.0000 mg | Freq: Four times a day (QID) | INTRAMUSCULAR | Status: AC
  Administered 2022-03-17 – 2022-03-18 (×4): 30 mg via INTRAVENOUS
  Filled 2022-03-17 (×4): qty 1

## 2022-03-17 MED ORDER — DEXAMETHASONE SODIUM PHOSPHATE 10 MG/ML IJ SOLN
INTRAMUSCULAR | Status: DC | PRN
  Administered 2022-03-17: 10 mg via INTRAVENOUS

## 2022-03-17 MED ORDER — IBUPROFEN 600 MG PO TABS
600.0000 mg | ORAL_TABLET | Freq: Four times a day (QID) | ORAL | Status: DC
  Administered 2022-03-18 – 2022-03-19 (×3): 600 mg via ORAL
  Filled 2022-03-17 (×3): qty 1

## 2022-03-17 MED ORDER — WITCH HAZEL-GLYCERIN EX PADS: 1.0000 | MEDICATED_PAD | CUTANEOUS | Status: DC | PRN

## 2022-03-17 MED ORDER — ACETAMINOPHEN 500 MG PO TABS
1000.0000 mg | ORAL_TABLET | ORAL | Status: DC
  Filled 2022-03-17: qty 2

## 2022-03-17 MED ORDER — OXYCODONE HCL 5 MG PO TABS
5.0000 mg | ORAL_TABLET | ORAL | Status: DC | PRN
  Administered 2022-03-18: 5 mg via ORAL
  Administered 2022-03-19: 10 mg via ORAL
  Filled 2022-03-17: qty 2
  Filled 2022-03-17: qty 1

## 2022-03-17 MED ORDER — COCONUT OIL OIL: 1.0000 | TOPICAL_OIL | Status: DC | PRN

## 2022-03-17 MED ORDER — ONDANSETRON HCL 4 MG/2ML IJ SOLN
INTRAMUSCULAR | Status: AC
  Filled 2022-03-17: qty 2

## 2022-03-17 MED ORDER — SODIUM CHLORIDE 0.9% FLUSH: 3.0000 mL | INTRAVENOUS | Status: DC | PRN

## 2022-03-17 MED ORDER — MORPHINE SULFATE (PF) 0.5 MG/ML IJ SOLN
INTRAMUSCULAR | Status: DC | PRN
  Administered 2022-03-17: 200 ug via INTRATHECAL

## 2022-03-17 MED ORDER — CHLORHEXIDINE GLUCONATE 0.12 % MT SOLN
OROMUCOSAL | Status: AC
  Filled 2022-03-17: qty 15

## 2022-03-17 MED ORDER — DEXAMETHASONE SODIUM PHOSPHATE 10 MG/ML IJ SOLN
INTRAMUSCULAR | Status: AC
  Filled 2022-03-17: qty 1

## 2022-03-17 MED ORDER — FERROUS SULFATE 325 (65 FE) MG PO TABS
325.0000 mg | ORAL_TABLET | ORAL | Status: DC
  Administered 2022-03-18 – 2022-03-20 (×2): 325 mg via ORAL
  Filled 2022-03-17 (×2): qty 1

## 2022-03-17 MED ORDER — OXYTOCIN-SODIUM CHLORIDE 30-0.9 UT/500ML-% IV SOLN
INTRAVENOUS | Status: AC
  Filled 2022-03-17: qty 1000

## 2022-03-17 MED ORDER — SCOPOLAMINE 1 MG/3DAYS TD PT72: 1.0000 | MEDICATED_PATCH | Freq: Once | TRANSDERMAL | Status: DC

## 2022-03-17 MED ORDER — BUPIVACAINE-MELOXICAM ER 400-12 MG/14ML IJ SOLN
INTRAMUSCULAR | Status: AC
  Filled 2022-03-17: qty 1

## 2022-03-17 MED ORDER — BUPIVACAINE IN DEXTROSE 0.75-8.25 % IT SOLN
INTRATHECAL | Status: DC | PRN
  Administered 2022-03-17: 1.6 mL via INTRATHECAL

## 2022-03-17 MED ORDER — ACETAMINOPHEN 500 MG PO TABS
1000.0000 mg | ORAL_TABLET | Freq: Four times a day (QID) | ORAL | Status: DC
  Administered 2022-03-17: 1000 mg via ORAL
  Filled 2022-03-17: qty 2

## 2022-03-17 MED ORDER — ACETAMINOPHEN 500 MG PO TABS
1000.0000 mg | ORAL_TABLET | Freq: Four times a day (QID) | ORAL | Status: DC
  Administered 2022-03-17: 1000 mg via ORAL
  Filled 2022-03-17 (×2): qty 2

## 2022-03-17 MED ORDER — PHENYLEPHRINE HCL-NACL 20-0.9 MG/250ML-% IV SOLN
INTRAVENOUS | Status: AC
  Filled 2022-03-17: qty 250

## 2022-03-17 MED ORDER — PHENYLEPHRINE 80 MCG/ML (10ML) SYRINGE FOR IV PUSH (FOR BLOOD PRESSURE SUPPORT)
PREFILLED_SYRINGE | INTRAVENOUS | Status: DC | PRN
  Administered 2022-03-17 (×2): 160 ug via INTRAVENOUS

## 2022-03-17 MED ORDER — ONDANSETRON HCL 4 MG/2ML IJ SOLN: 4.0000 mg | Freq: Three times a day (TID) | INTRAMUSCULAR | Status: DC | PRN

## 2022-03-17 MED ORDER — HYDROMORPHONE HCL 1 MG/ML IJ SOLN: 1.0000 mg | INTRAMUSCULAR | Status: DC | PRN

## 2022-03-17 MED ORDER — OXYCODONE-ACETAMINOPHEN 5-325 MG PO TABS
2.0000 | ORAL_TABLET | ORAL | Status: DC | PRN
  Administered 2022-03-19 (×2): 2 via ORAL
  Filled 2022-03-17 (×2): qty 2

## 2022-03-17 MED ORDER — NALOXONE HCL 0.4 MG/ML IJ SOLN: 0.4000 mg | INTRAMUSCULAR | Status: DC | PRN

## 2022-03-17 MED ORDER — PRENATAL MULTIVITAMIN CH
1.0000 | ORAL_TABLET | Freq: Every day | ORAL | Status: DC
  Administered 2022-03-17 – 2022-03-20 (×4): 1 via ORAL
  Filled 2022-03-17 (×4): qty 1

## 2022-03-17 MED ORDER — PHENYLEPHRINE HCL-NACL 20-0.9 MG/250ML-% IV SOLN
INTRAVENOUS | Status: DC | PRN
  Administered 2022-03-17: 40 ug/min via INTRAVENOUS

## 2022-03-17 MED ORDER — DIPHENHYDRAMINE HCL 25 MG PO CAPS
25.0000 mg | ORAL_CAPSULE | Freq: Four times a day (QID) | ORAL | Status: DC | PRN
  Filled 2022-03-17: qty 1

## 2022-03-17 MED ORDER — MEPERIDINE HCL 25 MG/ML IJ SOLN: 6.2500 mg | INTRAMUSCULAR | Status: DC | PRN

## 2022-03-17 MED ORDER — PHENYLEPHRINE 80 MCG/ML (10ML) SYRINGE FOR IV PUSH (FOR BLOOD PRESSURE SUPPORT)
PREFILLED_SYRINGE | INTRAVENOUS | Status: AC
  Filled 2022-03-17: qty 10

## 2022-03-17 MED ORDER — BUPIVACAINE-MELOXICAM ER 200-6 MG/7ML IJ SOLN
INTRAMUSCULAR | Status: DC | PRN
  Administered 2022-03-17: 400 mg

## 2022-03-17 MED ORDER — DIPHENHYDRAMINE HCL 50 MG/ML IJ SOLN: 12.5000 mg | INTRAMUSCULAR | Status: DC | PRN

## 2022-03-17 SURGICAL SUPPLY — 31 items
BAG COUNTER SPONGE SURGICOUNT (BAG) ×2 IMPLANT
CHLORAPREP W/TINT 26 (MISCELLANEOUS) ×4 IMPLANT
DERMABOND ADVANCED (GAUZE/BANDAGES/DRESSINGS) ×1
DERMABOND ADVANCED .7 DNX12 (GAUZE/BANDAGES/DRESSINGS) IMPLANT
DRSG OPSITE POSTOP 4X10 (GAUZE/BANDAGES/DRESSINGS) ×1 IMPLANT
DRSG TELFA 3X8 NADH (GAUZE/BANDAGES/DRESSINGS) ×2 IMPLANT
ELECT REM PT RETURN 9FT ADLT (ELECTROSURGICAL) ×2
ELECTRODE REM PT RTRN 9FT ADLT (ELECTROSURGICAL) ×1 IMPLANT
EXTRT SYSTEM ALEXIS 17CM (MISCELLANEOUS)
GAUZE SPONGE 4X4 12PLY STRL (GAUZE/BANDAGES/DRESSINGS) ×2 IMPLANT
GLOVE BIO SURGEON STRL SZ 6.5 (GLOVE) ×2 IMPLANT
GLOVE SURG UNDER LTX SZ7 (GLOVE) ×2 IMPLANT
GOWN STRL REUS W/ TWL LRG LVL3 (GOWN DISPOSABLE) ×2 IMPLANT
GOWN STRL REUS W/TWL LRG LVL3 (GOWN DISPOSABLE) ×2
KIT TURNOVER KIT A (KITS) ×2 IMPLANT
MANIFOLD NEPTUNE II (INSTRUMENTS) ×2 IMPLANT
MAT PREVALON FULL STRYKER (MISCELLANEOUS) ×2 IMPLANT
NS IRRIG 1000ML POUR BTL (IV SOLUTION) ×2 IMPLANT
PACK C SECTION AR (MISCELLANEOUS) ×2 IMPLANT
PAD DRESSING TELFA 3X8 NADH (GAUZE/BANDAGES/DRESSINGS) ×1 IMPLANT
PAD OB MATERNITY 4.3X12.25 (PERSONAL CARE ITEMS) ×2 IMPLANT
PAD PREP 24X41 OB/GYN DISP (PERSONAL CARE ITEMS) ×2 IMPLANT
RTRCTR C-SECT PINK 34CM XLRG (MISCELLANEOUS) ×1 IMPLANT
SCRUB CHG 4% DYNA-HEX 4OZ (MISCELLANEOUS) ×2 IMPLANT
SUT MNCRL AB 4-0 PS2 18 (SUTURE) ×2 IMPLANT
SUT PLAIN 2 0 XLH (SUTURE) IMPLANT
SUT VIC AB 0 CT1 36 (SUTURE) ×8 IMPLANT
SUT VIC AB 3-0 SH 27 (SUTURE) ×1
SUT VIC AB 3-0 SH 27X BRD (SUTURE) ×1 IMPLANT
SYSTEM CONTND EXTRCTN KII BLLN (MISCELLANEOUS) IMPLANT
WATER STERILE IRR 500ML POUR (IV SOLUTION) ×2 IMPLANT

## 2022-03-17 NOTE — Progress Notes (Signed)
   03/17/22 0607  Fetal Heart Rate A  Mode External  Baseline Rate (A) 140 bpm  Variability 6-25 BPM  Accelerations 15 x 15  Decelerations None    Category 1 NST.

## 2022-03-17 NOTE — Anesthesia Procedure Notes (Signed)
Spinal  Patient location during procedure: OR Start time: 03/17/2022 7:40 AM End time: 03/17/2022 7:50 AM Reason for block: surgical anesthesia Staffing Performed: resident/CRNA  Resident/CRNA: Katherine Basset, CRNA Performed by: Katherine Basset, CRNA Authorized by: Foye Deer, MD   Preanesthetic Checklist Completed: patient identified, IV checked, site marked, risks and benefits discussed, surgical consent, monitors and equipment checked, pre-op evaluation and timeout performed Spinal Block Patient position: sitting Prep: DuraPrep Patient monitoring: heart rate, continuous pulse ox and blood pressure Approach: midline Location: L3-4 Injection technique: single-shot Needle Needle type: Sprotte  Needle gauge: 24 G Needle length: 9 cm Assessment Sensory level: T4 Events: CSF return

## 2022-03-17 NOTE — Transfer of Care (Signed)
Immediate Anesthesia Transfer of Care Note  Patient: Pamela [name]  Procedure(s) Performed: CESAREAN SECTION  Patient Location: Mother/Baby  Anesthesia Type:Spinal  Level of Consciousness: awake, alert  and oriented  Airway & Oxygen Therapy: Patient Spontanous Breathing  Post-op Assessment: Report given to RN and Post -op Vital signs reviewed and stable  Post vital signs: Reviewed and stable  Last Vitals:  Vitals Value Taken Time  BP    Temp    Pulse    Resp 17 03/17/22 0909  SpO2    Vitals shown include unvalidated device data.  Last Pain:  Vitals:   03/17/22 0601  TempSrc:   PainSc: 0-No pain         Complications: No notable events documented.

## 2022-03-17 NOTE — H&P (Signed)
Obstetric Preoperative History and Physical  Pamela [name] is a 36 y.o. [id number] with IUP at [redacted]w[redacted]d presenting for presenting for scheduled repeat cesarean section. History of previous C-section x 2, declines vaginal attempt.  No acute concerns.   Prenatal Course Source of Care: [facility] with onset of care at 11 weeks Pregnancy complications or risks: Patient Active Problem List   Diagnosis Date Noted   Acute cystitis with hematuria 03/06/2022   Nipple discharge in female 07/23/2018   Umbilical hernia 01/28/2017   History of 2 cesarean sections 01/28/2017   She plans to breastfeed She desires Depo-Provera for postpartum contraception.   Prenatal labs and studies: ABO, Rh: --/--/PENDING (07/17 9379) Antibody: PENDING (07/17 0618) Rubella: 3.58 (01/06 0942) RPR: NON REACTIVE (07/07 1428)  HBsAg: Negative (01/06 0942)  HIV: Non Reactive (01/06 0942)  KWI:OXBDZHGD/-- (06/28 1655) 1 hr Glucola  normal Genetic screening normal Anatomy US normal   Past Medical History:  Diagnosis Date   Headache    History of recurrent UTIs    Panic attacks    Umbilical hernia    Umbilical hernia     Past Surgical History:  Procedure Laterality Date   CESAREAN SECTION  12/01/2008   CESAREAN SECTION N/A 07/22/2017   Procedure: REPEAT CESAREAN SECTION (Female, 0948, 9lbs 11oz);  Surgeon: Linzie Collin, MD;  Location: [facility] ORS;  Service: Obstetrics;  Laterality: N/A;   DILATION AND CURETTAGE OF UTERUS     DILATION AND EVACUATION N/A 08/07/2015   Procedure: DILATATION AND EVACUATION;  Surgeon: Allie Bossier, MD;  Location: [facility]S;  Service: Gynecology;  Laterality: N/A;   DILATION AND EVACUATION N/A 07/08/2019   Procedure: DILATATION AND EVACUATION;  Surgeon: Hildred Laser, MD;  Location: [facility] ORS;  Service: Gynecology;  Laterality: N/A;    OB History  Gravida Para Term Preterm AB Living  5 2 2  0 2 2  SAB IAB Ectopic Multiple Live Births  2 0 0 0 2    # Outcome Date GA  Lbr Len/2nd Weight Sex Delivery Anes PTL Lv  5 Current           4 SAB 07/2019     SAB     3 Term 07/22/17 [redacted]w[redacted]d  4400 g F CS-LTranv Spinal  LIV  2 SAB 08/07/15 [redacted]w[redacted]d       ND  1 Term 12/01/08 [redacted]w[redacted]d  3941 g F CS-LTranv EPI  LIV    Obstetric Comments  2016 (D&C)    Social History   Socioeconomic History   Marital status: Single    Spouse name: Not on file   Number of children: Not on file   Years of education: Not on file   Highest education level: Not on file  Occupational History   Not on file  Tobacco Use   Smoking status: Never   Smokeless tobacco: Never  Vaping Use   Vaping Use: Never used  Substance and Sexual Activity   Alcohol use: Not Currently   Drug use: No   Sexual activity: Yes    Birth control/protection: Injection  Other Topics Concern   Not on file  Social History Narrative   Not on file   Social Determinants of Health   Financial Resource Strain: Not on file  Food Insecurity: Not on file  Transportation Needs: Not on file  Physical Activity: Not on file  Stress: Not on file  Social Connections: Not on file    Family History  Problem Relation Age of Onset  Healthy Mother    Healthy Father    Alcohol abuse Neg Hx    Arthritis Neg Hx    Asthma Neg Hx    Birth defects Neg Hx    Cancer Neg Hx    COPD Neg Hx    Depression Neg Hx    Diabetes Neg Hx    Drug abuse Neg Hx    Early death Neg Hx    Hearing loss Neg Hx    Heart disease Neg Hx    Hyperlipidemia Neg Hx    Kidney disease Neg Hx    Learning disabilities Neg Hx    Mental illness Neg Hx    Mental retardation Neg Hx    Miscarriages / Stillbirths Neg Hx    Stroke Neg Hx    Vision loss Neg Hx    Varicose Veins Neg Hx     Medications Prior to Admission  Medication Sig Dispense Refill Last Dose   cefUROXime (CEFTIN) 250 MG tablet Take 1 tablet (250 mg total) by mouth 2 (two) times daily with a meal. 14 tablet 0 Past Week   Prenat-FeAsp-Meth-FA-DHA w/o A (PRENATE PIXIE)  10-0.6-0.4-200 MG CAPS Take 1 each by mouth daily. 30 capsule 3 Past Week    Allergies  Allergen Reactions   Tramadol Itching   Chlorhexidine Itching    Review of Systems: Negative except for what is mentioned in HPI.  Physical Exam: BP 123/76 (BP Location: Left Arm)   Pulse 81   Temp 98.5 F (36.9 C) (Oral)   Resp 18   LMP 06/16/2021 (Exact Date)  FHR by Doppler: 140 bpm GENERAL: Well-developed, well-nourished female in no acute distress.  LUNGS: Clear to auscultation bilaterally.  HEART: Regular rate and rhythm. ABDOMEN: Soft, nontender, nondistended, gravid, well-healed Pfannenstiel incision. PELVIC: Deferred EXTREMITIES: Nontender, no edema, 2+ distal pulses.   Pertinent Labs/Studies:   Results for orders placed or performed during the hospital encounter of 03/17/22 (from the past 72 hour(s))  Type and screen Hydro     Status: None (Preliminary result)   Collection Time: 03/17/22  6:18 AM  Result Value Ref Range   ABO/RH(D) PENDING    Antibody Screen PENDING    Sample Expiration      03/20/2022,2359 Performed at [facility], [address] [place], [place] [address]   CBC     Status: Abnormal   Collection Time: 03/17/22  6:18 AM  Result Value Ref Range   WBC 8.8 4.0 - 10.5 K/uL   RBC 3.86 (L) 3.87 - 5.11 MIL/uL   Hemoglobin 10.7 (L) 12.0 - 15.0 g/dL   HCT 33.5 (L) 36.0 - 46.0 %   MCV 86.8 80.0 - 100.0 fL   MCH 27.7 26.0 - 34.0 pg   MCHC 31.9 30.0 - 36.0 g/dL   RDW 14.6 11.5 - 15.5 %   Platelets 218 150 - 400 K/uL   nRBC 0.0 0.0 - 0.2 %    Comment: Performed at [facility], [address], [place], [place] [address]    Assessment and Plan :Pamela [name] is a 36 y.o. [id number] at [redacted]w[redacted]d being admitted  for scheduled repeat cesarean section delivery . The patient is understanding of the planned procedure and is aware of and accepting of all surgical risks, including but not limited to: bleeding which may require  transfusion or reoperation; infection which may require antibiotics; injury to bowel, bladder, ureters or other surrounding organs which may require repair; injury to the fetus; need  for additional procedures including hysterectomy in the event of life-threatening complications; placental abnormalities wth subsequent pregnancies; incisional problems; blood clot disorders which may require blood thinners;, and other postoperative/anesthesia complications. The patient is in agreement with the proposed plan, and gives informed written consent for the procedure. All questions have been answered.   Hildred Laser, MD [facility]

## 2022-03-17 NOTE — Op Note (Signed)
Cesarean Section Procedure Note  Indications: patient declines vag del attempt and previous uterine incision C-section x 2 (low transverse)  Pre-operative Diagnosis: 39 week 1 day pregnancy.  Post-operative Diagnosis: same  Surgeon: Hildred Laser, MD  Assistants:  Brennan Bailey, MD  Procedure: Repeat low transverse Cesarean Section  Anesthesia: Spinal anesthesia  Findings: Female infant, cephalic presentation, 3800 grams, with Apgar scores of 9 at one minute and 9 at five minutes. Intact placenta with 3 vessel cord.  Clear amniotic fluid at amniotomy Thin lower uterine segment.  The uterine outline, tubes and ovaries appeared normal.   Procedure Details: The patient was seen in the Holding Room. The risks, benefits, complications, treatment options, and expected outcomes were discussed with the patient.  The patient concurred with the proposed plan, giving informed consent.  The site of surgery properly noted/marked. The patient was taken to the Operating Room, identified as Pamela [name] and the procedure verified as C-Section Delivery. A Time Out was held and the above information confirmed.  After induction of anesthesia, the patient was draped and prepped in the usual sterile manner. Anesthesia was tested and noted to be adequate. A Pfannenstiel incision was made and carried down through the subcutaneous tissue to the fascia. Fascial incision was made and extended transversely. The fascia was separated from the underlying rectus tissue superiorly and inferiorly. The peritoneum was identified and entered. Peritoneal incision was extended longitudinally. The surgical assist was able to provide retraction to allow for clear visualization of surgical site. An Alexis retractor was placed in the abdomen for additional retraction. The utero-vesical peritoneal reflection was incised transversely and the bladder flap was bluntly freed from the lower uterine segment. A low transverse uterine  incision was made. Delivered from cephalic presentation was a 3800 gram Female with Apgar scores of 9 at one minute and 9 at five minutes.  The assistant was able to apply adequate fundal pressure to allow for successful delivery of the fetus. After the umbilical cord was clamped and cut, no cord blood was obtained for evaluation (patient was Rh positive). Delayed cord clamping was observed. The placenta was removed intact and appeared normal. The uterus was exteriorized and cleared of all clots and debris. The uterine outline, tubes and ovaries appeared normal.  The uterine incision was closed with running locked sutures of 0-Vicryl.  A second suture of 0-Vicryl was used in an imbricating layer.  Hemostasis was observed. The uterus was then returned to the abdomen. The pericolic gutters were cleared of all clots and debris. The peritoneum was reapproximated using 3-0 Vicryl in a running fashion. Seven ml of ZynRelef was placed over the peritoneal layer.  The fascia was then reapproximated with a running suture of 0-Vicryl. Another 6 ml of ZynRelef was placed over the fascial incision.  The skin was reapproximated with 4-0 Monocryl.  The incision was covered with Dermabond.  Instrument, sponge, and needle counts were correct prior the abdominal closure and at the conclusion of the case.    An experienced assistant was required given the standard of surgical care given the complexity of the case.  This assistant was needed for exposure, dissection, suctioning, retraction, instrument exchange, application of fundal pressure, and for overall help during the procedure.  Estimated Blood Loss:  680 ml      Drains: foley catheter to gravity drainage, 50 ml of clear urine at end of the procedure         Total IV Fluids:  700 ml  Specimens: None  Implants: None         Complications:  None; patient tolerated the procedure well.         Disposition: PACU - hemodynamically stable.         Condition:  stable   Hildred Laser, MD [facility]

## 2022-03-18 LAB — CBC
HCT: 31.9 % — ABNORMAL LOW (ref 36.0–46.0)
Hemoglobin: 10.4 g/dL — ABNORMAL LOW (ref 12.0–15.0)
MCH: 28 pg (ref 26.0–34.0)
MCHC: 32.6 g/dL (ref 30.0–36.0)
MCV: 85.8 fL (ref 80.0–100.0)
Platelets: 230 10*3/uL (ref 150–400)
RBC: 3.72 MIL/uL — ABNORMAL LOW (ref 3.87–5.11)
RDW: 14.3 % (ref 11.5–15.5)
WBC: 21.9 10*3/uL — ABNORMAL HIGH (ref 4.0–10.5)
nRBC: 0 % (ref 0.0–0.2)

## 2022-03-18 LAB — RPR: RPR Ser Ql: NONREACTIVE

## 2022-03-18 NOTE — Progress Notes (Signed)
Progress Note  Called to the bedside to evaluate Pamela [name]'s incision. There is a small area of skin that is not approximated. A pressure dressing had been applied earlier. Around 0930, the RN applied steristrips and a honeycomb dressing. The steristrips and the bottom half of the honeycomb dressing are now saturated. The incision is slowly oozing from the non-approximated area. Steristrips applied. Dr. Valentino Saxon notified and will evaluate it.  Guadlupe Spanish, CNM

## 2022-03-18 NOTE — Progress Notes (Signed)
Postpartum Day # 1: Cesarean Delivery  Subjective: Patient reports ambulating without difficulty, tolerating PO and no problems voiding.  Patient notes that she is not noticing much milk production as of yet.   Objective: Vital signs in last 24 hours: Temp:  [97.6 F (36.4 C)-98.3 F (36.8 C)] 97.9 F (36.6 C) (07/17 2225) Pulse Rate:  [57-110] 84 (07/18 0740) Resp:  [14-20] 20 (07/17 2225) BP: (112-139)/(59-99) 126/79 (07/17 2225) SpO2:  [89 %-100 %] 96 % (07/18 0740) Weight:  [93.5 kg] 93.5 kg (07/17 0915)  Physical Exam:  General: alert and no distress Lungs: clear to auscultation bilaterally Breasts: normal appearance, no masses or tenderness Heart: regular rate and rhythm, S1, S2 normal, no murmur, click, rub or gallop Abdomen: soft, non-tender; bowel sounds normal; no masses,  no organomegaly Pelvis: Lochia appropriate, Uterine Fundus firm, Incision: dressing clean/dry/intact. Extremities: DVT Evaluation: No evidence of DVT seen on physical exam. Negative Homan's sign. No cords or calf tenderness. No significant calf/ankle edema.  Recent Labs    03/17/22 0618 03/18/22 0553  HGB 10.7* 10.4*  HCT 33.5* 31.9*    Assessment/Plan: Status post Cesarean section. Doing well postoperatively.  Breastfeeding, Lactation consult Circumcision prior to discharge Contraception:  Depo Provera. Regular diet as tolerated Continue PO pain management Discontinue IVF Continue current care.  Plan for discharge tomorrow.   Hildred Laser, MD [facility]

## 2022-03-18 NOTE — Anesthesia Postprocedure Evaluation (Signed)
Anesthesia Post Note  Patient: Pamela [name]  Procedure(s) Performed: CESAREAN SECTION  Patient location during evaluation: Mother Baby Anesthesia Type: Spinal Level of consciousness: awake and alert and oriented Pain management: pain level controlled Vital Signs Assessment: post-procedure vital signs reviewed and stable Respiratory status: respiratory function stable Cardiovascular status: stable Postop Assessment: no headache, no backache, patient able to bend at knees, no apparent nausea or vomiting, able to ambulate and adequate PO intake Anesthetic complications: no   No notable events documented.   Last Vitals:  Vitals:   03/18/22 0735 03/18/22 0740  BP:    Pulse: 83 84  Resp:    Temp:    SpO2: 97% 96%    Last Pain:  Vitals:   03/17/22 2225  TempSrc: Axillary  PainSc:                  Zachary George

## 2022-03-18 NOTE — Lactation Note (Signed)
This note was copied from a baby's chart. Lactation Consultation Note  Patient Name: Pamela [name] [id number] Date: 03/18/2022 Reason for consult: Initial assessment;Term Age:35 hours  Maternal Data Has patient been taught Hand Expression?: Yes Does the patient have breastfeeding experience prior to this delivery?: Yes How long did the patient breastfeed?: 2 months  Mom has some breastfeeding experience, but desires to breastfeed longer with this baby.  Feeding Mother's Current Feeding Choice: Breast Milk and Formula  Baby has been exclusively BF thus far, output exceeds expectations, weight loss 3% is within acceptable ranges.  LATCH Score       Type of Nipple: Everted at rest and after stimulation  Comfort (Breast/Nipple): Soft / non-tender  Hold (Positioning): No assistance needed to correctly position infant at breast.    Baby had just completed feeding upon entry; pacifying at the breast. Mom removed baby from breast and nipple was nice and round indicating a deep latch.  Lactation Tools Discussed/Used Tools: Pump Breast pump type: Double-Electric Breast Pump Pump Education:  (hasn't used it) Reason for Pumping: per mom's request  Interventions Interventions: Breast feeding basics reviewed;Hand express;Education (feeding patterns/behaviors, output expectations, signs of transfer/contentment between feedings, signs of good/deep latch, identifying swallows, delay in formula supplementation)  LC reviewed with parents early hunger cues, feeding patterns/behaviors, milk supply and demand, normal course of lactation, encouraged feeding on demand, spending time skin to skin. Mom had questions about starting the pump: we reviewed need - mom wants to build supply. Encouraged her to put baby to breast on demand, and again reviewed supply/demand, but pump option to be used post feeds if wanted to help with supply.  Discharge    Consult Status Consult Status:  Follow-up Date: 03/18/22 Follow-up type: In-patient  Plans to work with dyad today for feeding observation and building confidence in breastfeeding.  Pamela [name] 03/18/2022, 9:37 AM

## 2022-03-18 NOTE — Discharge Instructions (Signed)

## 2022-03-19 ENCOUNTER — Encounter: Payer: Self-pay | Admitting: Obstetrics and Gynecology

## 2022-03-19 MED ORDER — OXYCODONE-ACETAMINOPHEN 5-325 MG PO TABS
1.0000 | ORAL_TABLET | ORAL | Status: DC | PRN
  Administered 2022-03-19 – 2022-03-20 (×2): 2 via ORAL
  Filled 2022-03-19 (×2): qty 2

## 2022-03-19 MED ORDER — IBUPROFEN 600 MG PO TABS
600.0000 mg | ORAL_TABLET | Freq: Four times a day (QID) | ORAL | Status: DC
  Administered 2022-03-19 – 2022-03-20 (×3): 600 mg via ORAL
  Filled 2022-03-19 (×3): qty 1

## 2022-03-19 NOTE — Lactation Note (Signed)
This note was copied from a baby's chart. Lactation Consultation Note  Patient Name: Pamela [name] [id number] Date: 03/19/2022 Reason for consult: Follow-up assessment;Term;Other (Comment) (c-section) Age:35 hours  Maternal Data Has patient been taught Hand Expression?: Yes Does the patient have breastfeeding experience prior to this delivery?: Yes   Feeding Mother's Current Feeding Choice: Breast Milk and Formula  Baby continues to be exclusively breastfed. Mom actively trying to feed baby during this visit.  LATCH Score Latch: Grasps breast easily, tongue down, lips flanged, rhythmical sucking. (slight help flanging and increasing wide open mouth)  Audible Swallowing: Spontaneous and intermittent  Type of Nipple: Everted at rest and after stimulation  Comfort (Breast/Nipple): Soft / non-tender  Hold (Positioning): Assistance needed to correctly position infant at breast and maintain latch.  LATCH Score: 27  Mom has baby in cradle hold at L breast. Baby appears to have shallow latch, and mom is concerned about output "it's difficult to extract colostrum". LC hand expressed easily and colostrum expressed. LC assisted with showing mom how to obtain a wide open mouth and flanged upper and lower lips. Mom had slight discomfort at initial latch but subsided quickly- possibly transient nipple tenderness.  Once latched baby had rhythmic suck pattern with spontaneous swallows.  Lactation Tools Discussed/Used Tools: Pump Breast pump type: Double-Electric Breast Pump Reason for Pumping: mom's request Pumped volume: 10 mL  Interventions Interventions: Assisted with latch;Hand express;Breast compression;Adjust position;Support pillows;Education (questions answered: milk transfer, position/latch, pump questions)  Discharge Discharge Education: Engorgement and breast care;Warning signs for feeding baby;Outpatient recommendation Pump: Advised to call insurance company (Eligible for  [facility], has medicaid)  Anticipatory guidance given for breast changes, management of breast fullness/engorgement and nipple care.  Outpatient Lactation service information given. Encouraged mom to call as needed for BF support.  Consult Status Consult Status: Complete  -Mom undecided about being discharged today or tomorrow.  [name] 03/19/2022, 10:04 AM

## 2022-03-19 NOTE — Progress Notes (Signed)
Postpartum Day # 2: Cesarean Delivery  Subjective: Patient reports ambulating without difficulty, tolerating PO and no problems voiding.  Working on breastfeeding.  Requests change to Percocet instead of Oxycodone as she feels that it makes her more tired but does not help as well with the pain.   Objective: Vitals:   03/18/22 0740 03/18/22 0750 03/18/22 1526 03/18/22 2315  BP:  128/63 108/67 116/61  Pulse: 84  71 74  Resp:  19 20 20   Temp:  98.1 F (36.7 C) 98.2 F (36.8 C) 97.8 F (36.6 C)  TempSrc:  Oral  Oral  SpO2: 96%  98% 96%  Weight:      Height:        Physical Exam:  General: alert and no distress Lungs: clear to auscultation bilaterally Breasts: normal appearance, no masses or tenderness Heart: regular rate and rhythm, S1, S2 normal, no murmur, click, rub or gallop Abdomen: soft, non-tender; bowel sounds normal; no masses,  no organomegaly Pelvis: Lochia appropriate, Uterine Fundus firm, Incision: Pressure dressing removed, moderately saturated.  Scant serosanguinous fluid noted at midline of incision with 5 mm opening upon palpation of incision site.  Extremities: DVT Evaluation: No evidence of DVT seen on physical exam. Negative Homan's sign. No cords or calf tenderness. No significant calf/ankle edema.  Recent Labs    03/17/22 0618 03/18/22 0553  HGB 10.7* 10.4*  HCT 33.5* 31.9*     Assessment/Plan: Status post Cesarean section. Doing well postoperatively.  Breastfeeding Circumcision after discharge Contraception:  Depo Provera. Regular diet as tolerated Continue PO pain management Will place honeycomb bandage to continue to monitor drainage.  Continue current care.  Plan for discharge tomorrow.   03/20/22, MD [facility]

## 2022-03-20 MED ORDER — DOCUSATE SODIUM 100 MG PO CAPS: 100.0000 mg | ORAL_CAPSULE | Freq: Two times a day (BID) | ORAL | 2 refills | Status: DC | PRN

## 2022-03-20 MED ORDER — IBUPROFEN 600 MG PO TABS: 600.0000 mg | ORAL_TABLET | Freq: Four times a day (QID) | ORAL | 1 refills | Status: DC | PRN

## 2022-03-20 MED ORDER — OXYCODONE-ACETAMINOPHEN 5-325 MG PO TABS
1.0000 | ORAL_TABLET | Freq: Four times a day (QID) | ORAL | 0 refills | Status: DC | PRN
Start: 2022-03-20 — End: 2022-03-28

## 2022-03-20 MED ORDER — IBUPROFEN 600 MG PO TABS
600.0000 mg | ORAL_TABLET | Freq: Four times a day (QID) | ORAL | Status: DC
  Administered 2022-03-20: 600 mg via ORAL
  Filled 2022-03-20: qty 1

## 2022-03-20 NOTE — Progress Notes (Signed)
Postpartum Day # 3: Cesarean Delivery  Subjective: Patient reports that she feels like she may be constipated.  Has not had a bowel movement but has been passing gas. Ambulating without difficulty, tolerating PO and no problems voiding.  Breastfeeding is going well.  Bleeding is light.   Objective: Vitals:   03/18/22 0750 03/18/22 1526 03/18/22 2315 03/19/22 2300  BP: 128/63 108/67 116/61 116/82  Pulse:  71 74 73  Resp: 19 20 20 18   Temp: 98.1 F (36.7 C) 98.2 F (36.8 C) 97.8 F (36.6 C) 98.6 F (37 C)  TempSrc: Oral  Oral Oral  SpO2:  98% 96% 100%  Weight:      Height:        Physical Exam:  General: alert and no distress Lungs: clear to auscultation bilaterally Breasts: normal appearance, no masses or tenderness Heart: regular rate and rhythm, S1, S2 normal, no murmur, click, rub or gallop Abdomen: soft, non-tender; bowel sounds normal; no masses,  no organomegaly Pelvis: Lochia appropriate, Uterine Fundus firm, Incision: Pressure dressing in place.  Extremities: DVT Evaluation: No evidence of DVT seen on physical exam. Negative Homan's sign. No cords or calf tenderness. No significant calf/ankle edema.  Recent Labs    03/18/22 0553  HGB 10.4*  HCT 31.9*     Assessment/Plan: Status post Cesarean section. Doing well postoperatively.  Breastfeeding Circumcision after discharge Contraception:  Depo Provera. Regular diet as tolerated Continue PO pain management Will remove pressure dressing and apply Dermabond.   Plan for discharge today.   03/20/22, MD [facility]

## 2022-03-20 NOTE — Discharge Summary (Signed)
Postpartum Discharge Summary      Patient Name: Pamela [name] DOB: [birth year] MRN: [id number]  Date of admission: 03/17/2022 Delivery date:03/17/2022  Delivering provider: Rubie Maid  Date of discharge: 03/20/2022  Admitting diagnosis: History of 2 cesarean sections [Z98.891] Intrauterine pregnancy: [redacted]w[redacted]d    Secondary diagnosis:  Principal Problem:   History of 2 cesarean sections  Additional problems: None    Discharge diagnosis: Term Pregnancy Delivered                                              Post partum procedures: Nonw Augmentation: N/A Complications: None  Hospital course: Sceduled C/S   36y.o. yo [id number] admitted to the hospital 03/17/2022 for scheduled cesarean section with the following indication:Elective Repeat.Delivery details are as follows:  Membrane Rupture Time/Date: 8:23 AM ,03/17/2022   Delivery Method:C-Section, Low Transverse  Details of operation can be found in separate operative note.  Patient had an uncomplicated postpartum course.  She is ambulating, tolerating a regular diet, passing flatus, and urinating well. Patient is discharged home in stable condition on  03/22/22        Newborn Data: Birth date:03/17/2022  Birth time:8:24 AM  Gender:Female  Living status:Living  Apgars:9 ,9  Weight:8 lb 6 oz (3.8 kg)     Magnesium Sulfate received: No BMZ received: No Rhophylac:No MMR:No T-DaP:Given prenatally Flu: No Transfusion:No  Physical exam  Vitals:   03/18/22 0750 03/18/22 1526 03/18/22 2315 03/19/22 2300  BP: 128/63 108/67 116/61 116/82  Pulse:  71 74 73  Resp: _0 Temp: 98.1 F (36.7 C) 98.2 F (36.8 C) 97.8 F (36.6 C) 98.6 F (37 C)  TempSrc: Oral  Oral Oral  SpO2:  98% 96% 100%  Weight:      Height:       General: alert, cooperative, and no distress Lochia: appropriate Uterine Fundus: firm Incision: N/A DVT Evaluation: No evidence of DVT seen on physical exam. Negative Homan's sign. No cords  or calf tenderness. No significant calf/ankle edema.Labs: Lab Results  Component Value Date   WBC 21.9 (H) 03/18/2022   HGB 10.4 (L) 03/18/2022   HCT 31.9 (L) 03/18/2022   MCV 85.8 03/18/2022   PLT 230 03/18/2022    Edinburgh Score:    03/18/2022    8:00 AM  Edinburgh Postnatal Depression Scale Screening Tool  I have been able to laugh and see the funny side of things. 0  I have looked forward with enjoyment to things. 0  I have blamed myself unnecessarily when things went wrong. 0  I have been anxious or worried for no good reason. 1  I have felt scared or panicky for no good reason. 0  Things have been getting on top of me. 1  I have been so unhappy that I have had difficulty sleeping. 0  I have felt sad or miserable. 0  I have been so unhappy that I have been crying. 0  The thought of harming myself has occurred to me. 0  Edinburgh Postnatal Depression Scale Total 2      After visit meds:  Allergies as of 03/20/2022       Reactions   Tramadol Itching   Chlorhexidine Itching        Medication List     STOP taking these medications  cefUROXime 250 MG tablet Commonly known as: CEFTIN       TAKE these medications    docusate sodium 100 MG capsule Commonly known as: COLACE Take 1 capsule (100 mg total) by mouth 2 (two) times daily as needed.   ibuprofen 600 MG tablet Commonly known as: ADVIL Take 1 tablet (600 mg total) by mouth every 6 (six) hours as needed.   oxyCODONE-acetaminophen 5-325 MG tablet Commonly known as: PERCOCET/ROXICET Take 1-2 tablets by mouth every 6 (six) hours as needed for moderate pain or severe pain.   Prenate Pixie 10-0.6-0.4-200 MG Caps Take 1 each by mouth daily.         Discharge home in stable condition Infant Feeding: Breast Infant Disposition:home with mother Discharge instruction: per After Visit Summary and Postpartum booklet. Activity: Advance as tolerated. Pelvic rest for 6 weeks.  Diet: routine  diet Anticipated Birth Control: Depo Postpartum Appointment:6 weeks Additional Postpartum F/U: Postpartum Depression checkup Future Appointments: Future Appointments  Date Time Provider [place]  04/01/2022  9:45 AM Rubie Maid, MD [facility] None   Follow up Visit:  Follow-up Information     Rubie Maid, MD. Schedule an appointment as soon as possible for a visit in 1 week(s).   Specialties: Obstetrics and Gynecology, Radiology Why: incision check Contact information: [address] [place] [address] [phone]                     03/20/2022 Rubie Maid, MD

## 2022-03-24 ENCOUNTER — Ambulatory Visit: Payer: Medicaid Other | Admitting: Obstetrics and Gynecology

## 2022-03-24 ENCOUNTER — Telehealth: Payer: Self-pay | Admitting: Obstetrics and Gynecology

## 2022-03-24 ENCOUNTER — Encounter: Payer: Self-pay | Admitting: Obstetrics and Gynecology

## 2022-03-24 VITALS — BP 119/74 | HR 71 | Temp 99.5°F | Ht 68.0 in | Wt 190.3 lb

## 2022-03-24 DIAGNOSIS — Z98891 History of uterine scar from previous surgery: Secondary | ICD-10-CM

## 2022-03-24 DIAGNOSIS — M7989 Other specified soft tissue disorders: Secondary | ICD-10-CM

## 2022-03-24 DIAGNOSIS — Z4889 Encounter for other specified surgical aftercare: Secondary | ICD-10-CM

## 2022-03-24 NOTE — Telephone Encounter (Signed)
If she wanted me to take a look at it today, I could see her this afternoon at 2 or 2:30

## 2022-03-24 NOTE — Telephone Encounter (Signed)
Returned call from My Chart message. Pt states "Left hospital with open C-section it was still bleeding days after after I got home can she take a look at it today and change out my bandages". Pt is concerned, she had to change the incision bandage earlier than the date given due to excessive saturation. Pt states "no fever, no pus, and the  bleeding has stopped. However, pt is still concerned about appearance. Consulted CMA advised pt to seek help at ED if she was uncertain about the way the incision looked. Pt states " she feels she can wait for an office visit ". Encouraged pt if she noticed any changes to air on the side of caution and be seen at ED. Please return call

## 2022-03-24 NOTE — Telephone Encounter (Signed)
Patient spoke with front office staff. Advised to go to urgent care or ED if concerns due to no providers in office today. She informed the front office that she will wait until office visit with Cherry at this time.

## 2022-03-24 NOTE — Progress Notes (Signed)
    OBSTETRICS/GYNECOLOGY POST-OPERATIVE CLINIC VISIT  Subjective:     Pamela [name] is a 36 y.o. [phone] female who presents to the clinic 1 weeks status post  repeat low-transverse C-section  for  history of prior C-section x 2 . Eating a regular diet without difficulty. Bowel movements are normal. Pain is controlled with current analgesics. Medications being used: prescription NSAID's including ibuprofen (Motrin) and narcotic analgesics including oxycodone/acetaminophen (Percocet, Tylox).  Patient reports that since leaving the hospital, her incision site was still bleeding.  Has had to change a dressing daily.  Concern in hospital was possible hematoma formation that was draining.   [name] also complains about a lump under her right arm, seems to get bigger when she breastfeeds.    The following portions of the patient's history were reviewed and updated as appropriate: allergies, current medications, past family history, past medical history, past social history, past surgical history, and problem list.  Review of Systems Pertinent items noted in HPI and remainder of comprehensive ROS otherwise negative.   Objective:   BP 119/74   Pulse 71   Temp 99.5 F (37.5 C)   Wt 190 lb 4.8 oz (86.3 kg)   BMI 28.94 kg/m  Body mass index is 28.94 kg/m.  General:  alert and no distress  Axilla: Right axillary region with soft fleshy enlargement, non-tender, fluctuant. Breast and nipple without erythema, edema, tenderness.    Abdomen: soft, bowel sounds active, non-tender  Incision:   Bandage with small 2 x 2 area of blood in center of the dressing.  Otherwise healing well, no drainage, no erythema, no hernia, no seroma, no swelling, incision well approximated. Small area of skin desquamation along left edge of the incision (~ 3 cm in length).     Pathology:   None  Assessment:   Patient s/p repeat C-section Right axillary swelling   Plan:   1. Continue any current medications  as instructed by provider. 2. Wound care discussed. Area cleaned, new Dermabond dressing placed over incision.  3. Activity restrictions: no bending, stooping, or squatting, no lifting more than 10-15 pounds, and pelvic rest 4. Right axilla with some swelling (possibly lymph node vs clogged milk duct). Advised on warm compresses to the area prior to breastfeeding and manual massage.  To return if fevers, chills, breast pain, red streaking occurs.   5. Anticipated return to work:  5 weeks . 6. Follow up: 1 week for incision check.     Hildred Laser, MD [facility]

## 2022-03-25 ENCOUNTER — Encounter: Payer: Self-pay | Admitting: Emergency Medicine

## 2022-03-25 ENCOUNTER — Other Ambulatory Visit: Payer: Self-pay

## 2022-03-25 ENCOUNTER — Emergency Department
Admission: EM | Admit: 2022-03-25 | Discharge: 2022-03-25 | Discharge status: Against Medical Advice | Payer: Medicaid Other | Attending: Emergency Medicine | Admitting: Emergency Medicine

## 2022-03-25 ENCOUNTER — Telehealth: Payer: Self-pay | Admitting: Obstetrics and Gynecology

## 2022-03-25 ENCOUNTER — Emergency Department
Admission: EM | Admit: 2022-03-25 | Discharge: 2022-03-25 | Discharge status: Against Medical Advice | Payer: Medicaid Other | Source: Home / Self Care

## 2022-03-25 DIAGNOSIS — T889XXA Complication of surgical and medical care, unspecified, initial encounter: Secondary | ICD-10-CM | POA: Insufficient documentation

## 2022-03-25 DIAGNOSIS — Y69 Unspecified misadventure during surgical and medical care: Secondary | ICD-10-CM | POA: Insufficient documentation

## 2022-03-25 DIAGNOSIS — Z5321 Procedure and treatment not carried out due to patient leaving prior to being seen by health care provider: Secondary | ICD-10-CM | POA: Insufficient documentation

## 2022-03-25 DIAGNOSIS — T8149XA Infection following a procedure, other surgical site, initial encounter: Secondary | ICD-10-CM | POA: Insufficient documentation

## 2022-03-25 DIAGNOSIS — O8609 Infection of obstetric surgical wound, other surgical site: Secondary | ICD-10-CM | POA: Insufficient documentation

## 2022-03-25 LAB — CBC WITH DIFFERENTIAL/PLATELET
Abs Immature Granulocytes: 0.07 10*3/uL (ref 0.00–0.07)
Basophils Absolute: 0 10*3/uL (ref 0.0–0.1)
Basophils Relative: 0 %
Eosinophils Absolute: 0.1 10*3/uL (ref 0.0–0.5)
Eosinophils Relative: 1 %
HCT: 39.2 % (ref 36.0–46.0)
Hemoglobin: 12.3 g/dL (ref 12.0–15.0)
Immature Granulocytes: 1 %
Lymphocytes Relative: 25 %
Lymphs Abs: 2.4 10*3/uL (ref 0.7–4.0)
MCH: 27.3 pg (ref 26.0–34.0)
MCHC: 31.4 g/dL (ref 30.0–36.0)
MCV: 86.9 fL (ref 80.0–100.0)
Monocytes Absolute: 0.6 10*3/uL (ref 0.1–1.0)
Monocytes Relative: 6 %
Neutro Abs: 6.4 10*3/uL (ref 1.7–7.7)
Neutrophils Relative %: 67 %
Platelets: 337 10*3/uL (ref 150–400)
RBC: 4.51 MIL/uL (ref 3.87–5.11)
RDW: 15.6 % — ABNORMAL HIGH (ref 11.5–15.5)
WBC: 9.5 10*3/uL (ref 4.0–10.5)
nRBC: 0 % (ref 0.0–0.2)

## 2022-03-25 LAB — COMPREHENSIVE METABOLIC PANEL
ALT: 40 U/L (ref 0–44)
AST: 26 U/L (ref 15–41)
Albumin: 3.2 g/dL — ABNORMAL LOW (ref 3.5–5.0)
Alkaline Phosphatase: 124 U/L (ref 38–126)
Anion gap: 6 (ref 5–15)
BUN: 10 mg/dL (ref 6–20)
CO2: 25 mmol/L (ref 22–32)
Calcium: 9.1 mg/dL (ref 8.9–10.3)
Chloride: 106 mmol/L (ref 98–111)
Creatinine, Ser: 0.79 mg/dL (ref 0.44–1.00)
GFR, Estimated: >60 mL/min (ref >60)
Glucose, Bld: 98 mg/dL (ref 70–99)
Potassium: 4.1 mmol/L (ref 3.5–5.1)
Sodium: 137 mmol/L (ref 135–145)
Total Bilirubin: 0.7 mg/dL (ref 0.3–1.2)
Total Protein: 6.9 g/dL (ref 6.5–8.1)

## 2022-03-25 MED ORDER — SULFAMETHOXAZOLE-TRIMETHOPRIM 800-160 MG PO TABS: 1.0000 | ORAL_TABLET | Freq: Two times a day (BID) | ORAL | 1 refills | Status: DC

## 2022-03-25 NOTE — Telephone Encounter (Signed)
Patient states she went to the ER last night because incision site where the glue was placed looks like there is now blood with puss in it. Pt states she did not be seen at the ER because it was a 10 hour wait. Pt states she does not have a fever or anything but thinks the site may be becoming infected. Please advise.

## 2022-03-25 NOTE — Telephone Encounter (Signed)
Made in error

## 2022-03-25 NOTE — ED Notes (Signed)
Patient up to desk and says she is going to leave.  Dr Loanne Drilling is calling in rx for her.  She asked about whether she can breastfeed.  I told her to ask dr Valentino Saxon if she could still breastfeed  on the med.  I also told her to return if she gets worse at all.

## 2022-03-25 NOTE — Telephone Encounter (Signed)
Pt states she believes the incision site may be infected, pt is experiencing pain. No provider or CMA in office due to lunch . Advised pt to seek care at ED.

## 2022-03-25 NOTE — ED Triage Notes (Signed)
Patient to ED via POV for post-op infection. Patient had C section on Monday and is now having chills and puss coming out of the incision.

## 2022-03-25 NOTE — Telephone Encounter (Signed)
Rx sent to pt pharmacy on file, patient made aware via mychart message.

## 2022-03-25 NOTE — ED Provider Triage Note (Signed)
Emergency Medicine Provider Triage Evaluation Note  Pamela [name] , a 36 y.o. female  was evaluated in triage.  Pt complains of infection of c-section site. She has had drainage and has felt hot. .  Physical Exam  BP 133/82 (BP Location: Left Arm)   Pulse 76   Temp 99.1 F (37.3 C) (Oral)   Resp 18   Ht 5\' 8"  (1.727 m)   Wt 86.2 kg   SpO2 100%   BMI 28.89 kg/m  Gen:   Awake, no distress   Resp:  Normal effort  MSK:   Moves extremities without difficulty  Other:    Medical Decision Making  Medically screening exam initiated at 2:30 PM.  Appropriate orders placed.  Ilayda L Gerwig was informed that the remainder of the evaluation will be completed by another provider, this initial triage assessment does not replace that evaluation, and the importance of remaining in the ED until their evaluation is complete.   , FNP 03/25/22 1433

## 2022-03-25 NOTE — Progress Notes (Signed)
  Per Dr Valentino Saxon,  Please send in prescription for Bactrim dS BID x 7 days.  It did not appear infected yesterday and may be just the reaction to the glue however we will still go ahead and treat.

## 2022-03-25 NOTE — Telephone Encounter (Signed)
Please send in prescription for Bactrim dS BID x 7 days.  It did not appear infected yesterday and may be just the reaction to the glue however we will still go ahead and treat.

## 2022-03-25 NOTE — ED Triage Notes (Signed)
S/P C section Monday. Had incision site re-glued on yesterday.  Has noted small amount of bleeding from sites today. Scant amount of blood noted on dressing. Denies fever at home, denies any other drainage from area.

## 2022-03-28 ENCOUNTER — Other Ambulatory Visit: Payer: Self-pay | Admitting: Obstetrics and Gynecology

## 2022-03-28 MED ORDER — OXYCODONE-ACETAMINOPHEN 5-325 MG PO TABS: 1.0000 | ORAL_TABLET | Freq: Four times a day (QID) | ORAL | 0 refills | Status: DC | PRN

## 2022-03-31 NOTE — Progress Notes (Unsigned)
    OBSTETRICS/GYNECOLOGY POST-OPERATIVE CLINIC VISIT  Subjective:     Pamela [name] is a 36 y.o. female who presents to the clinic 2 weeks status post CESAREAN SECTION for Hx of CESAREAN SECTION. Eating a regular diet {with-without:5700} difficulty. Bowel movements are {normal/abnormal***:19619}. {pain control:13522::"The patient is not having any pain."}  {Common ambulatory SmartLinks:19316}  Review of Systems {ros; complete:30496}   Objective:   There were no vitals taken for this visit. There is no height or weight on file to calculate BMI.  General:  alert and no distress  Abdomen: soft, bowel sounds active, non-tender  Incision:   {incision:13716::"no dehiscence","incision well approximated","healing well","no drainage","no erythema","no hernia","no seroma","no swelling"}    Pathology:    Assessment:   Patient s/p CESAREAN SECTION (surgery)  {doing well:13525::"Doing well postoperatively."}   Plan:   1. Continue any current medications as instructed by provider. 2. Wound care discussed. 3. Operative findings again reviewed. Pathology report discussed. 4. Activity restrictions: {restrictions:13723} 5. Anticipated return to work: {work return:14002}. 6. Follow up: {9-83:38250} {time; units:18646} for ***    Hildred Laser, MD [facility]

## 2022-04-01 ENCOUNTER — Ambulatory Visit (INDEPENDENT_AMBULATORY_CARE_PROVIDER_SITE_OTHER): Payer: Medicaid Other | Admitting: Obstetrics and Gynecology

## 2022-04-01 ENCOUNTER — Encounter: Payer: Self-pay | Admitting: Obstetrics and Gynecology

## 2022-04-01 VITALS — BP 122/72 | HR 72 | Ht 68.0 in | Wt 178.1 lb

## 2022-04-01 DIAGNOSIS — Z4889 Encounter for other specified surgical aftercare: Secondary | ICD-10-CM

## 2022-04-01 DIAGNOSIS — O900 Disruption of cesarean delivery wound: Secondary | ICD-10-CM

## 2022-04-01 DIAGNOSIS — Z98891 History of uterine scar from previous surgery: Secondary | ICD-10-CM

## 2022-04-01 DIAGNOSIS — T8131XS Disruption of external operation (surgical) wound, not elsewhere classified, sequela: Secondary | ICD-10-CM

## 2022-04-09 NOTE — Telephone Encounter (Signed)
Form completed and waiting on provider signature.

## 2022-04-18 NOTE — Progress Notes (Unsigned)
   OBSTETRICS POSTPARTUM CLINIC PROGRESS NOTE  Subjective:     Pamela [name] is a 36 y.o. [id number] female who presents for a postpartum visit. She is 6 weeks postpartum following a low cervical transverse Cesarean section. I have fully reviewed the prenatal and intrapartum course. The delivery was at 39.1 gestational weeks.  Anesthesia: spinal. Postpartum course has been complicated by superficial wound dehiscence and hematoma. Baby's course has been going well. Baby is feeding by bottle - [employer] . Bleeding: patient has not not resumed menses, with No LMP recorded (lmp unknown). Bowel function is normal. Bladder function is normal. Patient is not sexually active. Contraception method desired is Depo-Provera injections. Postpartum depression screening: negative.  EDPS score is 6.    The following portions of the patient's history were reviewed and updated as appropriate: allergies, current medications, past family history, past medical history, past social history, past surgical history, and problem list.  Review of Systems Pertinent items are noted in HPI.   Objective:    BP 121/70   Pulse 63   Resp 16   Ht 5\' 8"  (1.727 m)   Wt 185 lb 6.4 oz (84.1 kg)   LMP  (LMP Unknown)   Breastfeeding No   BMI 28.19 kg/m   General:  alert and no distress   Breasts:  inspection negative, no nipple discharge or bleeding, no masses or nodularity palpable  Lungs: clear to auscultation bilaterally  Heart:  regular rate and rhythm, S1, S2 normal, no murmur, click, rub or gallop  Abdomen: soft, non-tender; bowel sounds normal; no masses,  no organomegaly.  Well healed Pfannenstiel incision, with thin keloid and scar retraction.    Pelvis:  Normal external genitalia. Internal exam deferred.          Labs:  Lab Results  Component Value Date   HGB 12.3 03/25/2022   Results for orders placed or performed in visit on 04/22/22  POCT urine pregnancy  Result Value Ref Range   Preg Test, Ur  Negative Negative     Assessment:   1. Postpartum care following cesarean delivery   2. Encounter for prescription for depo-Provera      Plan:   1. Contraception: Depo-Provera injections 2. Discussed use of Mederma for stretch marks and Vitamin E oil and massages to loosen scar.  3. Follow up in:  4-6  months for annual exam, or sooner as needed.    04/24/22, MD [facility]

## 2022-04-22 ENCOUNTER — Ambulatory Visit (INDEPENDENT_AMBULATORY_CARE_PROVIDER_SITE_OTHER): Payer: Medicaid Other | Admitting: Obstetrics and Gynecology

## 2022-04-22 ENCOUNTER — Encounter: Payer: Self-pay | Admitting: Obstetrics and Gynecology

## 2022-04-22 DIAGNOSIS — Z30013 Encounter for initial prescription of injectable contraceptive: Secondary | ICD-10-CM | POA: Diagnosis not present

## 2022-04-22 LAB — POCT URINE PREGNANCY: Preg Test, Ur: NEGATIVE

## 2022-04-22 MED ORDER — MEDROXYPROGESTERONE ACETATE 150 MG/ML IM SUSP: 150.0000 mg | INTRAMUSCULAR | 3 refills | Status: DC

## 2022-04-22 MED ORDER — MEDROXYPROGESTERONE ACETATE 150 MG/ML IM SUSP
150.0000 mg | Freq: Once | INTRAMUSCULAR | Status: AC
  Administered 2022-04-22: 150 mg via INTRAMUSCULAR

## 2022-04-22 NOTE — Progress Notes (Signed)
Date last pap: 10/27/2019. Last Depo-Provera: 04/22/2022. Side Effects if any: None. Serum HCG indicated? Negative. Depo-Provera 150 mg IM given by: Santiago Bumpers, CMA. Next appointment due: November 7 - November 21.

## 2022-04-22 NOTE — Addendum Note (Signed)
Addended by: Tommie Raymond on: 04/22/2022 09:59 AM   Modules accepted: Orders

## 2022-04-22 NOTE — Patient Instructions (Signed)
Medroxyprogesterone Injection (Contraception) ?What is this medication? ?MEDROXYPROGESTERONE (me DROX ee proe JES te rone) prevents ovulation and pregnancy. It belongs to a group of medications called contraceptives. This medication is a progestin hormone. ?This medicine may be used for other purposes; ask your health care provider or pharmacist if you have questions. ?COMMON BRAND NAME(S): Depo-Provera, Depo-subQ Provera 104 ?What should I tell my care team before I take this medication? ?They need to know if you have any of these conditions: ?Asthma ?Blood clots ?Breast cancer or family history of breast cancer ?Depression ?Diabetes ?Eating disorder (anorexia nervosa) ?Heart attack ?High blood pressure ?HIV infection or AIDS ?If you often drink alcohol ?Kidney disease ?Liver disease ?Migraine headaches ?Osteoporosis, weak bones ?Seizures ?Stroke ?Tobacco smoker ?Vaginal bleeding ?An unusual or allergic reaction to medroxyprogesterone, other hormones, medications, foods, dyes, or preservatives ?Pregnant or trying to get pregnant ?Breast-feeding ?How should I use this medication? ?Depo-Provera CI contraceptive injection is given into a muscle. Depo-subQ Provera 104 injection is given under the skin. It is given in a hospital or clinic setting. The injection is usually given during the first 5 days after the start of a menstrual period or 6 weeks after delivery of a baby. ?A patient package insert for the product will be given with each prescription and refill. Be sure to read this information carefully each time. The sheet may change often. ?Talk to your care team about the use of this medication in children. Special care may be needed. These injections have been used in female children who have started having menstrual periods. ?Overdosage: If you think you have taken too much of this medicine contact a poison control center or emergency room at once. ?NOTE: This medicine is only for you. Do not share this medicine  with others. ?What if I miss a dose? ?Keep appointments for follow-up doses. You must get an injection once every 3 months. It is important not to miss your dose. Call your care team if you are unable to keep an appointment. ?What may interact with this medication? ?Antibiotics or medications for infections, especially rifampin and griseofulvin ?Antivirals for HIV or hepatitis ?Aprepitant ?Armodafinil ?Bexarotene ?Bosentan ?Medications for seizures like carbamazepine, felbamate, oxcarbazepine, phenytoin, phenobarbital, primidone, topiramate ?Mitotane ?Modafinil ?St. John's wort ?This list may not describe all possible interactions. Give your health care provider a list of all the medicines, herbs, non-prescription drugs, or dietary supplements you use. Also tell them if you smoke, drink alcohol, or use illegal drugs. Some items may interact with your medicine. ?What should I watch for while using this medication? ?This medication does not protect you against HIV infection (AIDS) or other sexually transmitted diseases. ?Use of this product may cause you to lose calcium from your bones. Loss of calcium may cause weak bones (osteoporosis). Only use this product for more than 2 years if other forms of birth control are not right for you. The longer you use this product for birth control the more likely you will be at risk for weak bones. Ask your care team how you can keep strong bones. ?You may have a change in bleeding pattern or irregular periods. Many females stop having periods while taking this medication. ?If you have received your injections on time, your chance of being pregnant is very low. If you think you may be pregnant, see your care team as soon as possible. ?Tell your care team if you want to get pregnant within the next year. The effect of this medication may last a   long time after you get your last injection. ?What side effects may I notice from receiving this medication? ?Side effects that you should  report to your care team as soon as possible: ?Allergic reactions--skin rash, itching, hives, swelling of the face, lips, tongue, or throat ?Blood clot--pain, swelling, or warmth in the leg, shortness of breath, chest pain ?Gallbladder problems--severe stomach pain, nausea, vomiting, fever ?Increase in blood pressure ?Liver injury--right upper belly pain, loss of appetite, nausea, light-colored stool, dark yellow or brown urine, yellowing skin or eyes, unusual weakness or fatigue ?New or worsening migraines or headaches ?Seizures ?Stroke--sudden numbness or weakness of the face, arm, or leg, trouble speaking, confusion, trouble walking, loss of balance or coordination, dizziness, severe headache, change in vision ?Unusual vaginal discharge, itching, or odor ?Worsening mood, feelings of depression ?Side effects that usually do not require medical attention (report to your care team if they continue or are bothersome): ?Breast pain or tenderness ?Dark patches of the skin on the face or other sun-exposed areas ?Irregular menstrual cycles or spotting ?Nausea ?Weight gain ?This list may not describe all possible side effects. Call your doctor for medical advice about side effects. You may report side effects to FDA at 1-800-FDA-1088. ?Where should I keep my medication? ?This injection is only given by a care team. It will not be stored at home. ?NOTE: This sheet is a summary. It may not cover all possible information. If you have questions about this medicine, talk to your doctor, pharmacist, or health care provider. ?? 2023 Elsevier/Gold Standard (2020-10-21 00:00:00) ? ?

## 2022-04-24 MED ORDER — TRIAMCINOLONE ACETONIDE 0.5 % EX OINT: 1.0000 | TOPICAL_OINTMENT | Freq: Two times a day (BID) | CUTANEOUS | 2 refills | Status: DC

## 2022-06-05 DIAGNOSIS — L819 Disorder of pigmentation, unspecified: Secondary | ICD-10-CM | POA: Diagnosis not present

## 2022-06-09 ENCOUNTER — Encounter: Payer: Self-pay | Admitting: Certified Nurse Midwife

## 2022-07-17 NOTE — Progress Notes (Addendum)
Date last pap: 10/27/2019. Last Depo-Provera: 04/22/22. Side Effects if any: none. Serum HCG indicated? N/A Depo-Provera 150 mg IM given by: Cornelius Moras CMA. Next appointment due 10/10/22.

## 2022-07-18 ENCOUNTER — Ambulatory Visit (INDEPENDENT_AMBULATORY_CARE_PROVIDER_SITE_OTHER): Payer: Medicaid Other

## 2022-07-18 VITALS — BP 136/93 | HR 64 | Ht 68.0 in | Wt 172.0 lb

## 2022-07-18 DIAGNOSIS — Z30013 Encounter for initial prescription of injectable contraceptive: Secondary | ICD-10-CM

## 2022-07-18 DIAGNOSIS — Z3042 Encounter for surveillance of injectable contraceptive: Secondary | ICD-10-CM

## 2022-07-18 MED ORDER — MEDROXYPROGESTERONE ACETATE 150 MG/ML IM SUSP
150.0000 mg | Freq: Once | INTRAMUSCULAR | Status: AC
  Administered 2022-07-18: 150 mg via INTRAMUSCULAR

## 2022-07-30 NOTE — Addendum Note (Signed)
Addended by: Cornelius Moras D on: 07/30/2022 10:25 AM   Modules accepted: Level of Service

## 2022-10-09 NOTE — Progress Notes (Signed)
Date last pap: 10/27/19. Last Depo-Provera: 07/18/22. Side Effects if any: no. Serum HCG indicated? no. Depo-Provera 150 mg IM given by: Quintella Baton cma. Next appointment due April 26 - May 10 th.

## 2022-10-10 ENCOUNTER — Ambulatory Visit (INDEPENDENT_AMBULATORY_CARE_PROVIDER_SITE_OTHER): Payer: Medicaid Other

## 2022-10-10 VITALS — BP 120/80 | Ht 68.0 in | Wt 172.0 lb

## 2022-10-10 DIAGNOSIS — Z30013 Encounter for initial prescription of injectable contraceptive: Secondary | ICD-10-CM

## 2022-10-10 MED ORDER — MEDROXYPROGESTERONE ACETATE 150 MG/ML IM SUSP
150.0000 mg | Freq: Once | INTRAMUSCULAR | Status: AC
  Administered 2022-10-10: 150 mg via INTRAMUSCULAR

## 2022-10-10 NOTE — Patient Instructions (Signed)
Contraceptive Injection A contraceptive injection is a shot that prevents pregnancy. It is also called a birth control shot. The shot contains the hormone progestin, which prevents pregnancy by: Stopping the ovaries from releasing eggs. Thickening cervical mucus to prevent sperm from entering the cervix. Thinning the lining of the uterus to prevent a fertilized egg from attaching to the uterus. Contraceptive injections are given under the skin (subcutaneous) or into a muscle (intramuscular). For these shots to work, you must get one of them every 3 months (12-13 weeks) from a health care provider. Tell a health care provider about: Any allergies you have. All medicines you are taking, including vitamins, herbs, eye drops, creams, and over-the-counter medicines. Any blood disorders you have. Any medical conditions you have. Whether you are pregnant or may be pregnant. What are the risks? Generally, this is a safe procedure. However, problems may occur, including: Mood changes or depression. Loss of bone density (osteoporosis) after long-term use. Blood clots. This is rare. Higher risk of an egg being fertilized outside your uterus (ectopic pregnancy).This is rare. What happens before the procedure? Your health care provider may do a routine physical exam. You may have a test to make sure you are not pregnant. What happens during the procedure?  The area where the shot will be given will be cleaned and sanitized with alcohol. A needle will be inserted into a muscle in your upper arm or buttock, or into the skin of your thigh or abdomen. The needle will be attached to a syringe with the medicine inside of it. The medicine will be pushed through the syringe and injected into your body. A small bandage (dressing) may be placed over the injection site. What can I expect after the procedure? After the procedure, it is common to have: Soreness around the injection site for a couple of  days. Irregular menstrual bleeding. Weight gain. Breast tenderness. Headaches. Discomfort in your abdomen. Ask your health care provider whether you need to use an added method of birth control (backup contraception), such as a condom, sponge, or spermicide. If the first shot is given 1-7 days after the start of your last menstrual period, you will not need backup contraception. If the first shot is given at any other time during your menstrual cycle, you should avoid having sex. If you do have sex, you will need to use backup contraception for 7 days after you receive the shot. Follow these instructions at home: General instructions Take over-the-counter and prescription medicines only as told by your health care provider. Do not rub or massage the injection site. Track your menstrual periods so you will know if they become irregular. Always use a condom to protect against sexually transmitted infections (STIs). Make sure you schedule an appointment in time for your next shot and mark it on your calendar. You must get an injection every 3 months (12-13 weeks) to prevent pregnancy. Lifestyle Do not use any products that contain nicotine or tobacco. These products include cigarettes, chewing tobacco, and vaping devices, such as e-cigarettes. If you need help quitting, ask your health care provider. Eat foods that are high in calcium and vitamin D, such as milk, cheese, and salmon. Doing this may help with any loss in bone density caused by the contraceptive injection. Ask your health care provider for dietary recommendations. Contact a health care provider if you: Have nausea or vomiting. Have abnormal vaginal discharge or bleeding. Miss a menstrual period or think you might be pregnant. Experience mood changes  or depression. Feel dizzy or light-headed. Have leg pain. Get help right away if you: Have chest pain or cough up blood. Have shortness of breath. Have a severe headache that does  not go away. Have numbness in any part of your body. Have slurred speech or vision problems. Have vaginal bleeding that is abnormally heavy or does not stop, or you have severe pain in your abdomen. Have depression that does not get better with treatment. If you ever feel like you may hurt yourself or others, or have thoughts about taking your own life, get help right away. Go to your nearest emergency department or: Call your local emergency services (911 in the [place] Call a suicide crisis helpline, such as the [facility] at (437)073-4805 or 988 in the [place]. This is open 24 hours a day in the [place] Text the Crisis Text Line at (607)824-9156 (in the [place].). Summary A contraceptive injection is a shot that prevents pregnancy. It is also called the birth control shot. The shot is given under the skin (subcutaneous) or into a muscle (intramuscular). After this procedure, it is common to have soreness around the injection site for a couple of days. To prevent pregnancy, the shot must be given by a health care provider every 3 months (12-13 weeks). After you have the shot, ask your health care provider whether you need to use an added method of birth control (backup contraception), such as a condom, sponge, or spermicide. This information is not intended to replace advice given to you by your health care provider. Make sure you discuss any questions you have with your health care provider. Document Revised: 03/13/2021 Document Reviewed: 02/27/2020 Elsevier Patient Education  [place].

## 2023-01-02 ENCOUNTER — Ambulatory Visit (INDEPENDENT_AMBULATORY_CARE_PROVIDER_SITE_OTHER): Payer: Medicaid Other

## 2023-01-02 VITALS — BP 127/77 | HR 63 | Ht 68.0 in | Wt 172.0 lb

## 2023-01-02 DIAGNOSIS — Z3042 Encounter for surveillance of injectable contraceptive: Secondary | ICD-10-CM | POA: Diagnosis not present

## 2023-01-02 MED ORDER — MEDROXYPROGESTERONE ACETATE 150 MG/ML IM SUSY
150.0000 mg | PREFILLED_SYRINGE | Freq: Once | INTRAMUSCULAR | Status: AC
  Administered 2023-01-02: 150 mg via INTRAMUSCULAR

## 2023-01-02 NOTE — Patient Instructions (Signed)

## 2023-01-02 NOTE — Progress Notes (Signed)
    NURSE VISIT NOTE  Subjective:    Patient ID: Rosendo Gros, female    DOB: 1986/07/22, 37 y.o.   MRN: [id number]  HPI  Patient is a 37 y.o. [id number] female who presents for depo provera injection.   Objective:    BP 127/77   Pulse 63   Ht 5\' 8"  (1.727 m)   Wt 172 lb (78 kg)   Breastfeeding No   BMI 26.15 kg/m   Last Annual: 04/22/22. Last pap: 10/27/19. Last Depo-Provera: 10/10/22. Side Effects if any: none. Serum HCG indicated? No . Depo-Provera 150 mg IM given by: Donnetta Hail, CMA. Site: Right Deltoid   Assessment:   1. Encounter for surveillance of injectable contraceptive      Plan:   Next appointment due between July 19 and Aug 2.    Donnetta Hail, CMA

## 2023-03-04 ENCOUNTER — Telehealth: Payer: Self-pay

## 2023-03-04 DIAGNOSIS — K429 Umbilical hernia without obstruction or gangrene: Secondary | ICD-10-CM

## 2023-03-04 NOTE — Telephone Encounter (Signed)
Pt calling to see who we refer to for hernia removal.  [phone]  Pt states it is an umbilical hernia; adv will find out and call her back.  Pt asked about the procedure and the downtime; adv to ask the surgeon.

## 2023-03-04 NOTE — Telephone Encounter (Signed)
Pt aware General Surgery - Dr. Aleen Campi; pt requests a referral.

## 2023-03-06 NOTE — Telephone Encounter (Signed)
Referral has been placed per patient's request.   Dr. Valentino Saxon

## 2023-03-11 NOTE — Progress Notes (Signed)
Patient ID: Pamela [name], female   DOB: Apr 09, 1986, 37 y.o.   MRN: [id number]  Chief Complaint: Umbilical hernia for 14 years  History of Present Illness Pamela [name] is a 37 y.o. female with a longstanding umbilical defect that has recently become more painful with associated nausea.  She also reports increasing size of her bulge.  Exacerbated with certain activities.  No prior laparotomy, no prior hernia repair.  Past Medical History Past Medical History:  Diagnosis Date   Headache    History of recurrent UTIs    Panic attacks    Umbilical hernia       Past Surgical History:  Procedure Laterality Date   CESAREAN SECTION  12/01/2008   CESAREAN SECTION N/A 07/22/2017   Procedure: REPEAT CESAREAN SECTION (Female, 0948, 9lbs 11oz);  Surgeon: Linzie Collin, MD;  Location: [facility];  Service: Obstetrics;  Laterality: N/A;   CESAREAN SECTION N/A 03/17/2022   Procedure: CESAREAN SECTION;  Surgeon: Hildred Laser, MD;  Location: [facility];  Service: Obstetrics;  Laterality: N/A;   DILATION AND CURETTAGE OF UTERUS     DILATION AND EVACUATION N/A 08/07/2015   Procedure: DILATATION AND EVACUATION;  Surgeon: Allie Bossier, MD;  Location: [facility];  Service: Gynecology;  Laterality: N/A;   DILATION AND EVACUATION N/A 07/08/2019   Procedure: DILATATION AND EVACUATION;  Surgeon: Hildred Laser, MD;  Location: [facility];  Service: Gynecology;  Laterality: N/A;    Allergies  Allergen Reactions   Tramadol Itching   Chlorhexidine Itching    Current Outpatient Medications  Medication Sig Dispense Refill   ibuprofen (ADVIL) 600 MG tablet Take 1 tablet (600 mg total) by mouth every 6 (six) hours as needed. 60 tablet 1   medroxyPROGESTERone (DEPO-PROVERA) 150 MG/ML injection Inject 1 mL (150 mg total) into the muscle every 3 (three) months. 1 mL 3   No current facility-administered medications for this visit.    Family History Family History  Problem Relation Age of Onset   Healthy Mother     Healthy Father    Alcohol abuse Neg Hx    Arthritis Neg Hx    Asthma Neg Hx    Birth defects Neg Hx    Cancer Neg Hx    COPD Neg Hx    Depression Neg Hx    Diabetes Neg Hx    Drug abuse Neg Hx    Early death Neg Hx    Hearing loss Neg Hx    Heart disease Neg Hx    Hyperlipidemia Neg Hx    Kidney disease Neg Hx    Learning disabilities Neg Hx    Mental illness Neg Hx    Mental retardation Neg Hx    Miscarriages / Stillbirths Neg Hx    Stroke Neg Hx    Vision loss Neg Hx    Varicose Veins Neg Hx       Social History Social History   Tobacco Use   Smoking status: Never    Passive exposure: Never   Smokeless tobacco: Never  Vaping Use   Vaping status: Never Used  Substance Use Topics   Alcohol use: Not Currently   Drug use: No        Review of Systems  Constitutional: Negative.   HENT: Negative.    Eyes: Negative.   Respiratory:  Positive for shortness of breath.   Cardiovascular: Negative.   Gastrointestinal:  Positive for abdominal pain, blood in stool and nausea.  Genitourinary: Negative.   Skin: Negative.  Neurological: Negative.   Psychiatric/Behavioral: Negative.       Physical Exam Blood pressure 122/76, pulse 98, height 5\' 8"  (1.727 m), weight 170 lb (77.1 kg), SpO2 99%, not currently breastfeeding. Last Weight  Most recent update: 03/12/2023  4:10 PM    Weight  77.1 kg (170 lb)             CONSTITUTIONAL: Well developed, and nourished, appropriately responsive and aware without distress.   EYES: Sclera non-icteric.   EARS, NOSE, MOUTH AND THROAT:  The oropharynx is clear. Oral mucosa is pink and moist.    Hearing is intact to voice.  NECK: Trachea is midline, and there is no jugular venous distension.  LYMPH NODES:  Lymph nodes in the neck are not appreciated. RESPIRATORY:  Lungs are clear, and breath sounds are equal bilaterally.  Normal respiratory effort without pathologic use of accessory muscles. CARDIOVASCULAR: Heart is regular in  rate and rhythm.   Well perfused.  GI: The abdomen is notable for a slightly complicated fascial defect, reducible with somewhat attenuated supraumbilical fascia, overall estimated defect size to be less than 3 cm, mild diastases on the cephalad aspect.  Otherwise soft, nontender, and nondistended. There were no palpable masses.  I did not appreciate hepatosplenomegaly. MUSCULOSKELETAL:  Symmetrical muscle tone appreciated in all four extremities.    SKIN: Skin turgor is normal. No pathologic skin lesions appreciated.  NEUROLOGIC:  Motor and sensation appear grossly normal.  Cranial nerves are grossly without defect. PSYCH:  Alert and oriented to person, place and time. Affect is appropriate for situation.  Data Reviewed I have personally reviewed what is currently available of the patient's imaging, recent labs and medical records.   Labs:     Latest Ref Rng & Units 03/25/2022    2:34 PM 03/18/2022    5:53 AM 03/17/2022    6:18 AM  CBC  WBC 4.0 - 10.5 K/uL 9.5  21.9  8.8   Hemoglobin 12.0 - 15.0 g/dL 16.1  09.6  04.5   Hematocrit 36.0 - 46.0 % 39.2  31.9  33.5   Platelets 150 - 400 K/uL 337  230  218       Latest Ref Rng & Units 03/25/2022    2:34 PM 10/15/2020   11:31 AM 07/07/2019   10:38 PM  CMP  Glucose 70 - 99 mg/dL 98  69  409   BUN 6 - 20 mg/dL 10  11  10    Creatinine 0.44 - 1.00 mg/dL 8.11  9.14  7.82   Sodium 135 - 145 mmol/L 137  140  137   Potassium 3.5 - 5.1 mmol/L 4.1  4.3  3.4   Chloride 98 - 111 mmol/L 106  102  100   CO2 22 - 32 mmol/L 25  25  25    Calcium 8.9 - 10.3 mg/dL 9.1  9.3  9.5   Total Protein 6.5 - 8.1 g/dL 6.9  7.5  8.1   Total Bilirubin 0.3 - 1.2 mg/dL 0.7  0.3  0.2   Alkaline Phos 38 - 126 U/L 124  58  81   AST 15 - 41 U/L 26  25  48   ALT 0 - 44 U/L 40  21  58       Imaging: Radiological images reviewed:   Within last 24 hrs: No results found.  Assessment    Anterior abdominal wall hernia defect, estimated fascial defect diameter less than 3  cm.  Reducible. Patient Active Problem List  Diagnosis Date Noted   Acute cystitis with hematuria 03/06/2022   Nipple discharge in female 07/23/2018   Umbilical hernia 01/28/2017   History of 2 cesarean sections 01/28/2017    Plan    Open umbilical hernia initial repair, estimated fascial defect less than 3 cm, reducible. 16109  I discussed possibility of incarceration, strangulation, enlargement in size over time, and the need for emergency surgery in the face of these.  Also reviewed the techniques of reduction should incarceration occur, and when unsuccessful to present to the ED.  Also discussed that surgery risks include recurrence which can be up to 30% in the case of complex hernias, use of prosthetic materials (mesh) and the increased risk of infection and the possible need for re-operation and removal of mesh, possibility of post-op SBO or ileus, and the risks of general anesthetic including heart attack, stroke, sudden death or some reaction to anesthetic medications. The patient, and those present, appear to understand the risks, any and all questions were answered to the patient's satisfaction.  No guarantees were ever expressed or implied.   Face-to-face time spent with the patient and accompanying care providers(if present) was 30 minutes, with more than 50% of the time spent counseling, educating, and coordinating care of the patient.    These notes generated with voice recognition software. I apologize for typographical errors.  Campbell Lerner M.D., FACS 03/13/2023, 12:16 PM

## 2023-03-11 NOTE — H&P (View-Only) (Signed)
Patient ID: Pamela [name], female   DOB: 1986-08-03, 37 y.o.   MRN: [id number]  Chief Complaint: Umbilical hernia for 14 years  History of Present Illness Pamela [name] is a 37 y.o. female with a longstanding umbilical defect that has recently become more painful with associated nausea.  She also reports increasing size of her bulge.  Exacerbated with certain activities.  No prior laparotomy, no prior hernia repair.  Past Medical History Past Medical History:  Diagnosis Date   Headache    History of recurrent UTIs    Panic attacks    Umbilical hernia       Past Surgical History:  Procedure Laterality Date   CESAREAN SECTION  12/01/2008   CESAREAN SECTION N/A 07/22/2017   Procedure: REPEAT CESAREAN SECTION (Female, 0948, 9lbs 11oz);  Surgeon: Linzie Collin, MD;  Location: [facility];  Service: Obstetrics;  Laterality: N/A;   CESAREAN SECTION N/A 03/17/2022   Procedure: CESAREAN SECTION;  Surgeon: Hildred Laser, MD;  Location: [facility];  Service: Obstetrics;  Laterality: N/A;   DILATION AND CURETTAGE OF UTERUS     DILATION AND EVACUATION N/A 08/07/2015   Procedure: DILATATION AND EVACUATION;  Surgeon: Allie Bossier, MD;  Location: [facility];  Service: Gynecology;  Laterality: N/A;   DILATION AND EVACUATION N/A 07/08/2019   Procedure: DILATATION AND EVACUATION;  Surgeon: Hildred Laser, MD;  Location: [facility];  Service: Gynecology;  Laterality: N/A;    Allergies  Allergen Reactions   Tramadol Itching   Chlorhexidine Itching    Current Outpatient Medications  Medication Sig Dispense Refill   ibuprofen (ADVIL) 600 MG tablet Take 1 tablet (600 mg total) by mouth every 6 (six) hours as needed. 60 tablet 1   medroxyPROGESTERone (DEPO-PROVERA) 150 MG/ML injection Inject 1 mL (150 mg total) into the muscle every 3 (three) months. 1 mL 3   No current facility-administered medications for this visit.    Family History Family History  Problem Relation Age of Onset   Healthy Mother     Healthy Father    Alcohol abuse Neg Hx    Arthritis Neg Hx    Asthma Neg Hx    Birth defects Neg Hx    Cancer Neg Hx    COPD Neg Hx    Depression Neg Hx    Diabetes Neg Hx    Drug abuse Neg Hx    Early death Neg Hx    Hearing loss Neg Hx    Heart disease Neg Hx    Hyperlipidemia Neg Hx    Kidney disease Neg Hx    Learning disabilities Neg Hx    Mental illness Neg Hx    Mental retardation Neg Hx    Miscarriages / Stillbirths Neg Hx    Stroke Neg Hx    Vision loss Neg Hx    Varicose Veins Neg Hx       Social History Social History   Tobacco Use   Smoking status: Never    Passive exposure: Never   Smokeless tobacco: Never  Vaping Use   Vaping status: Never Used  Substance Use Topics   Alcohol use: Not Currently   Drug use: No        Review of Systems  Constitutional: Negative.   HENT: Negative.    Eyes: Negative.   Respiratory:  Positive for shortness of breath.   Cardiovascular: Negative.   Gastrointestinal:  Positive for abdominal pain, blood in stool and nausea.  Genitourinary: Negative.   Skin: Negative.  Neurological: Negative.   Psychiatric/Behavioral: Negative.       Physical Exam Blood pressure 122/76, pulse 98, height 5\' 8"  (1.727 m), weight 170 lb (77.1 kg), SpO2 99%, not currently breastfeeding. Last Weight  Most recent update: 03/12/2023  4:10 PM    Weight  77.1 kg (170 lb)             CONSTITUTIONAL: Well developed, and nourished, appropriately responsive and aware without distress.   EYES: Sclera non-icteric.   EARS, NOSE, MOUTH AND THROAT:  The oropharynx is clear. Oral mucosa is pink and moist.    Hearing is intact to voice.  NECK: Trachea is midline, and there is no jugular venous distension.  LYMPH NODES:  Lymph nodes in the neck are not appreciated. RESPIRATORY:  Lungs are clear, and breath sounds are equal bilaterally.  Normal respiratory effort without pathologic use of accessory muscles. CARDIOVASCULAR: Heart is regular in  rate and rhythm.   Well perfused.  GI: The abdomen is notable for a slightly complicated fascial defect, reducible with somewhat attenuated supraumbilical fascia, overall estimated defect size to be less than 3 cm, mild diastases on the cephalad aspect.  Otherwise soft, nontender, and nondistended. There were no palpable masses.  I did not appreciate hepatosplenomegaly. MUSCULOSKELETAL:  Symmetrical muscle tone appreciated in all four extremities.    SKIN: Skin turgor is normal. No pathologic skin lesions appreciated.  NEUROLOGIC:  Motor and sensation appear grossly normal.  Cranial nerves are grossly without defect. PSYCH:  Alert and oriented to person, place and time. Affect is appropriate for situation.  Data Reviewed I have personally reviewed what is currently available of the patient's imaging, recent labs and medical records.   Labs:     Latest Ref Rng & Units 03/25/2022    2:34 PM 03/18/2022    5:53 AM 03/17/2022    6:18 AM  CBC  WBC 4.0 - 10.5 K/uL 9.5  21.9  8.8   Hemoglobin 12.0 - 15.0 g/dL 16.1  09.6  04.5   Hematocrit 36.0 - 46.0 % 39.2  31.9  33.5   Platelets 150 - 400 K/uL 337  230  218       Latest Ref Rng & Units 03/25/2022    2:34 PM 10/15/2020   11:31 AM 07/07/2019   10:38 PM  CMP  Glucose 70 - 99 mg/dL 98  69  409   BUN 6 - 20 mg/dL 10  11  10    Creatinine 0.44 - 1.00 mg/dL 8.11  9.14  7.82   Sodium 135 - 145 mmol/L 137  140  137   Potassium 3.5 - 5.1 mmol/L 4.1  4.3  3.4   Chloride 98 - 111 mmol/L 106  102  100   CO2 22 - 32 mmol/L 25  25  25    Calcium 8.9 - 10.3 mg/dL 9.1  9.3  9.5   Total Protein 6.5 - 8.1 g/dL 6.9  7.5  8.1   Total Bilirubin 0.3 - 1.2 mg/dL 0.7  0.3  0.2   Alkaline Phos 38 - 126 U/L 124  58  81   AST 15 - 41 U/L 26  25  48   ALT 0 - 44 U/L 40  21  58       Imaging: Radiological images reviewed:   Within last 24 hrs: No results found.  Assessment    Anterior abdominal wall hernia defect, estimated fascial defect diameter less than 3  cm.  Reducible. Patient Active Problem List  Diagnosis Date Noted   Acute cystitis with hematuria 03/06/2022   Nipple discharge in female 07/23/2018   Umbilical hernia 01/28/2017   History of 2 cesarean sections 01/28/2017    Plan    Open umbilical hernia initial repair, estimated fascial defect less than 3 cm, reducible. 16109  I discussed possibility of incarceration, strangulation, enlargement in size over time, and the need for emergency surgery in the face of these.  Also reviewed the techniques of reduction should incarceration occur, and when unsuccessful to present to the ED.  Also discussed that surgery risks include recurrence which can be up to 30% in the case of complex hernias, use of prosthetic materials (mesh) and the increased risk of infection and the possible need for re-operation and removal of mesh, possibility of post-op SBO or ileus, and the risks of general anesthetic including heart attack, stroke, sudden death or some reaction to anesthetic medications. The patient, and those present, appear to understand the risks, any and all questions were answered to the patient's satisfaction.  No guarantees were ever expressed or implied.   Face-to-face time spent with the patient and accompanying care providers(if present) was 30 minutes, with more than 50% of the time spent counseling, educating, and coordinating care of the patient.    These notes generated with voice recognition software. I apologize for typographical errors.  Campbell Lerner M.D., FACS 03/13/2023, 12:16 PM

## 2023-03-12 ENCOUNTER — Ambulatory Visit (INDEPENDENT_AMBULATORY_CARE_PROVIDER_SITE_OTHER): Payer: Medicaid Other | Admitting: Surgery

## 2023-03-12 ENCOUNTER — Encounter: Payer: Self-pay | Admitting: Surgery

## 2023-03-12 VITALS — BP 122/76 | HR 98 | Ht 68.0 in | Wt 170.0 lb

## 2023-03-12 DIAGNOSIS — K429 Umbilical hernia without obstruction or gangrene: Secondary | ICD-10-CM | POA: Diagnosis not present

## 2023-03-12 NOTE — Patient Instructions (Signed)
You have requested for your Umbilical Hernia be repaired. This will be scheduled with Dr. Rodenberg at [facility].   Please see your (blue)pre-care sheet for information. Our surgery scheduler will call you to verify surgery date and to go over information.   You will need to arrange to be off work for 1-2 weeks but will have to have a lifting restriction of no more than 15 lbs for 6 weeks following your surgery. If you have FMLA or disability paperwork that needs filled out you may drop this off at our office or this can be faxed to [phone].     Umbilical Hernia, Adult A hernia is a bulge of tissue that pushes through an opening between muscles. An umbilical hernia happens in the abdomen, near the belly button (umbilicus). The hernia may contain tissues from the small intestine, large intestine, or fatty tissue covering the intestines (omentum). Umbilical hernias in adults tend to get worse over time, and they require surgical treatment. There are several types of umbilical hernias. You may have: A hernia located just above or below the umbilicus (indirect hernia). This is the most common type of umbilical hernia in adults. A hernia that forms through an opening formed by the umbilicus (direct hernia). A hernia that comes and goes (reducible hernia). A reducible hernia may be visible only when you strain, lift something heavy, or cough. This type of hernia can be pushed back into the abdomen (reduced). A hernia that traps abdominal tissue inside the hernia (incarcerated hernia). This type of hernia cannot be reduced. A hernia that cuts off blood flow to the tissues inside the hernia (strangulated hernia). The tissues can start to die if this happens. This type of hernia requires emergency treatment.  What are the causes? An umbilical hernia happens when tissue inside the abdomen presses on a weak area of the abdominal muscles. What increases the risk? You may have  a greater risk of this condition if you: Are obese. Have had several pregnancies. Have a buildup of fluid inside your abdomen (ascites). Have had surgery that weakens the abdominal muscles.  What are the signs or symptoms? The main symptom of this condition is a painless bulge at or near the belly button. A reducible hernia may be visible only when you strain, lift something heavy, or cough. Other symptoms may include: Dull pain. A feeling of pressure.  Symptoms of a strangulated hernia may include: Pain that gets increasingly worse. Nausea and vomiting. Pain when pressing on the hernia. Skin over the hernia becoming red or purple. Constipation. Blood in the stool.  How is this diagnosed? This condition may be diagnosed based on: A physical exam. You may be asked to cough or strain while standing. These actions increase the pressure inside your abdomen and force the hernia through the opening in your muscles. Your health care provider may try to reduce the hernia by pressing on it. Your symptoms and medical history.  How is this treated? Surgery is the only treatment for an umbilical hernia. Surgery for a strangulated hernia is done as soon as possible. If you have a small hernia that is not incarcerated, you may need to lose weight before having surgery. Follow these instructions at home: Lose weight, if told by your health care provider. Do not try to push the hernia back in. Watch your hernia for any changes in color or size. Tell your health care provider if any changes occur. You may need to avoid activities   that increase pressure on your hernia. Do not lift anything that is heavier than 10 lb (4.5 kg) until your health care provider says that this is safe. Take over-the-counter and prescription medicines only as told by your health care provider. Keep all follow-up visits as told by your health care provider. This is important. Contact a health care provider if: Your hernia  gets larger. Your hernia becomes painful. Get help right away if: You develop sudden, severe pain near the area of your hernia. You have pain as well as nausea or vomiting. You have pain and the skin over your hernia changes color. You develop a fever. This information is not intended to replace advice given to you by your health care provider. Make sure you discuss any questions you have with your health care provider. Document Released: 01/18/2016 Document Revised: 04/20/2016 Document Reviewed: 01/18/2016 Elsevier Interactive Patient Education  2018 [employer]    

## 2023-03-13 ENCOUNTER — Telehealth: Payer: Self-pay | Admitting: Surgery

## 2023-03-13 ENCOUNTER — Encounter: Payer: Self-pay | Admitting: Surgery

## 2023-03-13 ENCOUNTER — Ambulatory Visit: Payer: Self-pay | Admitting: Surgery

## 2023-03-13 DIAGNOSIS — K429 Umbilical hernia without obstruction or gangrene: Secondary | ICD-10-CM

## 2023-03-13 NOTE — Telephone Encounter (Signed)
Patient has been advised of Pre-Admission date/time, and Surgery date at [facility].  Surgery Date: 04/01/23 Preadmission Testing Date: 03/24/23 (phone 1p-4p)  Patient has been made aware to call [phone], between 1-3:00pm the day before surgery, to find out what time to arrive for surgery.

## 2023-03-24 ENCOUNTER — Other Ambulatory Visit: Payer: Self-pay

## 2023-03-24 ENCOUNTER — Encounter
Admission: RE | Admit: 2023-03-24 | Discharge: 2023-03-24 | Disposition: A | Discharge status: Home / Self Care | Payer: Medicaid Other | Source: Ambulatory Visit | Attending: Surgery | Admitting: Surgery

## 2023-03-24 VITALS — Ht 68.0 in | Wt 170.0 lb

## 2023-03-24 DIAGNOSIS — Z01818 Encounter for other preprocedural examination: Secondary | ICD-10-CM

## 2023-03-24 NOTE — Patient Instructions (Addendum)
Your procedure is scheduled on: Wednesday 04/01/23 To find out your arrival time, please call [phone] between 1PM - 3PM on:  Tuesday 03/31/23  Report to the Registration Desk on the 1st floor of the [facility]. Free Valet parking is available.  If your arrival time is 6:00 am, do not arrive before that time as the [facility] entrance doors do not open until 6:00 am.  REMEMBER: Instructions that are not followed completely may result in serious medical risk, up to and including death; or upon the discretion of your surgeon and anesthesiologist your surgery may need to be rescheduled.  Do not eat food or drink any liquids after midnight the night before surgery.  No gum chewing or hard candies.  One week prior to surgery: Stop Anti-inflammatories (NSAIDS) such as Advil, Aleve, Ibuprofen, Motrin, Naproxen, Naprosyn and Aspirin based products such as Excedrin, Goody's Powder, BC Powder. You may however, continue to take Tylenol if needed for pain up until the day of surgery.  Stop ANY OVER THE COUNTER supplements or vitamins until after surgery. You can continue your probiotic.  Continue taking all prescribed medications.  TAKE ONLY THESE MEDICATIONS THE MORNING OF SURGERY WITH A SIP OF WATER:  none  No Alcohol for 24 hours before or after surgery.  No Smoking including e-cigarettes for 24 hours before surgery.  No chewable tobacco products for at least 6 hours before surgery.  No nicotine patches on the day of surgery.  Do not use any "recreational" drugs for at least a week (preferably 2 weeks) before your surgery.  Please be advised that the combination of cocaine and anesthesia may have negative outcomes, up to and including death. If you test positive for cocaine, your surgery will be cancelled.  On the morning of surgery brush your teeth with toothpaste and water, you may rinse your mouth with mouthwash if you wish. Do not swallow any toothpaste or mouthwash.  Use CHG  Soap or wipes as directed on instruction sheet. Shower daily for 5-6 days with Dial antibacterial soap.  Do not wear lotions, powders, or perfumes.   Do not shave body hair from the neck down 48 hours before surgery.  Wear comfortable clothing (specific to your surgery type) to the hospital.  Do not wear jewelry, make-up, hairpins, clips or nail polish.  Contact lenses, hearing aids and dentures may not be worn into surgery.  Do not bring valuables to the hospital. [facility] is not responsible for any missing/lost belongings or valuables.   Notify your doctor if there is any change in your medical condition (cold, fever, infection).  If you are being discharged the day of surgery, you will not be allowed to drive home. You will need a responsible individual to drive you home and stay with you for 24 hours after surgery.   If you are taking public transportation, you will need to have a responsible individual with you.  If you are being admitted to the hospital overnight, leave your suitcase in the car. After surgery it may be brought to your room.  In case of increased patient census, it may be necessary for you, the patient, to continue your postoperative care in the Same Day Surgery department.  After surgery, you can help prevent lung complications by doing breathing exercises.  Take deep breaths and cough every 1-2 hours. Your doctor may order a device called an Incentive Spirometer to help you take deep breaths. When coughing or sneezing, hold a pillow firmly against  your incision with both hands. This is called "splinting." Doing this helps protect your incision. It also decreases belly discomfort.  Surgery Visitation Policy:  Patients undergoing a surgery or procedure may have two family members or support persons with them as long as the person is not COVID-19 positive or experiencing its symptoms.   Inpatient Visitation:    Visiting hours are 7 a.m. to 8 p.m. Up to four  visitors are allowed at one time in a patient room. The visitors may rotate out with other people during the day. One designated support person (adult) may remain overnight.  Please call the Pre-admissions Testing Dept. at [phone] if you have any questions about these instructions.

## 2023-03-27 ENCOUNTER — Ambulatory Visit: Payer: Medicaid Other

## 2023-03-27 ENCOUNTER — Ambulatory Visit (INDEPENDENT_AMBULATORY_CARE_PROVIDER_SITE_OTHER): Payer: Medicaid Other

## 2023-03-27 VITALS — BP 119/71 | HR 62 | Resp 16 | Ht 68.0 in | Wt 172.6 lb

## 2023-03-27 DIAGNOSIS — Z3042 Encounter for surveillance of injectable contraceptive: Secondary | ICD-10-CM | POA: Diagnosis not present

## 2023-03-27 MED ORDER — MEDROXYPROGESTERONE ACETATE 150 MG/ML IM SUSP
150.0000 mg | Freq: Once | INTRAMUSCULAR | Status: AC
Start: 2023-03-27 — End: 2023-03-27
  Administered 2023-03-27: 150 mg via INTRAMUSCULAR

## 2023-03-27 NOTE — Patient Instructions (Signed)

## 2023-03-27 NOTE — Progress Notes (Signed)
    NURSE VISIT NOTE  Subjective:    Patient ID: Pamela [name], female    DOB: [birth year], 37 y.o.   MRN: [id number]  HPI  Patient is a 37 y.o. [id number] female who presents for depo provera injection.   Objective:    LMP  (LMP Unknown)   Last Annual: 04/02/2022. Last pap: 10/27/2019. Last Depo-Provera: 01/02/2023. Side Effects if any: none. Serum HCG indicated? No . Depo-Provera 150 mg IM given by: Santiago Bumpers, CMA. Site: Right Deltoid   Lab Review  @THIS  VISIT ONLY@  Assessment:   1. Encounter for surveillance of injectable contraceptive      Plan:   Next appointment due between October 11 and October 25.    Santiago Bumpers, CMA [facility]

## 2023-03-31 MED ORDER — FAMOTIDINE 20 MG PO TABS
20.0000 mg | ORAL_TABLET | Freq: Once | ORAL | Status: AC
  Administered 2023-04-01: 20 mg via ORAL

## 2023-03-31 MED ORDER — GABAPENTIN 300 MG PO CAPS
300.0000 mg | ORAL_CAPSULE | ORAL | Status: AC
  Administered 2023-04-01: 300 mg via ORAL

## 2023-03-31 MED ORDER — ACETAMINOPHEN 500 MG PO TABS
1000.0000 mg | ORAL_TABLET | ORAL | Status: AC
  Administered 2023-04-01: 1000 mg via ORAL

## 2023-03-31 MED ORDER — LACTATED RINGERS IV SOLN: INTRAVENOUS | Status: DC

## 2023-03-31 MED ORDER — CEFAZOLIN SODIUM-DEXTROSE 2-4 GM/100ML-% IV SOLN
2.0000 g | INTRAVENOUS | Status: AC
  Administered 2023-04-01: 2 g via INTRAVENOUS

## 2023-03-31 MED ORDER — CELECOXIB 200 MG PO CAPS
200.0000 mg | ORAL_CAPSULE | ORAL | Status: AC
  Administered 2023-04-01: 200 mg via ORAL

## 2023-03-31 MED ORDER — BUPIVACAINE LIPOSOME 1.3 % IJ SUSP: 20.0000 mL | Freq: Once | INTRAMUSCULAR | Status: DC

## 2023-04-01 ENCOUNTER — Encounter
Admission: RE | Disposition: A | Discharge status: Home / Self Care | Payer: Self-pay | Source: Home / Self Care | Attending: Surgery

## 2023-04-01 ENCOUNTER — Other Ambulatory Visit: Payer: Self-pay

## 2023-04-01 ENCOUNTER — Ambulatory Visit: Payer: Medicaid Other | Admitting: Urgent Care

## 2023-04-01 ENCOUNTER — Ambulatory Visit
Admission: RE | Admit: 2023-04-01 | Discharge: 2023-04-01 | Disposition: A | Discharge status: Home / Self Care | Payer: Medicaid Other | Attending: Surgery | Admitting: Surgery

## 2023-04-01 ENCOUNTER — Ambulatory Visit: Payer: Medicaid Other | Admitting: General Practice

## 2023-04-01 ENCOUNTER — Encounter: Payer: Self-pay | Admitting: Surgery

## 2023-04-01 DIAGNOSIS — K429 Umbilical hernia without obstruction or gangrene: Secondary | ICD-10-CM

## 2023-04-01 DIAGNOSIS — Z98891 History of uterine scar from previous surgery: Secondary | ICD-10-CM | POA: Insufficient documentation

## 2023-04-01 DIAGNOSIS — Z01818 Encounter for other preprocedural examination: Secondary | ICD-10-CM

## 2023-04-01 DIAGNOSIS — K420 Umbilical hernia with obstruction, without gangrene: Secondary | ICD-10-CM | POA: Diagnosis not present

## 2023-04-01 HISTORY — PX: UMBILICAL HERNIA REPAIR: SHX196

## 2023-04-01 LAB — POCT PREGNANCY, URINE: Preg Test, Ur: NEGATIVE

## 2023-04-01 SURGERY — REPAIR, HERNIA, UMBILICAL, ADULT
Anesthesia: General | Site: Abdomen

## 2023-04-01 MED ORDER — ONDANSETRON HCL 4 MG/2ML IJ SOLN
INTRAMUSCULAR | Status: DC | PRN
  Administered 2023-04-01: 4 mg via INTRAVENOUS

## 2023-04-01 MED ORDER — CELECOXIB 200 MG PO CAPS
ORAL_CAPSULE | ORAL | Status: AC
  Filled 2023-04-01: qty 1

## 2023-04-01 MED ORDER — BUPIVACAINE-EPINEPHRINE (PF) 0.25% -1:200000 IJ SOLN
INTRAMUSCULAR | Status: AC
  Filled 2023-04-01: qty 30

## 2023-04-01 MED ORDER — OXYCODONE HCL 5 MG PO TABS
ORAL_TABLET | ORAL | Status: AC
  Filled 2023-04-01: qty 1

## 2023-04-01 MED ORDER — LIDOCAINE HCL (PF) 2 % IJ SOLN
INTRAMUSCULAR | Status: AC
  Filled 2023-04-01: qty 5

## 2023-04-01 MED ORDER — FENTANYL CITRATE (PF) 100 MCG/2ML IJ SOLN
INTRAMUSCULAR | Status: AC
  Filled 2023-04-01: qty 2

## 2023-04-01 MED ORDER — NALOXONE HCL 0.4 MG/ML IJ SOLN
INTRAMUSCULAR | Status: AC
  Filled 2023-04-01: qty 1

## 2023-04-01 MED ORDER — SUGAMMADEX SODIUM 200 MG/2ML IV SOLN
INTRAVENOUS | Status: DC | PRN
  Administered 2023-04-01: 350 mg via INTRAVENOUS

## 2023-04-01 MED ORDER — HYDROCODONE-ACETAMINOPHEN 5-325 MG PO TABS: 1.0000 | ORAL_TABLET | Freq: Four times a day (QID) | ORAL | 0 refills | Status: DC | PRN

## 2023-04-01 MED ORDER — ESMOLOL HCL 100 MG/10ML IV SOLN
INTRAVENOUS | Status: AC
  Filled 2023-04-01: qty 10

## 2023-04-01 MED ORDER — PROPOFOL 10 MG/ML IV BOLUS
INTRAVENOUS | Status: AC
  Filled 2023-04-01: qty 20

## 2023-04-01 MED ORDER — OXYCODONE HCL 5 MG PO TABS
5.0000 mg | ORAL_TABLET | Freq: Once | ORAL | Status: AC | PRN
  Administered 2023-04-01: 5 mg via ORAL

## 2023-04-01 MED ORDER — FENTANYL CITRATE (PF) 100 MCG/2ML IJ SOLN
25.0000 ug | INTRAMUSCULAR | Status: DC | PRN
  Administered 2023-04-01 (×3): 25 ug via INTRAVENOUS

## 2023-04-01 MED ORDER — BUPIVACAINE-EPINEPHRINE 0.25% -1:200000 IJ SOLN
INTRAMUSCULAR | Status: DC | PRN
  Administered 2023-04-01: 20 mL

## 2023-04-01 MED ORDER — ROCURONIUM BROMIDE 100 MG/10ML IV SOLN
INTRAVENOUS | Status: DC | PRN
  Administered 2023-04-01: 60 mg via INTRAVENOUS

## 2023-04-01 MED ORDER — FENTANYL CITRATE (PF) 100 MCG/2ML IJ SOLN
INTRAMUSCULAR | Status: DC | PRN
  Administered 2023-04-01 (×4): 50 ug via INTRAVENOUS

## 2023-04-01 MED ORDER — 0.9 % SODIUM CHLORIDE (POUR BTL) OPTIME
TOPICAL | Status: DC | PRN
  Administered 2023-04-01: 500 mL

## 2023-04-01 MED ORDER — PROPOFOL 10 MG/ML IV BOLUS
INTRAVENOUS | Status: DC | PRN
  Administered 2023-04-01: 200 mg via INTRAVENOUS

## 2023-04-01 MED ORDER — NALOXONE HCL 0.4 MG/ML IJ SOLN
INTRAMUSCULAR | Status: DC | PRN
  Administered 2023-04-01: .04 mg via INTRAVENOUS
  Administered 2023-04-01: .08 ug via INTRAVENOUS
  Administered 2023-04-01 (×4): .08 mg via INTRAVENOUS

## 2023-04-01 MED ORDER — LACTATED RINGERS IV SOLN: INTRAVENOUS | Status: DC | PRN

## 2023-04-01 MED ORDER — CEFAZOLIN SODIUM-DEXTROSE 2-4 GM/100ML-% IV SOLN
INTRAVENOUS | Status: AC
  Filled 2023-04-01: qty 100

## 2023-04-01 MED ORDER — ACETAMINOPHEN 500 MG PO TABS
ORAL_TABLET | ORAL | Status: AC
  Filled 2023-04-01: qty 2

## 2023-04-01 MED ORDER — GABAPENTIN 300 MG PO CAPS
ORAL_CAPSULE | ORAL | Status: AC
  Filled 2023-04-01: qty 1

## 2023-04-01 MED ORDER — DEXAMETHASONE SODIUM PHOSPHATE 10 MG/ML IJ SOLN
INTRAMUSCULAR | Status: DC | PRN
  Administered 2023-04-01: 10 mg via INTRAVENOUS

## 2023-04-01 MED ORDER — ESMOLOL HCL 100 MG/10ML IV SOLN
INTRAVENOUS | Status: DC | PRN
  Administered 2023-04-01 (×3): 10 mg via INTRAVENOUS

## 2023-04-01 MED ORDER — FAMOTIDINE 20 MG PO TABS
ORAL_TABLET | ORAL | Status: AC
  Filled 2023-04-01: qty 1

## 2023-04-01 MED ORDER — LIDOCAINE HCL (CARDIAC) PF 100 MG/5ML IV SOSY
PREFILLED_SYRINGE | INTRAVENOUS | Status: DC | PRN
  Administered 2023-04-01: 50 mg via INTRAVENOUS

## 2023-04-01 MED ORDER — MIDAZOLAM HCL 2 MG/2ML IJ SOLN
INTRAMUSCULAR | Status: AC
  Filled 2023-04-01: qty 2

## 2023-04-01 MED ORDER — MIDAZOLAM HCL 2 MG/2ML IJ SOLN
INTRAMUSCULAR | Status: DC | PRN
  Administered 2023-04-01 (×2): 2 mg via INTRAVENOUS

## 2023-04-01 MED ORDER — OXYCODONE HCL 5 MG/5ML PO SOLN: 5.0000 mg | Freq: Once | ORAL | Status: AC | PRN

## 2023-04-01 SURGICAL SUPPLY — 33 items
ADH SKN CLS APL DERMABOND .7 (GAUZE/BANDAGES/DRESSINGS) ×1
APL PRP STRL LF DISP 70% ISPRP (MISCELLANEOUS) ×1
BLADE CLIPPER SURG (BLADE) IMPLANT
BLADE SURG 15 STRL LF DISP TIS (BLADE) ×1 IMPLANT
BLADE SURG 15 STRL SS (BLADE) ×1
CHLORAPREP W/TINT 26 (MISCELLANEOUS) ×1 IMPLANT
DERMABOND ADVANCED .7 DNX12 (GAUZE/BANDAGES/DRESSINGS) ×1 IMPLANT
DRAPE LAPAROTOMY 77X122 PED (DRAPES) ×1 IMPLANT
ELECT CAUTERY BLADE 6.4 (BLADE) ×1 IMPLANT
ELECT REM PT RETURN 9FT ADLT (ELECTROSURGICAL) ×1
ELECTRODE REM PT RTRN 9FT ADLT (ELECTROSURGICAL) ×1 IMPLANT
GAUZE 4X4 16PLY ~~LOC~~+RFID DBL (SPONGE) ×1 IMPLANT
GLOVE ORTHO TXT STRL SZ7.5 (GLOVE) ×1 IMPLANT
GOWN STRL REUS W/ TWL LRG LVL3 (GOWN DISPOSABLE) ×1 IMPLANT
GOWN STRL REUS W/ TWL XL LVL3 (GOWN DISPOSABLE) ×1 IMPLANT
GOWN STRL REUS W/TWL LRG LVL3 (GOWN DISPOSABLE) ×1
GOWN STRL REUS W/TWL XL LVL3 (GOWN DISPOSABLE) ×1
KIT TURNOVER KIT A (KITS) ×1 IMPLANT
MANIFOLD NEPTUNE II (INSTRUMENTS) ×1 IMPLANT
NDL HYPO 22X1.5 SAFETY MO (MISCELLANEOUS) ×1 IMPLANT
NEEDLE HYPO 22X1.5 SAFETY MO (MISCELLANEOUS) ×1 IMPLANT
NS IRRIG 500ML POUR BTL (IV SOLUTION) ×1 IMPLANT
PACK BASIN MINOR ARMC (MISCELLANEOUS) ×1 IMPLANT
SPIKE FLUID TRANSFER (MISCELLANEOUS) ×1 IMPLANT
SUT ETHIBOND 0 MO6 C/R (SUTURE) ×1 IMPLANT
SUT MNCRL 4-0 (SUTURE) ×1
SUT MNCRL 4-0 27XMFL (SUTURE) ×1
SUT VIC AB 3-0 SH 27 (SUTURE) ×1
SUT VIC AB 3-0 SH 27X BRD (SUTURE) ×1 IMPLANT
SUTURE MNCRL 4-0 27XMF (SUTURE) ×1 IMPLANT
SYR 10ML LL (SYRINGE) ×1 IMPLANT
TRAP FLUID SMOKE EVACUATOR (MISCELLANEOUS) ×1 IMPLANT
WATER STERILE IRR 500ML POUR (IV SOLUTION) ×1 IMPLANT

## 2023-04-01 NOTE — Anesthesia Postprocedure Evaluation (Signed)
Anesthesia Post Note  Patient: Pamela [name]  Procedure(s) Performed: HERNIA REPAIR UMBILICAL ADULT, open (Abdomen)  Patient location during evaluation: PACU Anesthesia Type: General Level of consciousness: awake and alert Pain management: pain level controlled Vital Signs Assessment: post-procedure vital signs reviewed and stable Respiratory status: spontaneous breathing, nonlabored ventilation, respiratory function stable and patient connected to nasal cannula oxygen Cardiovascular status: blood pressure returned to baseline and stable Postop Assessment: no apparent nausea or vomiting Anesthetic complications: no  No notable events documented.   Last Vitals:  Vitals:   04/01/23 1045 04/01/23 1100  BP: (!) 116/95 134/67  Pulse: (!) 115 91  Resp: (!) 21 19  Temp:    SpO2: 100% 100%    Last Pain:  Vitals:   04/01/23 1045  TempSrc:   PainSc: Asleep                 Stephanie Coup

## 2023-04-01 NOTE — Anesthesia Procedure Notes (Signed)
Procedure Name: Intubation Date/Time: 04/01/2023 7:45 AM  Performed by: Lily Lovings, CRNAPre-anesthesia Checklist: Patient identified, Patient being monitored, Timeout performed, Emergency Drugs available and Suction available Patient Re-evaluated:Patient Re-evaluated prior to induction Oxygen Delivery Method: Circle system utilized Preoxygenation: Pre-oxygenation with 100% oxygen Induction Type: IV induction Ventilation: Mask ventilation without difficulty Laryngoscope Size: 3 and McGraph Grade View: Grade I Tube type: Oral Tube size: 7.0 mm Number of attempts: 1 Airway Equipment and Method: Stylet Placement Confirmation: ETT inserted through vocal cords under direct vision, positive ETCO2 and breath sounds checked- equal and bilateral Secured at: 22 cm Tube secured with: Tape Dental Injury: Teeth and Oropharynx as per pre-operative assessment

## 2023-04-01 NOTE — Op Note (Signed)
Umbilical Hernia Repair  Pre-operative Diagnosis: Umbilical hernia  Post-operative Diagnosis: same  Surgeon: Campbell Lerner, MD FACS  Anesthesia: General   Findings: 1 cm fascial defect diameter    Estimated Blood Loss: 5 mL                 Specimens: sac, preperitoneal fat discarded.         Complications: none              Procedure Details  The patient was seen again in the Holding Room. The benefits, complications, treatment options, and expected outcomes were discussed with the patient. The risks of bleeding, infection, recurrence of symptoms, failure to resolve symptoms, bowel injury, mesh placement, mesh infection, any of which could require further surgery were reviewed with the patient. The likelihood of improving the patient's symptoms with return to their baseline status is good.  The patient and/or family concurred with the proposed plan, giving informed consent.  The patient was taken to Operating Room, identified, and the procedure verified.  A Time Out was held and the above information confirmed.  Prior to the induction of general anesthesia, antibiotic prophylaxis was administered. VTE prophylaxis was in place. General endotracheal anesthesia was then administered and tolerated well. After the induction, the abdomen was prepped with Chloraprep and draped in the sterile fashion. The patient was positioned in the supine position.  Incision was created with a scalpel over the hernia defect. Electrocautery was used to dissect through subcutaneous tissue, the hernia sac was opened excised, along with the incarcerated falciform fat. The hernia was measured.  I closed the hernia defect with interrupted 0 Ethibond sutures.   Incision was closed in a 2 layer fashion with 3-0 Vicryl to pexy the umbilical skin and 4-0 Monocryl. Dermabond was used to coat the skin. Marcaine quarter percent with epinephrine and lidocaine 1% was used to inject the site. Patient tolerated procedure  well and there were no immediate complications. Needle and laparotomy counts were correct   Campbell Lerner, M.D., [facility]  04/01/2023 ; 10:28 AM

## 2023-04-01 NOTE — Anesthesia Preprocedure Evaluation (Signed)
Anesthesia Evaluation  Patient identified by MRN, date of birth, ID band Patient awake    Reviewed: Allergy & Precautions, NPO status , Patient's Chart, lab work & pertinent test results  Airway Mallampati: III  TM Distance: >3 FB Neck ROM: full    Dental  (+) Dental Advidsory Given, Chipped   Pulmonary neg pulmonary ROS   Pulmonary exam normal        Cardiovascular negative cardio ROS Normal cardiovascular exam     Neuro/Psych  PSYCHIATRIC DISORDERS Anxiety     negative neurological ROS     GI/Hepatic negative GI ROS, Neg liver ROS,,,  Endo/Other  negative endocrine ROS    Renal/GU      Musculoskeletal   Abdominal   Peds  Hematology negative hematology ROS (+)   Anesthesia Other Findings Past Medical History: No date: Headache No date: History of recurrent UTIs No date: Panic attacks No date: Umbilical hernia  Past Surgical History: 12/01/2008: CESAREAN SECTION 07/22/2017: CESAREAN SECTION; N/A     Comment:  Procedure: REPEAT CESAREAN SECTION (Female, 0948, 9lbs               11oz);  Surgeon: Linzie Collin, MD;  Location: [facility]               ORS;  Service: Obstetrics;  Laterality: N/A; 03/17/2022: CESAREAN SECTION; N/A     Comment:  Procedure: CESAREAN SECTION;  Surgeon: Hildred Laser,               MD;  Location: [facility] ORS;  Service: Obstetrics;                Laterality: N/A; No date: DILATION AND CURETTAGE OF UTERUS 08/07/2015: DILATION AND EVACUATION; N/A     Comment:  Procedure: DILATATION AND EVACUATION;  Surgeon: Allie Bossier, MD;  Location: [facility] ORS;  Service: Gynecology;                Laterality: N/A; 07/08/2019: DILATION AND EVACUATION; N/A     Comment:  Procedure: DILATATION AND EVACUATION;  Surgeon: Hildred Laser, MD;  Location: [facility] ORS;  Service: Gynecology;                Laterality: N/A;  BMI    Body Mass Index: 26.24 kg/m       Reproductive/Obstetrics negative OB ROS                             Anesthesia Physical Anesthesia Plan  ASA: 1  Anesthesia Plan: General ETT   Post-op Pain Management:    Induction: Intravenous  PONV Risk Score and Plan: Ondansetron, Dexamethasone, Midazolam and Treatment may vary due to age or medical condition  Airway Management Planned: Oral ETT  Additional Equipment:   Intra-op Plan:   Post-operative Plan: Extubation in OR  Informed Consent: I have reviewed the patients History and Physical, chart, labs and discussed the procedure including the risks, benefits and alternatives for the proposed anesthesia with the patient or authorized representative who has indicated his/her understanding and acceptance.     Dental Advisory Given  Plan Discussed with: Anesthesiologist, CRNA and Surgeon  Anesthesia Plan Comments: (Patient consented for risks of anesthesia including but not limited to:  - adverse reactions to medications - damage to eyes, teeth, lips or  other oral mucosa - nerve damage due to positioning  - sore throat or hoarseness - Damage to heart, brain, nerves, lungs, other parts of body or loss of life  Patient voiced understanding.)       Anesthesia Quick Evaluation

## 2023-04-01 NOTE — Interval H&P Note (Signed)
History and Physical Interval Note:  04/01/2023 9:18 AM  Pamela [name]  has presented today for surgery, with the diagnosis of umbilical hernia.  The various methods of treatment have been discussed with the patient and family. After consideration of risks, benefits and other options for treatment, the patient has consented to  Procedure(s): HERNIA REPAIR UMBILICAL ADULT, open (N/A) as a surgical intervention.  The patient's history has been reviewed, patient examined, no change in status, stable for surgery.  I have reviewed the patient's chart and labs.  Questions were answered to the patient's satisfaction.     Campbell Lerner

## 2023-04-01 NOTE — Transfer of Care (Signed)
Immediate Anesthesia Transfer of Care Note  Patient: Pamela [name]  Procedure(s) Performed: HERNIA REPAIR UMBILICAL ADULT, open (Abdomen)  Patient Location: PACU  Anesthesia Type:General  Level of Consciousness: drowsy and patient cooperative  Airway & Oxygen Therapy: Patient Spontanous Breathing and Patient connected to nasal cannula oxygen  Post-op Assessment: Report given to RN and Patient moving all extremities  Post vital signs: Reviewed and stable  Last Vitals:  Vitals Value Taken Time  BP 69/49 04/01/23 1040  Temp    Pulse 115 04/01/23 1045  Resp 27 04/01/23 1046  SpO2 100 % 04/01/23 1045  Vitals shown include unfiled device data.  Last Pain:  Vitals:   04/01/23 0801  TempSrc: Oral  PainSc: 0-No pain         Complications: No notable events documented.

## 2023-04-01 NOTE — Discharge Instructions (Signed)

## 2023-04-02 ENCOUNTER — Other Ambulatory Visit: Payer: Self-pay | Admitting: Surgery

## 2023-04-02 ENCOUNTER — Telehealth: Payer: Self-pay | Admitting: *Deleted

## 2023-04-02 MED ORDER — IBUPROFEN 600 MG PO TABS: 600.0000 mg | ORAL_TABLET | Freq: Four times a day (QID) | ORAL | 1 refills | Status: AC | PRN

## 2023-04-02 MED ORDER — HYDROCODONE-ACETAMINOPHEN 5-325 MG PO TABS: 1.0000 | ORAL_TABLET | Freq: Four times a day (QID) | ORAL | 0 refills | Status: AC | PRN

## 2023-04-02 NOTE — Progress Notes (Signed)
Backorder at [facility]

## 2023-04-02 NOTE — Telephone Encounter (Signed)
Patient called and had surgery yesterday with Dr.Rodenberg umbilical hernia, she had hydrocodone called into her pharmacy at [facility] in [place] but they are on back order, she wanted to know if we can send it to [facility] in [place] and well as her ibuprofen. Please call and advise

## 2023-04-14 ENCOUNTER — Encounter: Payer: Medicaid Other | Admitting: Physician Assistant

## 2023-04-21 ENCOUNTER — Encounter: Payer: Medicaid Other | Admitting: Physician Assistant

## 2023-04-22 NOTE — Patient Instructions (Signed)
Preventive Care 21-36 Years Old, Female Preventive care refers to lifestyle choices and visits with your health care provider that can promote health and wellness. Preventive care visits are also called wellness exams. What can I expect for my preventive care visit? Counseling During your preventive care visit, your health care provider may ask about your: Medical history, including: Past medical problems. Family medical history. Pregnancy history. Current health, including: Menstrual cycle. Method of birth control. Emotional well-being. Home life and relationship well-being. Sexual activity and sexual health. Lifestyle, including: Alcohol, nicotine or tobacco, and drug use. Access to firearms. Diet, exercise, and sleep habits. Work and work environment. Sunscreen use. Safety issues such as seatbelt and bike helmet use. Physical exam Your health care provider may check your: Height and weight. These may be used to calculate your BMI (body mass index). BMI is a measurement that tells if you are at a healthy weight. Waist circumference. This measures the distance around your waistline. This measurement also tells if you are at a healthy weight and may help predict your risk of certain diseases, such as type 2 diabetes and high blood pressure. Heart rate and blood pressure. Body temperature. Skin for abnormal spots. What immunizations do I need?  Vaccines are usually given at various ages, according to a schedule. Your health care provider will recommend vaccines for you based on your age, medical history, and lifestyle or other factors, such as travel or where you work. What tests do I need? Screening Your health care provider may recommend screening tests for certain conditions. This may include: Pelvic exam and Pap test. Lipid and cholesterol levels. Diabetes screening. This is done by checking your blood sugar (glucose) after you have not eaten for a while (fasting). Hepatitis  B test. Hepatitis C test. HIV (human immunodeficiency virus) test. STI (sexually transmitted infection) testing, if you are at risk. BRCA-related cancer screening. This may be done if you have a family history of breast, ovarian, tubal, or peritoneal cancers. Talk with your health care provider about your test results, treatment options, and if necessary, the need for more tests. Follow these instructions at home: Eating and drinking  Eat a healthy diet that includes fresh fruits and vegetables, whole grains, lean protein, and low-fat dairy products. Take vitamin and mineral supplements as recommended by your health care provider. Do not drink alcohol if: Your health care provider tells you not to drink. You are pregnant, may be pregnant, or are planning to become pregnant. If you drink alcohol: Limit how much you have to 0-1 drink a day. Know how much alcohol is in your drink. In the [place] one drink equals one 12 oz bottle of beer (355 mL), one 5 oz glass of wine (148 mL), or one 1 oz glass of hard liquor (44 mL). Lifestyle Brush your teeth every morning and night with fluoride toothpaste. Floss one time each day. Exercise for at least 30 minutes 5 or more days each week. Do not use any products that contain nicotine or tobacco. These products include cigarettes, chewing tobacco, and vaping devices, such as e-cigarettes. If you need help quitting, ask your health care provider. Do not use drugs. If you are sexually active, practice safe sex. Use a condom or other form of protection to prevent STIs. If you do not wish to become pregnant, use a form of birth control. If you plan to become pregnant, see your health care provider for a prepregnancy visit. Find healthy ways to manage stress, such as: Meditation,   yoga, or listening to music. Journaling. Talking to a trusted person. Spending time with friends and family. Minimize exposure to UV radiation to reduce your risk of skin  cancer. Safety Always wear your seat belt while driving or riding in a vehicle. Do not drive: If you have been drinking alcohol. Do not ride with someone who has been drinking. If you have been using any mind-altering substances or drugs. While texting. When you are tired or distracted. Wear a helmet and other protective equipment during sports activities. If you have firearms in your house, make sure you follow all gun safety procedures. Seek help if you have been physically or sexually abused. What's next? Go to your health care provider once a year for an annual wellness visit. Ask your health care provider how often you should have your eyes and teeth checked. Stay up to date on all vaccines. This information is not intended to replace advice given to you by your health care provider. Make sure you discuss any questions you have with your health care provider. Document Revised: 02/13/2021 Document Reviewed: 02/13/2021 Elsevier Patient Education  2024 [employer] Breast Self-Awareness Breast self-awareness is knowing how your breasts look and feel. You need to: Check your breasts on a regular basis. Tell your doctor about any changes. Become familiar with the look and feel of your breasts. This can help you catch a breast problem while it is still small and can be treated. You should do breast self-exams even if you have breast implants. What you need: A mirror. A well-lit room. A pillow or other soft object. How to do a breast self-exam Follow these steps to do a breast self-exam: Look for changes  Take off all the clothes above your waist. Stand in front of a mirror in a room with good lighting. Put your hands down at your sides. Compare your breasts in the mirror. Look for any difference between them, such as: A difference in shape. A difference in size. Wrinkles, dips, and bumps in one breast and not the other. Look at each breast for changes in the skin, such  as: Redness. Scaly areas. Skin that has gotten thicker. Dimpling. Open sores (ulcers). Look for changes in your nipples, such as: Fluid coming out of a nipple. Fluid around a nipple. Bleeding. Dimpling. Redness. A nipple that looks pushed in (retracted), or that has changed position. Feel for changes Lie on your back. Feel each breast. To do this: Pick a breast to feel. Place a pillow under the shoulder closest to that breast. Put the arm closest to that breast behind your head. Feel the nipple area of that breast using the hand of your other arm. Feel the area with the pads of your three middle fingers by making small circles with your fingers. Use light, medium, and firm pressure. Continue the overlapping circles, moving downward over the breast. Keep making circles with your fingers. Stop when you feel your ribs. Start making circles with your fingers again, this time going upward until you reach your collarbone. Then, make circles outward across your breast and into your armpit area. Squeeze your nipple. Check for discharge and lumps. Repeat these steps to check your other breast. Sit or stand in the tub or shower. With soapy water on your skin, feel each breast the same way you did when you were lying down. Write down what you find Writing down what you find can help you remember what to tell your doctor. Write down: What is   normal for each breast. Any changes you find in each breast. These include: The kind of changes you find. A tender or painful breast. Any lump you find. Write down its size and where it is. When you last had your monthly period (menstrual cycle). General tips If you are breastfeeding, the best time to check your breasts is after you feed your baby or after you use a breast pump. If you get monthly bleeding, the best time to check your breasts is 5-7 days after your monthly cycle ends. With time, you will become comfortable with the self-exam. You will  also start to know if there are changes in your breasts. Contact a doctor if: You see a change in the shape or size of your breasts or nipples. You see a change in the skin of your breast or nipples, such as red or scaly skin. You have fluid coming from your nipples that is not normal. You find a new lump or thick area. You have breast pain. You have any concerns about your breast health. Summary Breast self-awareness includes looking for changes in your breasts and feeling for changes within your breasts. You should do breast self-awareness in front of a mirror in a well-lit room. If you get monthly periods (menstrual cycles), the best time to check your breasts is 5-7 days after your period ends. Tell your doctor about any changes you see in your breasts. Changes include changes in size, changes on the skin, painful or tender breasts, or fluid from your nipples that is not normal. This information is not intended to replace advice given to you by your health care provider. Make sure you discuss any questions you have with your health care provider. Document Revised: 01/23/2022 Document Reviewed: 06/20/2021 Elsevier Patient Education  2024 [employer]  

## 2023-04-22 NOTE — Progress Notes (Signed)
GYNECOLOGY ANNUAL PHYSICAL EXAM PROGRESS NOTE  Subjective:    Pamela [name] is a 37 y.o. [phone] female who presents for an annual exam.  The patient is sexually active. The patient participates in regular exercise: no. Has the patient ever been transfused or tattooed?: no. The patient reports that there is not domestic violence in her life.   The patient has the following complaints today. Has a history of breast nodules and nipple drainage after birth of her second child, occurred in the right breast. Was diagnosed with a breast infection that had to be drained. Now after delivery of her last child ~ 1 year ago is again noting similar symptoms on the left, with yellow blood tinged discharge and intermittent breast tenderness and feeling enlargement of her ducts.  Also reports having an umbilical hernia repair last month, but was unable to go to her post-op appointment. Desires to have it checked today.   Menstrual History: Menarche age: 17 No LMP recorded (lmp unknown). Patient has had an injection.     Gynecologic History:  Contraception: Depo-Provera injections History of STI's:  Last Pap: 10/27/2019. Results were: normal. Denies h/o abnormal pap smears. Last mammogram: Not age appropriate   OB History  Gravida Para Term Preterm AB Living  5 3 3  0 2 3  SAB IAB Ectopic Multiple Live Births  2 0 0 0 3    # Outcome Date GA Lbr Len/2nd Weight Sex Type Anes PTL Lv  5 Term 03/17/22 [redacted]w[redacted]d  8 lb 6 oz (3.8 kg) M CS-LTranv Spinal  LIV     Name: Sklar,BOY Joesphine     Apgar1: 9  Apgar5: 9  4 SAB 07/2019     SAB     3 Term 07/22/17 [redacted]w[redacted]d  9 lb 11.2 oz (4.4 kg) F CS-LTranv Spinal  LIV     Name: Creppel,GIRL Mairany     Apgar1: 9  Apgar5: 9  2 SAB 08/07/15 [redacted]w[redacted]d       ND  1 Term 12/01/08 [redacted]w[redacted]d  8 lb 11 oz (3.941 kg) F CS-LTranv EPI  LIV    Obstetric Comments  2016 (D&C)    Past Medical History:  Diagnosis Date   Headache    History of recurrent UTIs    Panic attacks     Umbilical hernia     Past Surgical History:  Procedure Laterality Date   CESAREAN SECTION  12/01/2008   CESAREAN SECTION N/A 07/22/2017   Procedure: REPEAT CESAREAN SECTION (Female, 0948, 9lbs 11oz);  Surgeon: Linzie Collin, MD;  Location: [facility];  Service: Obstetrics;  Laterality: N/A;   CESAREAN SECTION N/A 03/17/2022   Procedure: CESAREAN SECTION;  Surgeon: Hildred Laser, MD;  Location: [facility];  Service: Obstetrics;  Laterality: N/A;   DILATION AND CURETTAGE OF UTERUS     DILATION AND EVACUATION N/A 08/07/2015   Procedure: DILATATION AND EVACUATION;  Surgeon: Allie Bossier, MD;  Location: [facility];  Service: Gynecology;  Laterality: N/A;   DILATION AND EVACUATION N/A 07/08/2019   Procedure: DILATATION AND EVACUATION;  Surgeon: Hildred Laser, MD;  Location: [facility];  Service: Gynecology;  Laterality: N/A;   UMBILICAL HERNIA REPAIR N/A 04/01/2023   Procedure: HERNIA REPAIR UMBILICAL ADULT, open;  Surgeon: Campbell Lerner, MD;  Location: [facility];  Service: General;  Laterality: N/A;    Family History  Problem Relation Age of Onset   Healthy Mother    Healthy Father    Alcohol abuse Neg Hx  Arthritis Neg Hx    Asthma Neg Hx    Birth defects Neg Hx    Cancer Neg Hx    COPD Neg Hx    Depression Neg Hx    Diabetes Neg Hx    Drug abuse Neg Hx    Early death Neg Hx    Hearing loss Neg Hx    Heart disease Neg Hx    Hyperlipidemia Neg Hx    Kidney disease Neg Hx    Learning disabilities Neg Hx    Mental illness Neg Hx    Mental retardation Neg Hx    Miscarriages / Stillbirths Neg Hx    Stroke Neg Hx    Vision loss Neg Hx    Varicose Veins Neg Hx     Social History   Socioeconomic History   Marital status: Single    Spouse name: Not on file   Number of children: Not on file   Years of education: Not on file   Highest education level: Not on file  Occupational History   Not on file  Tobacco Use   Smoking status: Never    Passive exposure: Never   Smokeless  tobacco: Never  Vaping Use   Vaping status: Never Used  Substance and Sexual Activity   Alcohol use: Yes    Comment: occ   Drug use: No   Sexual activity: Yes    Birth control/protection: Injection  Other Topics Concern   Not on file  Social History Narrative   Not on file   Social Determinants of Health   Financial Resource Strain: Not on file  Food Insecurity: Not on file  Transportation Needs: Not on file  Physical Activity: Not on file  Stress: Not on file  Social Connections: Not on file  Intimate Partner Violence: Not At Risk (06/14/2019)   Humiliation, Afraid, Rape, and Kick questionnaire    Fear of Current or Ex-Partner: No    Emotionally Abused: No    Physically Abused: No    Sexually Abused: No    Current Outpatient Medications on File Prior to Visit  Medication Sig Dispense Refill   HYDROcodone-acetaminophen (NORCO/VICODIN) 5-325 MG tablet Take 1 tablet by mouth every 6 (six) hours as needed for moderate pain. (Patient not taking: Reported on 04/24/2023) 15 tablet 0   ibuprofen (ADVIL) 600 MG tablet Take 1 tablet (600 mg total) by mouth every 6 (six) hours as needed. (Patient not taking: Reported on 04/24/2023) 60 tablet 1   No current facility-administered medications on file prior to visit.    Allergies  Allergen Reactions   Tramadol Itching   Chlorhexidine Itching     Review of Systems Constitutional: negative for chills, fatigue, fevers and sweats Eyes: negative for irritation, redness and visual disturbance Ears, nose, mouth, throat, and face: negative for hearing loss, nasal congestion, snoring and tinnitus Respiratory: negative for asthma, cough, sputum Cardiovascular: negative for chest pain, dyspnea, exertional chest pressure/discomfort, irregular heart beat, palpitations and syncope Gastrointestinal: negative for abdominal pain, change in bowel habits, nausea and vomiting Genitourinary: negative for abnormal menstrual periods, genital lesions,  sexual problems and vaginal discharge, dysuria and urinary incontinence Integument/breast: negative for breast lump.  Positive for intermittent left breast tenderness and nipple discharge Hematologic/lymphatic: negative for bleeding and easy bruising Musculoskeletal:negative for back pain and muscle weakness Neurological: negative for dizziness, headaches, vertigo and weakness Endocrine: negative for diabetic symptoms including polydipsia, polyuria and skin dryness Allergic/Immunologic: negative for hay fever and urticaria  Objective:  Blood pressure 133/80, pulse 70, height 5\' 8"  (1.727 m), weight 166 lb (75.3 kg), not currently breastfeeding.  Body mass index is 25.24 kg/m.  General Appearance:    Alert, cooperative, no distress, appears stated age  Head:    Normocephalic, without obvious abnormality, atraumatic  Eyes:    PERRL, conjunctiva/corneas clear, EOM's intact, both eyes  Ears:    Normal external ear canals, both ears  Nose:   Nares normal, septum midline, mucosa normal, no drainage or sinus tenderness  Throat:   Lips, mucosa, and tongue normal; teeth and gums normal  Neck:   Supple, symmetrical, trachea midline, no adenopathy; thyroid: no enlargement/tenderness/nodules; no carotid bruit or JVD  Back:     Symmetric, no curvature, ROM normal, no CVA tenderness  Lungs:     Clear to auscultation bilaterally, respirations unlabored  Chest Wall:    No tenderness or deformity   Heart:    Regular rate and rhythm, S1 and S2 normal, no murmur, rub or gallop  Breast Exam:    No tenderness, possible fibrocystic changes noted bilaterally, no erythema masses, or nipple abnormality, no drainage.   Abdomen:     Soft, non-tender, bowel sounds active all four quadrants, no masses, no organomegaly.  Umbilical incision healing well, no drainage or erythema.   Genitalia:    Pelvic:external genitalia normal, vagina without lesions, discharge, or tenderness, rectovaginal septum  normal. Cervix  normal in appearance, no cervical motion tenderness, no adnexal masses or tenderness.  Uterus normal size, shape, mobile, regular contours, nontender.  Rectal:    Normal external sphincter.  No hemorrhoids appreciated. Internal exam not done.   Extremities:   Extremities normal, atraumatic, no cyanosis or edema  Pulses:   2+ and symmetric all extremities  Skin:   Skin color, texture, turgor normal, no rashes or lesions  Lymph nodes:   Cervical, supraclavicular, and axillary nodes normal  Neurologic:   CNII-XII intact, normal strength, sensation and reflexes throughout   .  Labs:  Lab Results  Component Value Date   WBC 9.5 03/25/2022   HGB 12.3 03/25/2022   HCT 39.2 03/25/2022   MCV 86.9 03/25/2022   PLT 337 03/25/2022    Lab Results  Component Value Date   CREATININE 0.79 03/25/2022   BUN 10 03/25/2022   NA 137 03/25/2022   K 4.1 03/25/2022   CL 106 03/25/2022   CO2 25 03/25/2022    Lab Results  Component Value Date   ALT 40 03/25/2022   AST 26 03/25/2022   ALKPHOS 124 03/25/2022   BILITOT 0.7 03/25/2022    Lab Results  Component Value Date   TSH 1.270 12/27/2020     Assessment:   1. Encounter for well woman exam with routine gynecological exam   2. Cervical cancer screening   3. Screening for diabetes mellitus (DM)   4. Screening cholesterol level   5. Mastitis of left breast unrelated to pregnancy or breastfeeding   6. Fibrocystic breast changes of both breasts   7. S/P umbilical hernia repair, follow-up exam      Plan:  - Blood tests: Ordered. - Breast self exam technique reviewed and patient encouraged to perform self-exam monthly. - Contraception: Depo-Provera injections. Is considering changing birth control methods. Would like for partner to get vasectomy but notes this is not likely to happen. Considering Nexplanon. Can place at time of next due Depo injection in 1 month.  - Discussed healthy lifestyle modifications. - Mammogram  Not age  appropriate -  Pap smear ordered. - Discussed possible intermittent mastitis of breast. Will treat  with Augmentin BID x 7 days when symptoms recur.  - Umbilical incision noted to be healing well today.  - Follow up in 1 year for annual exam   Hildred Laser, MD [facility]

## 2023-04-24 ENCOUNTER — Other Ambulatory Visit (HOSPITAL_COMMUNITY)
Admission: RE | Admit: 2023-04-24 | Discharge: 2023-04-24 | Disposition: A | Discharge status: Home / Self Care | Payer: Medicaid Other | Source: Ambulatory Visit | Attending: Obstetrics and Gynecology | Admitting: Obstetrics and Gynecology

## 2023-04-24 ENCOUNTER — Ambulatory Visit: Payer: Medicaid Other | Admitting: Obstetrics and Gynecology

## 2023-04-24 ENCOUNTER — Encounter: Payer: Self-pay | Admitting: Obstetrics and Gynecology

## 2023-04-24 VITALS — BP 133/80 | HR 70 | Ht 68.0 in | Wt 166.0 lb

## 2023-04-24 DIAGNOSIS — N6011 Diffuse cystic mastopathy of right breast: Secondary | ICD-10-CM

## 2023-04-24 DIAGNOSIS — N61 Mastitis without abscess: Secondary | ICD-10-CM

## 2023-04-24 DIAGNOSIS — Z124 Encounter for screening for malignant neoplasm of cervix: Secondary | ICD-10-CM | POA: Insufficient documentation

## 2023-04-24 DIAGNOSIS — Z131 Encounter for screening for diabetes mellitus: Secondary | ICD-10-CM | POA: Diagnosis not present

## 2023-04-24 DIAGNOSIS — Z09 Encounter for follow-up examination after completed treatment for conditions other than malignant neoplasm: Secondary | ICD-10-CM

## 2023-04-24 DIAGNOSIS — Z13220 Encounter for screening for lipoid disorders: Secondary | ICD-10-CM

## 2023-04-24 DIAGNOSIS — Z01419 Encounter for gynecological examination (general) (routine) without abnormal findings: Secondary | ICD-10-CM | POA: Insufficient documentation

## 2023-04-24 MED ORDER — AMOXICILLIN-POT CLAVULANATE 875-125 MG PO TABS: 1.0000 | ORAL_TABLET | Freq: Two times a day (BID) | ORAL | 1 refills | Status: AC

## 2023-04-25 ENCOUNTER — Encounter: Payer: Self-pay | Admitting: Obstetrics and Gynecology

## 2023-04-25 LAB — CBC
Hematocrit: 42.2 % (ref 34.0–46.6)
Hemoglobin: 13.8 g/dL (ref 11.1–15.9)
MCH: 28.8 pg (ref 26.6–33.0)
MCHC: 32.7 g/dL (ref 31.5–35.7)
MCV: 88 fL (ref 79–97)
Platelets: 280 10*3/uL (ref 150–450)
RBC: 4.8 x10E6/uL (ref 3.77–5.28)
RDW: 12.8 % (ref 11.7–15.4)
WBC: 7.6 10*3/uL (ref 3.4–10.8)

## 2023-04-25 LAB — COMPREHENSIVE METABOLIC PANEL
ALT: 48 IU/L — ABNORMAL HIGH (ref 0–32)
AST: 24 IU/L (ref 0–40)
Albumin: 4.4 g/dL (ref 3.9–4.9)
Alkaline Phosphatase: 64 IU/L (ref 44–121)
BUN/Creatinine Ratio: 8 — ABNORMAL LOW (ref 9–23)
BUN: 8 mg/dL (ref 6–20)
Bilirubin Total: 0.4 mg/dL (ref 0.0–1.2)
CO2: 22 mmol/L (ref 20–29)
Calcium: 9.9 mg/dL (ref 8.7–10.2)
Chloride: 104 mmol/L (ref 96–106)
Creatinine, Ser: 1.02 mg/dL — ABNORMAL HIGH (ref 0.57–1.00)
Globulin, Total: 3.3 g/dL (ref 1.5–4.5)
Glucose: 81 mg/dL (ref 70–99)
Potassium: 4.9 mmol/L (ref 3.5–5.2)
Sodium: 139 mmol/L (ref 134–144)
Total Protein: 7.7 g/dL (ref 6.0–8.5)
eGFR: 73 mL/min/{1.73_m2} (ref >59)

## 2023-04-25 LAB — HEMOGLOBIN A1C
Est. average glucose Bld gHb Est-mCnc: 120 mg/dL
Hgb A1c MFr Bld: 5.8 % — ABNORMAL HIGH (ref 4.8–5.6)

## 2023-04-25 LAB — LIPID PANEL
Chol/HDL Ratio: 3.1 ratio (ref 0.0–4.4)
Cholesterol, Total: 165 mg/dL (ref 100–199)
HDL: 53 mg/dL (ref >39)
LDL Chol Calc (NIH): 96 mg/dL (ref 0–99)
Triglycerides: 88 mg/dL (ref 0–149)
VLDL Cholesterol Cal: 16 mg/dL (ref 5–40)

## 2023-04-25 LAB — TSH: TSH: 1.97 u[IU]/mL (ref 0.450–4.500)

## 2023-04-29 LAB — CYTOLOGY - PAP
Comment: NEGATIVE
Diagnosis: NEGATIVE
High risk HPV: NEGATIVE

## 2023-05-01 DIAGNOSIS — N6325 Unspecified lump in the left breast, overlapping quadrants: Secondary | ICD-10-CM | POA: Diagnosis not present

## 2023-06-26 ENCOUNTER — Ambulatory Visit: Payer: Medicaid Other

## 2023-06-29 ENCOUNTER — Ambulatory Visit: Payer: Medicaid Other | Admitting: Obstetrics & Gynecology

## 2023-06-29 VITALS — BP 130/86 | HR 67 | Ht 68.0 in | Wt 175.1 lb

## 2023-06-29 DIAGNOSIS — Z30017 Encounter for initial prescription of implantable subdermal contraceptive: Secondary | ICD-10-CM

## 2023-06-29 DIAGNOSIS — Z3202 Encounter for pregnancy test, result negative: Secondary | ICD-10-CM | POA: Diagnosis not present

## 2023-06-29 DIAGNOSIS — Z30013 Encounter for initial prescription of injectable contraceptive: Secondary | ICD-10-CM | POA: Diagnosis not present

## 2023-06-29 LAB — POCT URINE PREGNANCY: Preg Test, Ur: NEGATIVE

## 2023-06-29 MED ORDER — MEDROXYPROGESTERONE ACETATE 150 MG/ML IM SUSY
150.0000 mg | PREFILLED_SYRINGE | Freq: Once | INTRAMUSCULAR | Status: AC
Start: 2023-06-29 — End: 2023-06-29
  Administered 2023-06-29: 150 mg via INTRAMUSCULAR

## 2023-06-29 NOTE — Progress Notes (Unsigned)
GYNECOLOGY PROGRESS NOTE  Subjective:    Patient ID: Rosendo Gros, female    DOB: 11/04/1985, 37 y.o.   MRN: [id number]  HPI  Patient is a 37 y.o. [id number] who presents for discussion about contraception. She has used depo provera for the past 14 years between her pregnancies. She says that her husband declines vasectomy and she is not ready for a bilateral salpingectomy. She says that she does not want an iud and that she would not be good about taking a pill daily. She had nexplanon about 13 years ago and only kept it in for 1 month. She can't remember why she had it removed.  The following portions of the patient's history were reviewed and updated as appropriate: allergies, current medications, past family history, past medical history, past social history, past surgical history, and problem list.  Review of Systems  Pap normal this year  Objective:   Height 5\' 8"  (1.727 m), weight 175 lb 1.6 oz (79.4 kg), not currently breastfeeding. Body mass index is 26.62 kg/m. Well nourished, well hydrated female, no apparent distress She is ambulating and conversing normally.    Assessment:  Contraception - Depo provera at present, discussed bilateral salpingectomy Elevated A1C  Plan:  Come back for annual in a year/prn sooner Depo injection q 13 weeks. Rec that she see her primary care about A1C and slightly elevated creatinine

## 2023-09-14 DIAGNOSIS — N6315 Unspecified lump in the right breast, overlapping quadrants: Secondary | ICD-10-CM | POA: Diagnosis not present

## 2023-09-14 DIAGNOSIS — N6331 Unspecified lump in axillary tail of the right breast: Secondary | ICD-10-CM | POA: Diagnosis not present

## 2023-09-16 DIAGNOSIS — R928 Other abnormal and inconclusive findings on diagnostic imaging of breast: Secondary | ICD-10-CM | POA: Diagnosis not present

## 2023-09-16 DIAGNOSIS — L02411 Cutaneous abscess of right axilla: Secondary | ICD-10-CM | POA: Diagnosis not present

## 2023-09-16 DIAGNOSIS — N6001 Solitary cyst of right breast: Secondary | ICD-10-CM | POA: Diagnosis not present

## 2024-07-26 DIAGNOSIS — Z23 Encounter for immunization: Secondary | ICD-10-CM | POA: Diagnosis not present
